# Patient Record
Sex: Female | Born: 1940 | Race: White | Hispanic: No | Smoking: Never smoker
Health system: Southern US, Community
[De-identification: ages and names within clinical notes are randomized; demographics above are authoritative.]

## PROBLEM LIST (undated history)

## (undated) DIAGNOSIS — M1990 Unspecified osteoarthritis, unspecified site: Secondary | ICD-10-CM

## (undated) DIAGNOSIS — E785 Hyperlipidemia, unspecified: Secondary | ICD-10-CM

## (undated) DIAGNOSIS — I480 Paroxysmal atrial fibrillation: Secondary | ICD-10-CM

## (undated) DIAGNOSIS — I2510 Atherosclerotic heart disease of native coronary artery without angina pectoris: Secondary | ICD-10-CM

## (undated) DIAGNOSIS — G4730 Sleep apnea, unspecified: Secondary | ICD-10-CM

## (undated) DIAGNOSIS — I739 Peripheral vascular disease, unspecified: Secondary | ICD-10-CM

## (undated) DIAGNOSIS — I10 Essential (primary) hypertension: Secondary | ICD-10-CM

## (undated) DIAGNOSIS — J189 Pneumonia, unspecified organism: Secondary | ICD-10-CM

## (undated) HISTORY — DX: Paroxysmal atrial fibrillation: I48.0

## (undated) HISTORY — DX: Peripheral vascular disease, unspecified: I73.9

## (undated) HISTORY — PX: CARDIAC CATHETERIZATION: SHX172

## (undated) HISTORY — DX: Atherosclerotic heart disease of native coronary artery without angina pectoris: I25.10

---

## 1996-06-18 HISTORY — PX: ABDOMINAL HYSTERECTOMY: SHX81

## 2000-12-17 ENCOUNTER — Inpatient Hospital Stay (HOSPITAL_COMMUNITY): Admission: AD | Admit: 2000-12-17 | Discharge: 2000-12-28 | Payer: Self-pay | Admitting: Family Medicine

## 2000-12-18 ENCOUNTER — Encounter (INDEPENDENT_AMBULATORY_CARE_PROVIDER_SITE_OTHER): Payer: Self-pay | Admitting: Specialist

## 2000-12-23 ENCOUNTER — Encounter: Payer: Self-pay | Admitting: Thoracic Surgery (Cardiothoracic Vascular Surgery)

## 2000-12-23 HISTORY — PX: CORONARY ARTERY BYPASS GRAFT: SHX141

## 2000-12-24 ENCOUNTER — Encounter: Payer: Self-pay | Admitting: Thoracic Surgery (Cardiothoracic Vascular Surgery)

## 2000-12-25 ENCOUNTER — Encounter: Payer: Self-pay | Admitting: Thoracic Surgery (Cardiothoracic Vascular Surgery)

## 2000-12-26 ENCOUNTER — Encounter: Payer: Self-pay | Admitting: Thoracic Surgery (Cardiothoracic Vascular Surgery)

## 2000-12-27 ENCOUNTER — Encounter: Payer: Self-pay | Admitting: Thoracic Surgery (Cardiothoracic Vascular Surgery)

## 2001-01-08 ENCOUNTER — Encounter: Payer: Self-pay | Admitting: Cardiology

## 2001-01-08 ENCOUNTER — Ambulatory Visit (HOSPITAL_COMMUNITY): Admission: RE | Admit: 2001-01-08 | Discharge: 2001-01-08 | Payer: Self-pay | Admitting: Cardiology

## 2001-01-24 ENCOUNTER — Encounter: Payer: Self-pay | Admitting: Family Medicine

## 2001-01-24 ENCOUNTER — Ambulatory Visit (HOSPITAL_COMMUNITY): Admission: RE | Admit: 2001-01-24 | Discharge: 2001-01-24 | Payer: Self-pay | Admitting: Family Medicine

## 2001-01-29 ENCOUNTER — Encounter
Admission: RE | Admit: 2001-01-29 | Discharge: 2001-01-29 | Payer: Self-pay | Admitting: Thoracic Surgery (Cardiothoracic Vascular Surgery)

## 2001-01-29 ENCOUNTER — Encounter: Payer: Self-pay | Admitting: Thoracic Surgery (Cardiothoracic Vascular Surgery)

## 2001-07-22 ENCOUNTER — Encounter: Payer: Self-pay | Admitting: Family Medicine

## 2001-07-22 ENCOUNTER — Encounter: Admission: RE | Admit: 2001-07-22 | Discharge: 2001-07-22 | Payer: Self-pay | Admitting: Family Medicine

## 2002-02-11 ENCOUNTER — Encounter: Payer: Self-pay | Admitting: Vascular Surgery

## 2002-02-13 ENCOUNTER — Ambulatory Visit (HOSPITAL_COMMUNITY): Admission: RE | Admit: 2002-02-13 | Discharge: 2002-02-14 | Payer: Self-pay | Admitting: Vascular Surgery

## 2002-04-16 ENCOUNTER — Encounter: Payer: Self-pay | Admitting: Family Medicine

## 2002-04-16 ENCOUNTER — Ambulatory Visit (HOSPITAL_COMMUNITY): Admission: RE | Admit: 2002-04-16 | Discharge: 2002-04-16 | Payer: Self-pay | Admitting: Family Medicine

## 2003-05-04 ENCOUNTER — Ambulatory Visit (HOSPITAL_COMMUNITY): Admission: RE | Admit: 2003-05-04 | Discharge: 2003-05-04 | Payer: Self-pay | Admitting: Family Medicine

## 2003-10-29 ENCOUNTER — Emergency Department (HOSPITAL_COMMUNITY): Admission: EM | Admit: 2003-10-29 | Discharge: 2003-10-29 | Payer: Self-pay | Admitting: *Deleted

## 2004-10-26 ENCOUNTER — Ambulatory Visit (HOSPITAL_COMMUNITY): Admission: RE | Admit: 2004-10-26 | Discharge: 2004-10-26 | Payer: Self-pay | Admitting: Family Medicine

## 2004-11-22 ENCOUNTER — Ambulatory Visit: Payer: Self-pay | Admitting: Cardiology

## 2004-12-01 ENCOUNTER — Ambulatory Visit (HOSPITAL_COMMUNITY): Admission: RE | Admit: 2004-12-01 | Discharge: 2004-12-01 | Payer: Self-pay | Admitting: Cardiology

## 2004-12-01 ENCOUNTER — Ambulatory Visit: Payer: Self-pay | Admitting: Cardiology

## 2004-12-22 ENCOUNTER — Ambulatory Visit: Payer: Self-pay | Admitting: Cardiology

## 2005-02-06 ENCOUNTER — Ambulatory Visit: Payer: Self-pay | Admitting: Cardiology

## 2005-07-02 ENCOUNTER — Ambulatory Visit (HOSPITAL_COMMUNITY): Admission: RE | Admit: 2005-07-02 | Discharge: 2005-07-02 | Payer: Self-pay | Admitting: Family Medicine

## 2005-08-29 ENCOUNTER — Emergency Department (HOSPITAL_COMMUNITY): Admission: EM | Admit: 2005-08-29 | Discharge: 2005-08-29 | Payer: Self-pay | Admitting: Emergency Medicine

## 2006-07-29 ENCOUNTER — Ambulatory Visit (HOSPITAL_COMMUNITY): Admission: RE | Admit: 2006-07-29 | Discharge: 2006-07-29 | Payer: Self-pay | Admitting: Family Medicine

## 2007-08-11 ENCOUNTER — Ambulatory Visit (HOSPITAL_COMMUNITY): Admission: RE | Admit: 2007-08-11 | Discharge: 2007-08-11 | Payer: Self-pay | Admitting: Family Medicine

## 2007-09-13 ENCOUNTER — Ambulatory Visit: Payer: Self-pay | Admitting: Cardiology

## 2007-09-13 ENCOUNTER — Encounter: Payer: Self-pay | Admitting: Emergency Medicine

## 2007-09-13 ENCOUNTER — Ambulatory Visit: Payer: Self-pay | Admitting: *Deleted

## 2007-09-14 ENCOUNTER — Inpatient Hospital Stay (HOSPITAL_COMMUNITY): Admission: RE | Admit: 2007-09-14 | Discharge: 2007-09-14 | Payer: Self-pay | Admitting: Emergency Medicine

## 2007-09-30 ENCOUNTER — Ambulatory Visit: Payer: Self-pay | Admitting: Cardiology

## 2007-09-30 ENCOUNTER — Encounter (HOSPITAL_COMMUNITY): Admission: RE | Admit: 2007-09-30 | Discharge: 2007-10-30 | Payer: Self-pay | Admitting: Internal Medicine

## 2007-11-03 ENCOUNTER — Ambulatory Visit: Payer: Self-pay | Admitting: Internal Medicine

## 2007-11-19 ENCOUNTER — Ambulatory Visit (HOSPITAL_COMMUNITY): Admission: RE | Admit: 2007-11-19 | Discharge: 2007-11-19 | Payer: Self-pay | Admitting: Family Medicine

## 2008-07-30 ENCOUNTER — Ambulatory Visit: Payer: Self-pay | Admitting: Internal Medicine

## 2008-07-30 LAB — CONVERTED CEMR LAB
BUN: 18 mg/dL (ref 6–23)
CO2: 36 meq/L — ABNORMAL HIGH (ref 19–32)
Calcium: 8.9 mg/dL (ref 8.4–10.5)
Chloride: 100 meq/L (ref 96–112)
Creatinine, Ser: 0.8 mg/dL (ref 0.4–1.2)
GFR calc Af Amer: 92 mL/min
GFR calc non Af Amer: 76 mL/min
Glucose, Bld: 121 mg/dL — ABNORMAL HIGH (ref 70–99)
Potassium: 3.3 meq/L — ABNORMAL LOW (ref 3.5–5.1)
Sodium: 141 meq/L (ref 135–145)

## 2008-08-03 ENCOUNTER — Ambulatory Visit (HOSPITAL_COMMUNITY): Admission: RE | Admit: 2008-08-03 | Discharge: 2008-08-03 | Payer: Self-pay | Admitting: Internal Medicine

## 2008-08-09 ENCOUNTER — Ambulatory Visit: Admission: EM | Admit: 2008-08-09 | Discharge: 2008-08-09 | Payer: Self-pay | Admitting: Emergency Medicine

## 2008-08-09 ENCOUNTER — Encounter: Payer: Self-pay | Admitting: Pulmonary Disease

## 2008-08-16 ENCOUNTER — Ambulatory Visit (HOSPITAL_COMMUNITY): Admission: RE | Admit: 2008-08-16 | Discharge: 2008-08-16 | Payer: Self-pay | Admitting: Family Medicine

## 2008-09-10 ENCOUNTER — Ambulatory Visit: Payer: Self-pay | Admitting: Pulmonary Disease

## 2008-09-10 DIAGNOSIS — G4733 Obstructive sleep apnea (adult) (pediatric): Secondary | ICD-10-CM | POA: Insufficient documentation

## 2009-03-14 ENCOUNTER — Ambulatory Visit: Payer: Self-pay | Admitting: Pulmonary Disease

## 2009-08-05 ENCOUNTER — Ambulatory Visit (HOSPITAL_COMMUNITY): Admission: RE | Admit: 2009-08-05 | Discharge: 2009-08-05 | Payer: Self-pay | Admitting: Family Medicine

## 2009-09-13 ENCOUNTER — Ambulatory Visit (HOSPITAL_COMMUNITY)
Admission: RE | Admit: 2009-09-13 | Discharge: 2009-09-13 | Payer: Self-pay | Source: Home / Self Care | Admitting: Family Medicine

## 2010-07-04 ENCOUNTER — Ambulatory Visit (HOSPITAL_COMMUNITY)
Admission: RE | Admit: 2010-07-04 | Discharge: 2010-07-04 | Payer: Self-pay | Source: Home / Self Care | Attending: General Surgery | Admitting: General Surgery

## 2010-07-06 NOTE — Op Note (Signed)
  [name], Monica Stafford             ACCOUNT NO.:  [id number]  MEDICAL RECORD NO.:  [id number]          PATIENT TYPE:  AMB  LOCATION:  DAY                           FACILITY:  [facility]  PHYSICIAN:  Dalia Heading, M.D.  DATE OF BIRTH:  November 19, 1940  DATE OF PROCEDURE:  07/04/2010 DATE OF DISCHARGE:                              OPERATIVE REPORT   PREOPERATIVE DIAGNOSIS:  Screening colonoscopy.  POSTOPERATIVE DIAGNOSIS:  Screening colonoscopy.  PROCEDURE:  Colonoscopy.  SURGEON:  Dalia Heading, MD  ANESTHESIA:  Demerol 50 mg IV, Versed 3 mg IV.  INDICATIONS:  The patient is a 70 year old white female who is referred for a screening colonoscopy.  Her last colonoscopy was over 10 years ago.  There is no family history of colon carcinoma.  The risks and benefits of the procedure including bleeding and perforation were fully explained to the patient, gave informed consent.  PROCEDURE NOTE:  The patient was placed in left lateral decubitus position after placement of monitored anesthesia equipment.  Demerol and Versed were used throughout the procedure.  Rectal examination was performed which was negative.  The colonoscope was advanced to the cecum without difficulty. Confirmation of placement to the cecum was done using transabdominal palpation landmarks.  The bowel preparation was adequate.  The cecum, ascending colon,, transverse colon, descending colon, sigmoid colon, and rectum were all within normal limits.  No abnormal lesions were noted. On retroflexion of the colonoscope, the dentate line was inspected and noted to be within normal limits.  The colonoscope was returned to the neutral position and all air was evacuated from the colon and rectum prior to removal of the colonoscope.  The patient tolerated the procedure well, was transferred back to day surgery in stable condition.  COMPLICATIONS:  None.  SPECIMEN:  None.  RECOMMENDATIONS:  A followup colonoscopy is  recommended in 10 years.     Dalia Heading, M.D.     MAJ/MEDQ  D:  07/04/2010  T:  07/04/2010  Job:  [id number]  cc:   Mila Homer. Sudie Bailey, M.D. Fax: [phone]  Electronically Signed by Franky Macho M.D. on 07/06/2010 12:14:16 PM

## 2010-07-18 NOTE — Assessment & Plan Note (Signed)
Summary: rov for osa   Copy to:  Dr. Dietrich Pates Primary Provider/Referring Provider:  Dr. Katharine Look  CC:  Pt is here for a 6 month f/u appt.  Pt has lost 10 lbs since her last visit.  Pt c/o difficulty falling and staying asleep and wakes up feeling 'tired."   .  History of Present Illness: The pt comes in today for f/u of her mild osa.  At the last visit, the decision was made to treat her osa conservatively with a trial of weight loss.  She has lost 10 pounds since her last visit, but doesn't feel that she is sleeping any better.  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  Current Medications (verified): 1)  Evista 60 Mg Tabs (Raloxifene Hcl) .... Once Daily 2)  Lasix 40 Mg Tabs (Furosemide) .... Once Daily 3)  Adult Aspirin Ec Low Strength 81 Mg Tbec (Aspirin) .... Once Daily 4)  Potassium 20 Mg .Marland Kitchen.. 20 Mg in Am Ands 10 Mg At Lunch and 20mg  At Pm 5)  Lisinopril 40 Mg Tabs (Lisinopril) .... Once Daily 6)  Simvastatin 40 Mg Tabs (Simvastatin) .... Qd 7)  Metoprolol Succinate 50 Mg Xr24h-Tab (Metoprolol Succinate) .... Once Daily 8)  Amlodipine Besylate 5 Mg Tabs (Amlodipine Besylate) .... Once Daily 9)  Calcium 600/vitamin D 600-400 Mg-Unit Chew (Calcium Carbonate-Vitamin D) .... Once Daily 10)  Anti-Oxidant  Tabs (Multiple Vitamin) .... Once Daily 11)  Triamterene-Hctz 75-50 Mg Tabs (Triamterene-Hctz) .... 1/2 Tab Every Other Day 12)  Nitrostat 0.4 Mg Subl (Nitroglycerin) .... Take By Mouth As Needed 13)  Fosamax 70 Mg Tabs (Alendronate Sodium) .... Take 1 Tab By Mouth Once A Week 14)  Naproxen 500 Mg Tabs (Naproxen) .... Take 1 Tab By Mouth Once A Week 15)  Lorazepam .... (Unsure of Dosage) Take By Mouth As Needed  Allergies (verified): 1)  ! Sulfa  Review of Systems      See HPI  Vital Signs:  Patient profile:   70 year old female Height:      68.5 inches Weight:       174.13 pounds BMI:     26.19 O2 Sat:      98 % on Room air Temp:     97.7 degrees F oral Pulse rate:   80 / minute BP sitting:   116 / 70  (left arm) Cuff size:   regular  Vitals Entered By: Arman Filter LPN (March 14, 2009 8:51 AM)  O2 Flow:  Room air CC: Pt is here for a 6 month f/u appt.  Pt has lost 10 lbs since her last visit.  Pt c/o difficulty falling and staying asleep and wakes up feeling 'tired."    Comments Medications reviewed with patient. Arman Filter LPN  March 14, 2009 8:51 AM    Physical Exam  General:  ow female in nad Neurologic:  alert, does not appear sleepy moves all 4.   Impression & Recommendations:  Problem # 1:  OBSTRUCTIVE SLEEP APNEA (ICD-327.23)  the pt has been successful with weight loss since the last visit.  I have asked her to continue with this, and let's see how her sleep does.  A lot of her complaints about her sleep currently are related more to insomnia than osa, and seem to be related to "mind racing".  Again, the severity of her sleep apnea is mild,  and contributes little risk to CV health.  I have asked her to call if she feels that she isn't improving with weight loss, or if she reaches a plateau with weight loss.  We can consider more aggressive treatment of her osa at that time if it is adversely affecting her QOL and we feel the problem truly is her osa.  Medications Added to Medication List This Visit: 1)  Nitrostat 0.4 Mg Subl (Nitroglycerin) .... Take by mouth as needed 2)  Fosamax 70 Mg Tabs (Alendronate sodium) .... Take 1 tab by mouth once a week 3)  Naproxen 500 Mg Tabs (Naproxen) .... Take 1 tab by mouth once a week 4)  Lorazepam  .... (unsure of dosage) take by mouth as needed  Other Orders: Est. Patient Level II (36644)  Patient Instructions: 1)  continue to work on weight loss 2)  followup as needed if not improving.

## 2010-10-11 ENCOUNTER — Ambulatory Visit (INDEPENDENT_AMBULATORY_CARE_PROVIDER_SITE_OTHER): Payer: Medicare Other | Admitting: Urology

## 2010-10-11 DIAGNOSIS — N3000 Acute cystitis without hematuria: Secondary | ICD-10-CM

## 2010-10-11 DIAGNOSIS — N952 Postmenopausal atrophic vaginitis: Secondary | ICD-10-CM

## 2010-10-11 DIAGNOSIS — N8111 Cystocele, midline: Secondary | ICD-10-CM

## 2010-10-16 ENCOUNTER — Other Ambulatory Visit (HOSPITAL_COMMUNITY): Payer: Self-pay | Admitting: Family Medicine

## 2010-10-16 DIAGNOSIS — Z139 Encounter for screening, unspecified: Secondary | ICD-10-CM

## 2010-10-20 ENCOUNTER — Ambulatory Visit (HOSPITAL_COMMUNITY): Admission: RE | Admit: 2010-10-20 | Payer: Medicare Other | Source: Ambulatory Visit

## 2010-10-31 NOTE — Procedures (Signed)
[name], Monica Stafford             ACCOUNT NO.:  [id number]   MEDICAL RECORD NO.:  [id number]          PATIENT TYPE:  OUT   LOCATION:  SLEE                          FACILITY:  [facility]   PHYSICIAN:  Kofi A. Gerilyn Pilgrim, M.D. DATE OF BIRTH:  [birth year]   DATE OF PROCEDURE:  DATE OF DISCHARGE:  08/09/2008                             SLEEP DISORDER REPORT   NOCTURNAL POLYSOMNOGRAPHY REPORT   REFERRING PHYSICIAN:  Pricilla Riffle, MD, [facility].   REASON FOR STUDY:  This is a 70 year old lady who presents with snoring  and fatigue.  She has been evaluated for possible obstructive sleep  apnea syndrome.   MEDICATIONS:  Metoprolol, aspirin, potassium, hydrochlorothiazide,  Lasix, Evista, and lisinopril.  Epworth sleepiness scale 3.  BMI 27.   ARCHITECTURAL SUMMARY ARCHITECTURAL:  This is a diagnostic study carried  out for 430 minutes.  The sleep efficiency is reduced at 64.  Sleep  latency of 30 minutes, REM latency 260 minutes.  Stage N1 8%, N2 48%, N3  26%, and REM sleep 18%.   RESPIRATORY SUMMARY:  Baseline oxygen saturation is 96 with the lowest  saturation 79.  The AHI is 15 occurring almost exclusively during REM  sleep with REM AHI being 67.  Most of the patient's events were  obstructive or mixed apneas during REM sleep.   LIMB MOVEMENT SUMMARY:  PLM index 0.   ELECTROCARDIOGRAM SUMMARY:  Average heart rate is 59 with no significant  dysrhythmias observed.   IMPRESSION:  1. Moderate obstructive sleep apnea syndrome.  The the patient did not      meet the criteria for a split-night study and therefore CPAP was      not administered.  2. Architectural abnormality with poor sleep efficiency and fragmented      sleep of unclear etiology.   RECOMMENDATIONS:  Formal CPAP titration study.   Thanks for this referral.      Kofi A. Gerilyn Pilgrim, M.D.  Electronically Signed     KAD/MEDQ  D:  08/11/2008  T:  08/11/2008  Job:  [id number]

## 2010-10-31 NOTE — Assessment & Plan Note (Signed)
[facility]                            CARDIOLOGY OFFICE NOTE   NAME:TOWNSENDAlyviah, Crandle                    MRN:          [id number]  DATE:07/30/2008                            DOB:          June 11, 1941    IDENTIFICATION:  Ms. Fulbright is a 70 year old woman with a history of  CAD (status post CABG in 2002, LIMA to LAD; LIMA to RCA; SVG to  diagonal, SVG to OM1), CV disease (status post left carotid  endarterectomy (2002, 2003), dyslipidemia, mild renal artery stenosis,  and hypertension.  I last saw her in May.   Since seen, her breathing is okay.  She denies chest pain.  She does not  she snores quite a bit.   CURRENT MEDICATIONS:  1. Evista 60.  2. Lasix 40.  3. Aspirin 81.  4. Potassium 20 b.i.d.  5. Lisinopril 40.  6. Simvastatin 40.  7. Calcium 600 b.i.d.  8. Multivitamin.  9. Triamterene hydrochlorothiazide one-half of the 75/25 every other      day.  10.Metoprolol 50 b.i.d.  11.Amlodipine 5.   PHYSICAL EXAMINATION:  GENERAL:  The patient is in no distress at rest.  VITAL SIGNS:  Blood pressure is 153/68 and on my check 144/68, pulse 53  and regular.  NECK:  No JVD.  Left carotid endarterectomy scar.  No bruits.  LUNGS:  Clear without rales or wheezes.  CARDIAC:  Regular rate and rhythm.  S1 and S2.  No S3.  No significant  murmurs.  ABDOMEN:  Benign.  EXTREMITIES:  Good pulses.  No edema.   IMPRESSION:  1. Coronary artery disease.  No signs of active ischemia.  We would      continue.  2. Hypertension.  She is on so many medicines.  I wonder if indeed she      has a cold sleep apnea to explain last evaluation of her renal      arteries around 2006 showed mild stenosis.  I am not convinced that      they changed.  We will check a BMET today.  3. Cardiovascular disease.  We will set up for carotid ultrasound.  4. Dyslipidemia.  We will need to check on fasting lipid panel with      her primary.   I will be in touch with the  patient regarding test results and set to  see otherwise in 6 months' time.     Pricilla Riffle, MD, [facility]  Electronically Signed    PVR/MedQ  DD: 07/30/2008  DT: 07/31/2008  Job #: [id number]

## 2010-10-31 NOTE — Discharge Summary (Signed)
[name], Monica Stafford NO.:  [id number]   MEDICAL RECORD NO.:  [id number]          PATIENT TYPE:  INP   LOCATION:  2006                         FACILITY:  [facility]   PHYSICIAN:  Doylene Canning. Ladona Ridgel, MD    DATE OF BIRTH:  March 26, 1941   DATE OF ADMISSION:  09/14/2007  DATE OF DISCHARGE:  09/14/2007                               DISCHARGE SUMMARY   PRIMARY CARDIOLOGIST:  Dr. Pricilla Riffle, MD, [facility].   DISCHARGE DIAGNOSES:  1. Chest pain with right arm pain negative for myocardial infarction      on this admission.  2. Hypokalemia treated with supplemental p.o. potassium.   PAST MEDICAL HISTORY:  Includes:  1. Coronary artery disease, status post CABG in 2002.  2. Hyperlipidemia.  3. Left carotid enterectomy with a right carotid stenosis of 40%.   HOSPITAL COURSE:  Ms. Credeur is a 70 year old female with past medical  history as stated above.  The patient was admitted for observation with  negative cardiac enzymes and no EKG changes suggestive of ischemic  event.  The patient was seen by Dr. Lewayne Bunting __________  prior to  discharge.  The patient ambulated without complaints of discomfort and  was discharged home with instructions to follow up with Dr. Tenny Craw in  [place] with office call and the patient at home to schedule  appointment.   DISCHARGE MEDICATIONS:  At the time of discharge medications include  nitroglycerin as needed, furosemide 40 mg daily, Evista 60 mg daily,  aspirin 81 daily, __________ , HCTZ triamterene 25/37.5 mg daily,  amlodipine 10 mg daily, metoprolol 50 mg b.i.d., simvastatin 40 mg  daily.   Duration of discharge, less than 30 minutes.      Dorian Pod, ACNP      Doylene Canning. Ladona Ridgel, MD  Electronically Signed    MB/MEDQ  D:  10/07/2007  T:  10/08/2007  Job:  [id number]

## 2010-10-31 NOTE — Assessment & Plan Note (Signed)
[facility]                        CARDIOLOGY OFFICE NOTE   NAME:Monica Stafford, [place]                    MRN:          [id number]  DATE:11/03/2007                            DOB:          [birth year]    IDENTIFICATION:  [name] is a 70 year old woman (today). She has a  history of CAD, mild renal artery stenosis and hypertension.  She was  last seen in cardiology clinic by Dr. Geralynn Rile actually in 2006.   The patient was just discharged in March from the hospital. She was  admitted for arm pain.   The patient was set up for an outpatient Myoview. This was done April 14  and showed normal perfusion, EF of 76%, hypertensive response.   The patient has not had any recurrence of her right arm pain.  She said  before she was admitted, she actually had been doing some painting.   She is active.  No chest pain or shortness of breath.   CURRENT MEDICATIONS:  1. Evista 60.  2. Lasix 40.  3. Aspirin 81.  4. Potassium 20 3 times a day.  5. Lisinopril 40.  6. Simvastatin 40.  7. Metoprolol 50.  8. Amlodipine 10.  9. Calcium b.i.d.  10.Multivitamin.  11.Triamterene/hydrochlorothiazide 75/50.   PAST MEDICAL HISTORY:  1. CAD status post CABG in 2002 (see LIMA to LAD; RIMA to RCA; SVG to      diagonal; SVG to OM-1).  2. Status post left carotid endarterectomy in 2002.  3. Mild right carotid stenosis 40%,  4. Dyslipidemia.  5. Hypertension.   PHYSICAL EXAMINATION:  GENERAL:  The patient is in no acute distress.  VITAL SIGNS:  Blood pressure is 150/82, usually at home 130-138/60-68.  Pulse is 64 and regular.  Weight 176, down from 181 in 2006.  LUNGS:  Clear.  NECK:  No audible bruits.  CARDIAC:  Regular rate and rhythm, S1-S2. No S3, no murmurs.  ABDOMEN:  Benign.  EXTREMITIES:  No edema.   IMPRESSION:  1. Chest pain.  It am not convinced it is cardiac.  Most likely      related to the painting. Again, Myoview scan looked good.  2. Cerebrovascular disease.  I will need to get a followup carotid      scan.  She had been turned loose a couple of years ago by Dr.      Hart Rochester, but she does have residual stenosis.  3. Dyslipidemia.  We will follow up on her lipids.   I will set up to see the patient back in 9 months, sooner if problems  develop. Encouraged her to stay active.     Pricilla Riffle, MD, [facility]  Electronically Signed    PVR/MedQ  DD: 11/03/2007  DT: 11/03/2007  Job #: [id number]

## 2010-10-31 NOTE — H&P (Signed)
[name], Stafford NO.:  [id number]   MEDICAL RECORD NO.:  [id number]          PATIENT TYPE:  INP   LOCATION:  2006                         FACILITY:  [facility]   PHYSICIAN:  Unice Cobble, MD     DATE OF BIRTH:  1941-03-30   DATE OF ADMISSION:  09/14/2007  DATE OF DISCHARGE:                              HISTORY & PHYSICAL   CHIEF COMPLAINT:  Chest pain.   HISTORY OF PRESENT ILLNESS:  This is a 70 year old white female with a  history of hyperlipidemia, hypertension, and CABG done in 2002 who  presents with right arm pain.  The patient was awakened from sleep on  Wednesday with right arm pain.  The pain was throbbing and radiated from  her mid arm down to her fingertips.  It was not associated with other  symptoms.  It lasted approximately 2 hours.  Today she was awakened with  the same complaint at about 4 a.m. and the pain has lasted all day.  It  is a throbbing pain with waxing and waning and has not responded to any  medications that she has been taking.  There is no shortness of breath,  chest pain, palpitations, presyncope or diaphoresis.  She denies edema,  orthopnea or PND.  During her original presentation prior to CABG she  had shortness of breath without chest pain.  She is currently walking  and leads an active lifestyle without this symptom.   PAST MEDICAL HISTORY:  1. Coronary artery bypass grafting July 2002:  Grafts include LIMA to      LAD, RIMA to RCA, saphenous venous graft to diagonal, saphenous      venous graft to first obtuse marginal.  2. Left carotid endarterectomy July 2002.  3. Right carotid stenosis 40%.  4. Hyperlipidemia.  5. Hypertension.   ALLERGIES:  SULFA causes hives.   MEDICATIONS:  1. Lasix 40 mg daily.  2. Evista 60 mg daily.  3. Aspirin 81 mg daily.  4. Potassium chloride 20 mEq t.i.d.  5. Simvastatin 40 mg daily.  6. Metoprolol tartrate 50 mg b.i.d.  7. Amlodipine 10 mg daily.  8. Calcium 600 mg b.i.d.  9.  Triamterene hydrochlorothiazide 25 mg daily.  10.Naproxen 500 mg b.i.d. p.r.n.   SOCIAL HISTORY:  She lives in [place] alone.  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  She does not smoke, drink or use drugs.   FAMILY HISTORY:  Her mother had bypass surgery in her 82s and her father  died of a stroke in his 55s.   REVIEW OF SYSTEMS:  Complete review of systems done, found to be  otherwise negative except as stated in the HPI.   PHYSICAL EXAMINATION:  Temperature is 98.2 with a pulse of 56,  respiratory rate is 18 with a blood pressure of 165/76, O2 saturations  are 98% on 2 liters.  GENERAL:  She is in no acute distress and overweight.  HEENT:  Shows PERRLA, EOMI, MMM.  Dentition is good.  Oropharynx without  erythema or exudates.  NECK:  Supple without lymphadenopathy, thyromegaly, bruits or  jugular  venous distention.  HEART:  Has a regular rate and rhythm with a normal S1 and S2.  No  murmurs, gallops or rubs.  PMI is nondisplaced.  Pulses are 2+ and equal  bilaterally without bruits.  LUNGS:  Clear to auscultation bilaterally.  SKIN:  Without rashes or lesions.  ABDOMEN:  Soft and nontender with normal bowel sounds.  No rebound or  guarding.  Negative hepatosplenomegaly or masses.  EXTREMITIES:  Show no cyanosis, clubbing or edema.  MUSCULOSKELETAL:  Shows no joint deformity or effusions.  No spine or  costovertebral angle tenderness.  NEUROLOGICALLY:  She is alert and oriented x3 with cranial nerves II-XII  grossly intact.  Strength is 5/5 in all extremities and axial groups.  Normal sensation throughout.   RADIOLOGY:  The read of the chest x-ray done at [facility] showed  cardiomegaly with interstitial scarring.  My read of the EKG shows a  rate of 52 in normal sinus rhythm without ST or T-wave changes.  First-  degree AV block is present.   LABORATORY VALUES:  Show a potassium of 2.9 with a creatinine of 1.0.  Her glucose is 152.  CK-MB is 1.1 with a  troponin of less than 0.05.   ASSESSMENT AND PLAN:  This is a 70 year old white female with a history  of hyperlipidemia and hypertension and four-vessel coronary artery  bypass grafting in 2002 who presents with atypical chest pain.  Her  right arm pain is without troponin elevation or ECG changes and I  suspect this is musculoskeletal in origin.  As she is female and has had  bypass surgery I will rule her out for myocardial infarction overnight  with serial enzymes and EKGs.  If they are negative x3 then she may  follow up for a cardiac stress test versus catheterization depending on  attending physician preference.  Otherwise, I will continue her home  medications.  Her potassium has been repleted at [facility], but this  will need to be followed as well.      Unice Cobble, MD  Electronically Signed     ACJ/MEDQ  D:  09/14/2007  T:  09/14/2007  Job:  [id number]

## 2010-10-31 NOTE — Procedures (Signed)
Monica Stafford, Monica Stafford             ACCOUNT NO.:  [id number]   MEDICAL RECORD NO.:  [id number]          PATIENT TYPE:  REC   LOCATION:  RAD                           FACILITY:  [facility]   PHYSICIAN:  Madolyn Frieze. Jens Som, MD, FACCDATE OF BIRTH:  1941/03/11   DATE OF PROCEDURE:  DATE OF DISCHARGE:                                  STRESS TEST   Monica Stafford is a 70 year old female with past medical history of  coronary disease who is recently admitted to [facility] with  atypical chest pain.  She apparently ruled out for myocardial infarction  and was arranged to have an outpatient Myoview for risk stratification.  This will be the exercise portion of that study.   The patient exercised for 8 minutes and 31 seconds on the Bruce  protocol.  Her heart rate increased from resting of 57 to a maximum of  141, which was 92% of the predicted maximum.  Her blood pressure at rest  is 148/78 and increased to 198/88, which was felt to be a normal  response.  There was no chest pain during the study.  Note, there was  significant artifact with the ECG tracings making that portion of the  study difficult to interpret.  However, there did not appear to be  significant ST changes with activity.  Note, there was downsloping ST  depression of less than 1-mm in recovery that was felt to be  nondiagnostic.   Exercise treadmill with no chest pain and no diagnostic  electrocardiographic changes.  The scintigraphic results are pending at  the time of this dictation.      Madolyn Frieze Jens Som, MD, [facility]  Electronically Signed     BSC/MEDQ  D:  09/30/2007  T:  10/01/2007  Job:  [id number]

## 2010-11-03 NOTE — Op Note (Signed)
NAME:  Monica Stafford, Monica Stafford                       ACCOUNT NO.:  [id number]   MEDICAL RECORD NO.:  [id number]                   PATIENT TYPE:  OIB   LOCATION:  3301                                 FACILITY:  [facility]   PHYSICIAN:  Quita Skye. Hart Rochester, M.D.               DATE OF BIRTH:  01/23/1941   DATE OF PROCEDURE:  DATE OF DISCHARGE:  02/14/2002                                 OPERATIVE REPORT   PREOPERATIVE DIAGNOSES:  Draining sinus, left neck incision, one-year status  post left carotid endarterectomy.   POSTOPERATIVE DIAGNOSES:  Draining sinus tract involving Dacron patch of  left carotid endarterectomy site.   PROCEDURE:  1. Exploration of sinus tract, left neck.  2. Removal of Dacron patch of left common and internal carotid artery.  3. Insertion of a greater saphenous vein patch, left common and internal     carotid artery from left leg.   SURGEON:  Dr. Hart Rochester.   FIRST ASSISTANT:  Dr. Cari Caraway.   SECOND ASSISTANT:  Will Marlyne Beards.   ANESTHESIA:  General endotracheal.   BRIEF HISTORY:  This patient underwent a left carotid endarterectomy in July  of 2002.  She has been doing well, but several months ago began having some  drainage from a small sinus tract in the mid-portion of her left neck wound.  There was no evidence of pseudo-aneurysm or flow reduction on new plate  scanning, and this had been treated with antibiotics by her local medical  doctor.  She was admitted today for exploration of this wound to see if it  involved the patch or was an infected suture.   PROCEDURE:  The patient was taken to the operating room and placed in a  supine position.  There was satisfactory prepping and draping.  A left  __________ was performed.  After infiltration of 1% Xylocaine, a short  incision was made both proximal and distal to the sinus tract, through the  tract.  It was in the mid-portion of the left neck wound from a carotid  endarterectomy.  Section carried down along  the anterior border of the  sternocleidomastoid muscle to the depth of the wound, and Dacron patch was  then visible with the tract extending right down to this area.  There was no  purulence on the patch and did not appear infected, but I presumed that this  was infected since it had been draining.  Therefore, satisfactory general  endotracheal anesthesia was administered.  The left leg was prepped with  Betadine solution.  The greater saphenous vein harvested from the left groin  for a distance of about 8 to 10 cm.  This branch was ligated with 4 and 5-0  silk ties and divided.  It was removed gently and dilated with heparin and  saline and marked for orientation purposes.  Left neck incision was then  extended proximally and distally throughout the previous incision and  control  of the common carotid artery proximally was obtained.  Then using a  15 blade, the carotid was dissected out, being careful not to injure the  vagus or hypoglossal nerves, both of which were exposed.  The internal and  external carotid arteries were controlled distally.  The patch had a  contracted, scarred appearance with some wrinkling on the anterior aspect.  After this was all controlled, 6000 units of heparin given intravenously.  Carotid was occluded proximally and distally.  The vascular plants  longitudinal opening made in the patch.  There was evidence of  atherosclerosis or significant narrowing.  The artery was extended totally  through the patch proximally and distally, and a #10 shunt was inserted, re-  establishing flow in about 3 minutes.  The patch was completely excised from  the artery, and there was a relatively smooth surface remaining.  The  greater saphenous vein was inserted on as a patch, and was continued on 6-0  Prolene.  Prior to completion, the shunt was removed in following retrograde  and integrated flushing, the closure was completed through establishment of  flow in the external  and of the internal branch.  There was excellent flow  by Doppler.  Protamine was given to rush the heparin.  Following adequate  hemostasis.  Wound was irrigated with saline.  Closed and ligated with  Vicryl in subcuticular fashion, both the neck and the left leg.  Sterile  dressing applied.  Patient taken to the recovery room in satisfactory  condition.                                               Quita Skye Hart Rochester, M.D.    JDL/MEDQ  D:  02/13/2002  T:  02/16/2002  Job:  [id number]

## 2010-11-03 NOTE — Cardiovascular Report (Signed)
[place]. [facility]  Patient:    Monica Stafford, Monica Stafford                    MRN: [id number] Proc. Date: 12/20/00 Adm. Date:  [id number] Attending:  Pricilla Riffle CC:         John Giovanni, M.D.  Cardiac Catheterization Laboratory   Cardiac Catheterization  INDICATIONS:  Ms. [name] is a delightful 70 year old who has had progressive exertional angina.  The current study was done to assess coronary anatomy.  PROCEDURES PERFORMED: 1. Left heart catheterization. 2. Selective coronary arteriography. 3. Selective left ventriculography. 4. Subclavian angiography.  DESCRIPTION OF PROCEDURE:  The procedure was performed from the right femoral artery using 6-French catheters.  She tolerated the procedure well without complications.  She did have elevated blood pressures.  She was given two doses of labetalol 20 mg IV, plus an additional dose of 5 mg of intravenous metoprolol.  There were no complications from the procedure and she was taken to the holding area in satisfactory clinical condition.  HEMODYNAMIC DATA:  Central aortic pressure was 190/83.  LV pressure was 190/20.  There was no gradient on pullback across the aortic valve.  ANGIOGRAPHIC DATA: 1. Left ventriculography was performed in the RAO projection.  Overall    ventricular systolic function was normal.  No segmental abnormalities    or contraction were identified.  There did not appear to be significant    mitral regurgitation.  Ejection fraction was calculated at 69%. 2. Subclavian angiography revealed a patent subclavian artery, as well as    an internal mammary artery.  The internal mammary appeared to be suitable    for grafting. 3. The left main coronary artery was free of critical disease. 4. The left anterior descending artery coursed to the apex where it wrapped    the apical tip.  The LAD had about a 50% area of narrowing beyond the    origin of the first diagonal.  The first diagonal  itself had about 50%    narrowing.  Distal to this, there was a segmental area of plaquing leading    into an approximate 90% stenosis just after the large anterolateral branch.    This anterolateral branch itself was a large caliber vessel that had about    80% proximal narrowing.  Both vessels appeared to be suitable for grafting. 5. The circumflex artery had mild plaquing of approximately 20% to 30% prior    to the first marginal.  The first marginal itself had about 50% ostial and    then a mid 60% stenosis.  This vessel also appeared to be suitable for    grafting. 6. The right coronary artery was a large dominant vessel providing a moderate    sized posterior descending and a very large posterolateral branch.  The    right coronary artery had a 20% to 30% narrowing in the proximal vessel.    In the mid vessel was a focal 90% stenosis.  CONCLUSIONS: 1. Preserved left ventricular function. 2. Patent internal mammary artery. 3. 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 4. Moderate stenosis of the circumflex artery. 5. 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  DISPOSITION:  The patient has three-vessel disease.  The right is suitable for intervention, but the LAD is a complex lesion with a high risk of target vessel revascularization characterized by a long lesion and small caliber  vessel.  Based on these risk factors and the multivessel disease, we likely will recommend revascularization surgery.  I will notify her primary care physician and we will get a surgical consultation.DD:  12/20/00 TD:  12/20/00 Job: [id number]

## 2010-11-03 NOTE — Op Note (Signed)
[place]. [facility]  Patient:    Monica Stafford, Monica Stafford                      MRN: [id number] Proc. Date: 12/23/00 Attending:  Quita Skye. Hart Rochester, M.D.                           Operative Report  PREOPERATIVE DIAGNOSIS:  Severe left internal carotid artery stenosis - asymptomatic and severe coronary artery disease.  POSTOPERATIVE DIAGNOSIS:  OPERATION:  A left carotid endarterectomy with Dacron patch angioplasty is Dr. Rodell Perna portion.  SURGEON:  Quita Skye. Hart Rochester, M.D.  FIRST ASSISTANT:  Salvatore Decent. Dorris Fetch, M.D.  SECOND ASSISTANT:  Loura Pardon, P.A.  ANESTHESIA:  General endotracheal anesthesia.  DESCRIPTION OF PROCEDURE:  The patient was taken to the operating room, placed in the supine position at which time satisfactory general endotracheal anesthesia was administered.  A Swan-Ganz catheter and radial arterial line was inserted by anesthesia and the left neck, chest, abdomen, and lower extremities prepped with Betadine scrubbing solution and draped in routine sterile manner.  Incision was made on the anterior aspect of the sternocleidomastoid muscle on the left neck, carried down through subcutaneous tissue and platysma using the Bovie. Common facial vein and external jugular vein was ligated with 3-0 silk ties and divided exposing the common internal and external carotid arteries. Care was taken not to enter the vagus or hypoglossal nerves, both of which were exposed. There was calcified atherosclerotic plaque at the carotid bifurcation extending up the internal carotid artery about 3 cm.  Distal vessel appeared normal.  A #10 shunt was prepared and the patient was heparinized.  The carotid vessels were occluded with vascular clamps.  Longitudinal opening made in the common carotid with 15 blade extending up the internal carotid with Potts scissors to a point distal to the disease.  The plaque was severely stenotic being at least 90% and the distal  vessel appeared normal. The #10 shunt was inserted without difficulty, reestablishing flow in about two minutes.  A standard endarterectomy was then performed using elevator and a Potts scissors with an eversion endarterectomy of the external carotid.  The plaque feathered off the distal internal carotid artery nicely not requiring any packing sutures.  The lumen was thoroughly irrigated with heparin and saline and all loose debris carefully removed and arteriotomy closed with patch using continuous 6-0 Prolene.  Prior to completion of the closure, the shunt was removed after about 25 minutes of shunt time following antegrade and retrograde flushing. The closure was completed with reestablishment of flow initially up the external and up the internal branch. Carotid was occluded for less than two minutes for removal of the shunt.  Protamine was not given at this point.  It was given at the conclusion of the coronary artery bypass grafting. The left neck was packed until the coronary artery bypass graft was completed and was closed in the usual fashion and a subcuticular fashion with a Jackson-Pratt drain draining the left neck.  The rest of the procedure will be dictated by Dr. Dorris Fetch. DD:  12/23/00 TD:  12/23/00 Job: [id number]

## 2010-11-03 NOTE — H&P (Signed)
NAME:  Monica Stafford, Monica Stafford                       ACCOUNT NO.:  [id number]   MEDICAL RECORD NO.:  [id number]                   PATIENT TYPE:  OIB   LOCATION:  3301                                 FACILITY:  [facility]   PHYSICIAN:  Quita Skye. Hart Rochester, M.D.               DATE OF BIRTH:  1940-07-27   DATE OF ADMISSION:  DATE OF DISCHARGE:  02/14/2002                                HISTORY & PHYSICAL   HISTORY OF PRESENT ILLNESS:  The patient is a 70 year old white female  medical patient of Dr. Butch Penny in [place], [place], [facility] [place].  The patient is status post  coronary bypass grafting times 4 by Dr. Dorris Fetch and left carotid  endarterectomy by Dr. Hart Rochester 12/28/00.  Postoperatively she has done well.  Her 6 month checkup her neck was healed.  During the interim she has  developed some drainage from the left neck incision site.  This has been  treated locally with antibiotics by Dr. Sudie Bailey.  Dr. Hart Rochester saw her at 12  month followup and at that time he found a lymphocele from the mid-portion  of her carotid endarterectomy site.  The site had not improved with local  antibiotic treatments and it was Dr. Candie Chroman opinion that she should  undergo exploration of the site.  It was his opinion that it was most likely  a suture abscess, but he was concerned that it would also extend down to the  Dacron patch.  The patient was admitted as an outpatient and taken to the  OR.  Under local anesthesia the site was explored.  At surgery it was found  to extend down the Dacron patch with patch at the base of the lymphocyte.  At that point the patient was given general anesthesia and once this was  completed the patient's neck was fully explored and the Dacron patch was  removed and sent for culture.  A vein graft patch was obtained from the left  thigh and the patient underwent vein patch angioplasty of the left carotid.  The patient tolerated the  procedure well and was returned to the recovery  room.  We now admit her for observation, local IV antibiotic therapy.  We  plan to keep the patient hospitalized until cultures are obtained and  ultimately discharge her home on appropriate cultures.   PAST MEDICAL HISTORY:  Hypertension.  She is status post coronary artery  bypass graft times 4 on 07/02 for 3 vessel coronary artery disease.  She has  a history of hyperlipidemia.   PAST SURGICAL HISTORY:  Left carotid endarterectomy with Dacron patch  angioplasty and coronary artery bypass grafting times 4 with left internal  mammary to the LAD, right internal mammary to the right coronary artery  saphenous vein graft to diagonal, and to the first obtuse marginal.   MEDICATIONS:  Lipitor 10 mg q.d., Evista 60 mg q.d., potassium  chloride 20  mEq q.d., Ecotrin 81 mg q.d., and multivitamin with iron one q.d., calcium  supplement one daily, Lopressor 50 mg q.d., hydrochlorothiazide 25 mg q.d.  and ibuprofen p.r.n.   ALLERGIES:  SULFA CAUSES HIVES.   REVIEW OF SYSTEMS, FAMILY HISTORY, AND SOCIAL HISTORY:  Deferred.  The  patient is waking up from anesthesia, currently having nausea and having  difficulty remembering much of anything.   PHYSICAL EXAMINATION:  VITALS:  Blood pressure 176/84, pulse 88 and regular.  She is in a sinus rhythm.  Respirations are unlabored.   HEAD:  Normocephalic.   EYES:  PERRLA.  EOMs intact.  Fundi were not visualized.   EARS, NOSE, THROAT, AND MOUTH:  Within normal limits.   NECK:  The left neck now has a dressing in place with the above noted  findings prior to dressing.   CHEST:  Clear to auscultation and percussion.   CARDIAC:  Regular rate and rhythm.  No murmurs, rubs, or gallops.   ABDOMEN:  Soft, nontender, positive bowel sounds, no palpable organomegaly,  no bruits.   GU/RECTAL:  Not performed or medically indicated.   EXTREMITIES:  No clubbing, cyanosis, or edema.  Pulses are +2 in the   brachial, carotids, femorals, popliteal, DP and PT are +1 and equal  bilaterally.   NEUROLOGIC:  Nonfocal postoperatively.   IMPRESSION:  1. 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   1. Status post coronary artery bypass grafting times 4, 07/02.   1. Hypertension.   1. Hyperlipidemia.   PLAN:  The patient was admitted now after repair of her carotid artery with  vein patch angioplasty and removal of her Dacron patch for antibiotics.  Plan to keep her hospitalized until her cultures are obtained and she is on  appropriate antibiotics.  Dr.  Hart Rochester anticipates discharging her home on  antibiotics also.     Eber Hong, P.A.                 Quita Skye Hart Rochester, M.D.    WDJ/MEDQ  D:  02/13/2002  T:  02/16/2002  Job:  [id number]

## 2010-11-03 NOTE — Discharge Summary (Signed)
   NAME:  Monica Stafford, Monica Stafford                       ACCOUNT NO.:  [id number]   MEDICAL RECORD NO.:  [id number]                   PATIENT TYPE:  OIB   LOCATION:  3301                                 FACILITY:  [facility]   PHYSICIAN:  Levin Erp. Steward, P.A.                DATE OF BIRTH:  1940-08-12   DATE OF ADMISSION:  02/13/2002  DATE OF DISCHARGE:                                 DISCHARGE SUMMARY   DATE OF BIRTH:  Apr 15, 1941   ADMISSION DIAGNOSIS:  Lymphocele left neck with stitch abscess.   DISCHARGE DIAGNOSIS:  Lymphocele left neck with stitch abscess.   HOSPITAL COURSE:  Monica Stafford was admitted to [facility] on February 03, 2002 secondary to lymphocele of Monica Stafford left neck. Because of this, Dr.  Hart Rochester saw and evaluated Monica Stafford as an outpatient. On the day of admission, Monica Stafford  underwent exploration of the left neck with removal of Dacron patch from  previous carotid endarterectomy and drain patch angioplasty of the common  carotid artery. No complications were noted during the procedure.  Postoperative hemoglobin and hematocrit were stable at 12.1 and 35.8. BUN  and creatinine were 14 and 0.9. Monica Stafford had no neurologic deficits. No dysphagia  or difficulty with incision postoperative. Monica Stafford was subsequently deemed  stable for discharge to home on postoperative day one.   ACTIVITY:  The patient was told to do no driving, strenuous activity, or  lifting heavy objects.   DIET:  Regular.   WOUND CARE:  The patient was told that Monica Stafford could shower and clean Monica Stafford  incisions with soap and water.   MEDICATIONS:  The patient was told that Monica Stafford could resume Monica Stafford home  medications which include Lipitor, Evista, potassium chloride, aspirin,  multivitamins, calcium, and Ibuprofen along with Metoprolol and HCTZ. Monica Stafford  was told that Monica Stafford could take Tylenol one to two tablets every four to six  hours as needed for pain.   DISPOSITION:  To home.   FOLLOW UP:  The patient will see Dr. Hart Rochester back at the  [facility] office in  approximately two weeks. The office will call to verify the time and date of  this appointment.                                               Levin Erp. Kirstie Peri.    BGS/MEDQ  D:  02/14/2002  T:  02/16/2002  Job:  [id number]   cc:   [facility] Office

## 2010-11-03 NOTE — Discharge Summary (Signed)
[place]. [facility]  Patient:    Monica Stafford, Monica Stafford                    MRN: [id number] Adm. Date:  [id number] Disc. Date: 12/28/00 Attending:  Charlett Lango Dictator:   Tollie Pizza. Thomasena Edis, P.A.-C. CC:         John Giovanni, M.D.  [facility], [place], [place]   Discharge Summary  PRIMARY CARE PHYSICIAN:  John Giovanni, M.D.  PRIMARY ADMITTING DIAGNOSES: 1. Severe three-vessel coronary artery disease. 2. Class II unstable angina.  SECONDARY DIAGNOSES: 1. Hypertension. 2. Hyperlipidemia. 3. Asymptomatic left internal carotid artery stenosis.  PROCEDURES PERFORMED: 1. Cardiac catheterization. 2. Coronary artery bypass grafting x 4 with the following grafts:  Left    internal mammary artery to the left anterior descending, right internal    mammary artery to right coronary artery, saphenous vein graft to diagonal,    saphenous vein graft to first obtuse marginal. 3. Left carotid endarterectomy with Dacron patch angioplasty.  HISTORY OF PRESENT ILLNESS:  The patient is a 70 year old female who was admitted on December 17, 2000, to [facility], complaining of intermittent chest tightness which had been occurring for approximately one month and was associated with diaphoresis and shortness of breath.  Her EKG and cardiac enzymes were within normal limits.  She was seen by [facility] at [facility] and was felt to warrant transfer to [facility]. [facility] for further evaluation and treatment.  HOSPITAL COURSE:  She was admitted to [facility] on December 18, 2000, and underwent cardiac catheterization which showed severe three-vessel coronary artery disease with good left ventricular function.  It was felt that she would benefit from surgical revascularization at this time. During the course of her preoperative work-up, she was also found to have greater than 80% left internal  carotid artery stenosis by Dopplers.  It was felt that she should proceed with carotid endarterectomy at the same time as her CABG.  She was taken to the operating room on December 23, 2000, and underwent CABG and left carotid endarterectomy.  She tolerated the procedures and was transferred to the SICU in stable condition.  She was extubated shortly after surgery.  On postoperative day #1, she was hemodynamically stable, neurologically intact, and doing well.  At that time, she was able to be transferred to the floor, however, no bed was available.  She was later transferred and has done well postoperatively.  She has remained neurologically intact.  She has been afebrile and all vital signs are have been stable.  All of her incisions are healing well at this time.  She has had a mild anemia with a hemoglobin of 7.8 and a hematocrit of 22.3, which has been entirely asymptomatic.  She was started on iron replacement therapy and has done well.  She has been several pounds over her preoperative weight during her admission and has been diuresed.  It will be determined prior to discharge whether she will need to continue diuretics at home.  It is felt that if she continues stable overnight, she may be able to be discharged home on December 28, 2000.  DISCHARGE MEDICATIONS:  1. Enteric-coated aspirin 325 mg daily.  2. Tylox one to two q.4h. p.r.n. for pain.  3. Lopressor 25 mg q.12h.  4. Niferex 150 mg one p.o. q.d.  5. Protonix 40 mg q.d.  6. Folic acid 1 mg q.d.  7. Colace  100 mg b.i.d.  8. Continue Lipitor at her home dose.  9. ______ at her home dose. 10. Continue Prinivil at her home dose.  ACTIVITY:  She is to continue daily walking and deep breathing, but is to refrain from heavy lifting of anything greater than 10 pounds.  No driving.  WOUND CARE:  She will clean her incisions with soap and water daily in the shower.  Call our office if she experiences any redness, swelling, or  drainage of the incisional areas.  DIET:  She will continue a low-fat, low-sodium diet.  FOLLOW-UP APPOINTMENTS:  She will see the [facility] in [place], [place], in two weeks and have a chest x-ray performed at that time.  She will then follow up with Viviann Spare C. Dorris Fetch, M.D., on January 22, 2001, at 9:45 a.m.  She will call our office if she has any problems in the interim. DD:  12/27/00 TD:  12/27/00 Job: [id number]

## 2010-11-03 NOTE — Op Note (Signed)
[place]  Patient:    CHARIDY, CAPPELLETTI                    MRN: [id number] Proc. Date: 12/23/00 Adm. Date:  [id number] Attending:  Charlett Lango CC:         Dr. Merryl Hacker, M.D. [facility]   Operative Report  PREOPERATIVE DIAGNOSIS:  Severe three-vessel disease with progressive angina, critical left internal carotid artery stenosis.  POSTOPERATIVE DIAGNOSIS:  Severe three-vessel disease with progressive critical left internal carotid artery stenosis.  PROCEDURES:  Median sternotomy, extracorporeal circulation, coronary artery bypass grafting x 4 (left internal mammary artery to the left anterior descending coronary artery, right internal mammary artery to distal right coronary, saphenous vein graft to obtuse marginal 1, saphenous vein graft to second diagonal).  SURGEON:  Salvatore Decent. Dorris Fetch, M.D.  ASSISTANT:  Maple Mirza, P.A.-C.  ANESTHESIA:  General.  FINDINGS:  Good-quality targets, good-quality conduits.  Preserved left ventricular function.  CLINICAL NOTE:  Ms. Tuley is a 70 year old female who presented with classic anginal symptoms.  These have become progressive over the past month. She underwent cardiac catheterization, which revealed severe three-vessel coronary disease with LAD disease which was not amenable to percutaneous intervention.  The patient was referred for coronary artery bypass grafting. The indications, risks, benefits, and alternatives were discussed in detail with the patient.  She understood and accepted the risks and agreed to proceed.  During her preoperative evaluation, she had carotid Dopplers performed, which revealed a tight left internal carotid artery stenosis.  She was seen in consultation by Larina Earthly, M.D., who recommended concomitant left carotid endarterectomy and arranged for Quita Skye. Hart Rochester, M.D., to perform the endarterectomy.  The patient understood the  reasons for combined procedure and accepted the risks.  DESCRIPTION OF PROCEDURE:  Ms. Velarde was brought to the preop holding area on December 23, 2000.  Lines were placed to monitor arterial, central venous, and pulmonary arterial pressure.  EKG leads were placed for continuous telemetry. The patient was taken to the operating room, anesthetized, and intubated.  A Foley catheter was placed.  Intravenous antibiotics were administered.  The neck, chest, abdomen, and legs were prepped and draped in the usual fashion.  Dr. Josephina Gip performed a left carotid endarterectomy.  Please refer to his separate operative note for details.  The wound was left packed and loosely reapproximated.  After completion of the carotid endarterectomy, a median sternotomy was performed.  The left internal mammary artery was harvested in the standard fashion.  The patient was given an additional 5000 units of heparin prior to dividing the distal end of the left mammary artery.  It was a relatively small vessel, 1.5 mm in diameter, but had excellent flow.  The left mammary was placed in a papaverine-soaked sponge and placed into the left pleural space.  The right internal mammary artery then was harvested in the same fashion. Again, it was relatively small but with excellent flow.  The patient was given the remainder of the full heparin dose prior to dividing the distal end of the right mammary artery.  Simultaneously, incisions were made in the medial aspect of the right leg, and the greater saphenous vein was harvested from the ankle to the midcalf.  The vein bifurcated and then became too small to use, and an additional segment of vein was harvested from the lower portion of the left leg.  The pericardium was opened.  The ascending  aorta was inspected and palpated. There was no palpable atherosclerotic disease.  The aorta was cannulated via concentric 2-0 Ethibond pledgeted pursestring sutures.  A  dual-stage venous cannula was placed via pursestring suture in the right atrial appendage. Cardiopulmonary bypass was instituted.  The patient was cooled to 32 degrees Celsius.  The coronary arteries were inspected, and anastomotic sites were chosen.  The conduits were inspected and cut to length.  A foam pad was placed in the pericardium to protect the left phrenic nerve.  A temperature probe was placed in the myocardial septum, and a cardioplegia cannula was placed in the ascending aorta.  The aorta was crossclamped, the left ventricle was emptied via the aortic root vent.  Cardiac arrest then was achieved with a combination of cold antegrade blood cardioplegia and topical iced saline.  After achieving a complete diastolic arrest and adequate septal cooling, the following distal anastomoses were performed:  First, a reversed saphenous vein graft was placed end-to-side to the first obtuse marginal branch of the left circumflex coronary artery.  This was a 1.5 mm good-quality target.  The vein was relatively small but good quality. The anastomosis was performed with a running 7-0 Prolene suture.  There was excellent flow through the graft.  Cardioplegia was administered.  There was a small leak near the toe, which was repaired with a single 7-0 Prolene suture.  Next, a reversed saphenous vein graft was placed end-to-side to the second diagonal branch of the LAD.  This was a 1.5 mm good-quality vessel.  The anastomosis was performed just before bifurcation into two 1 mm branches.  The vein graft again was of good quality.  The anastomosis was performed with a running 7-0 Prolene suture.  Again there was excellent flow through the graft. Cardioplegia was administered, and there was good hemostasis.  After administering additional cardioplegia down the vein grafts and the aortic root, the right internal mammary artery was brought through a window in the right side of the pericardium.  The  distal end was spatulated.  Two  fascial releasing incisions were made in the right mammary pedicle.  The distal right coronary was exposed.  It was a 2 mm good-quality vessel at the site of the anastomosis, which was just over the acute margin of the heart. There was a 99% stenosis proximal to the anastomosis and no significant disease distally.  The anastomosis was performed end-to-side with a running 8-0 Prolene suture.  At completion of the anastomosis, the bulldog clamp was removed from the mammary artery.  There was good hemostasis.  The bulldog clamp was replaced.  The mammary pedicle was tacked to the epicardial surface of the heart with 6-0 Prolene sutures.  Next, the left internal mammary artery was brought through a window in the pericardium anterior to the left phrenic nerve.  The distal end of the left mammary artery was spatulated.  It was anastomosed end-to-side to the distal LAD using running 8-0 Prolene suture.  The distal LAD was a 1.5 mm, good-quality target.  The mammary was a 1.5 mm good-quality conduit.  At the completion of the mammary to LAD anastomosis, the bulldog clamp was removed from the mammary artery.  Immediate and rapid septal rewarming was noted, and lidocaine was administered.  The mammary pedicle was tacked to the epicardial surface of the heart with 6-0 Prolene sutures.  The aortic crossclamp was removed.  The total crossclamp time was 47 minutes.  The bulldog clamp was also removed from the right internal  mammary artery.  A single defibrillation with 20 joules was required.  A partial occlusion clamp was placed on the ascending aorta, and the grafts were cut to length.  The proximal vein graft anastomoses were performed to 4.0 mm punch aortotomies with running 6-0 Prolene sutures.  At the completion of the final proximal anastomoses, the patient was placed in Trendelenburg position.  Air was allowed to vent as the partial clamp was removed  before tying the suture.  Air was then aspirated from the vein grafts, the bulldog clamps were removed, and flow was restored.  All proximal and distal anastomoses were inspected for hemostasis.  Epicardial pacing wires were placed on the right ventricle and the right atrium.  When the core temperature reached 37 degrees Celsius, the patient was weaned from cardiopulmonary bypass without difficulty.  Total bypass time was 101 minutes.  The patient was atrially paced for rate and on no inotropic support at the time of separation from bypass.  Initial cardiac index was 4 L/min. per sq m.  The patient remained hemodynamically stable throughout the postbypass period.  A test dose of protamine was administered and was well-tolerated.  The atrial and aortic cannulae were removed.  The remainder of the protamine was administered without incident.  The chest was irrigated with 1 L of normal saline containing 1 g of vancomycin.  Hemostasis was achieved.  Bilateral pleural tubes and two mediastinal chest tubes were placed through separate subcostal incisions, secured with #1 silk sutures.  The pericardium was reapproximated over the heart and came together easily without tension.  The sternum was closed with heavy-gauge interrupted stainless steel wires.  The pectoralis fascia was closed with a running #1 Vicryl suture.  The subcutaneous tissue was closed with a running 2-0 Vicryl suture, and the skin was closed with 3-0 Vicryl subcuticular suture.  The neck incision then was explored.  The packing was removed.  Hemostasis was achieved.  A #10 Blake drain was placed in the wound and brought out through a separate incision and secured with a 2-0 silk suture.  The wound then was closed in standard fashion in three layers with a 3-0 Vicryl subcuticular suture for skin closure.  All sponge, needle, and instrument counts were correct at the end of the procedure.  There were no  intraoperative complications.  The patient was taken from the operating room to the surgical intensive care unit intubated, in stable condition. DD:  12/23/00 TD:  12/24/00 Job: [id number]

## 2010-11-03 NOTE — Op Note (Signed)
[place]  Patient:    Monica Stafford, Monica Stafford                      MRN: [id number] Proc. Date: 12/23/00 Attending:  Quita Skye. Hart Rochester, M.D.                           Operative Report  PREOPERATIVE DIAGNOSES:  Severe left internal carotid stenosis-asymptomatic and severe coronary artery disease.  POSTOPERATIVE DIAGNOSES:  Severe left internal carotid stenosis-asymptomatic and severe coronary artery disease.  PROCEDURES:  Left carotid endarterectomy with Dacron patch angioplasty. Coronary artery bypass grafting dictated by Dr. Charlett Lango.  SURGEON:  Quita Skye. Hart Rochester, M.D.  FIRST ASSISTANT:  Salvatore Decent. Dorris Fetch, M.D.  SECOND ASSISTANT:  Loura Pardon, P.A.  ANESTHESIA:  General endotracheal. DD:  12/23/00 TD:  12/23/00 Job: [id number]

## 2010-11-03 NOTE — Op Note (Signed)
NAMESHEMIA, BEVEL             ACCOUNT NO.:  [id number]   MEDICAL RECORD NO.:  [id number]          PATIENT TYPE:  OIB   LOCATION:  2899                         FACILITY:  [facility]   PHYSICIAN:  Salvadore Farber, M.D. LHCDATE OF BIRTH:  12-26-1940   DATE OF PROCEDURE:  12/01/2004  DATE OF DISCHARGE:  12/01/2004                                 OPERATIVE REPORT   PROCEDURE:  Abdominal aortography, selective bilateral renal angiography.   INDICATIONS:  Ms. Kallen is a 70 year old woman with known  atherosclerosis, who presents with hypertension not controlled despite four  medications.  MRA suggests right renal artery stenosis.  She was referred  for renal angiography and possible revascularization.   PROCEDURAL TECHNIQUE:  Informed consent was obtained.  Under 1% lidocaine  local anesthesia, a 6 French sheath was placed in the right common femoral  artery using modified Seldinger technique.  A pigtail catheter was advanced  to the suprarenal abdominal aorta.  Abdominal aortography was performed by  power injection.  This demonstrated at least mild stenosis of the right  renal artery at its origin.  To better define the severity of the stenosis,  I proceeded to selective bilateral renal angiography.  Each renal artery was  then selectively engaged using a 5 [place] LIIMA catheter.  Angiography was  performed by hand injection.  This demonstrated only mild right renal artery  stenosis and no significant stenosis on the left.  The catheter was then  removed over a wire.  Finally, the arteriotomy was closed using a Star Close  device.  Complete hemostasis was obtained.  The patient was then transferred  to the holding room in stable condition, having tolerated the procedure  well.   COMPLICATIONS:  None.   FINDINGS:  1.  Abdominal aorta:  Very mild diffuse plaquing without stenosis.  The      proximal portion of the common iliac arteries are normal bilaterally.  2.  Right renal  artery:  Single vessel.  Proximal 30% stenosis.  Normal      renal parenchymal blush with a normal-sized kidney.  3.  Left renal artery:  Single vessel which bifurcates early.  It is      angiographically normal.  The renal parenchyma has a normal blush and is      normal-sized.   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  This is not  severe enough to be a contributor to her hypertension.  It appears that she  has simply difficult-to-control essential hypertension.  Will continue on  her prior medical regimen and add a clonidine patch.       WED/MEDQ  D:  12/01/2004  T:  12/02/2004  Job:  [id number]   cc:   Mila Homer. Sudie Bailey, M.D.  [address] [place], [place] [address]  Fax: [phone]

## 2010-11-07 ENCOUNTER — Ambulatory Visit (HOSPITAL_COMMUNITY)
Admission: RE | Admit: 2010-11-07 | Discharge: 2010-11-07 | Disposition: A | Discharge status: Home / Self Care | Payer: Medicare Other | Source: Ambulatory Visit | Attending: Family Medicine | Admitting: Family Medicine

## 2010-11-07 DIAGNOSIS — Z139 Encounter for screening, unspecified: Secondary | ICD-10-CM

## 2010-11-07 DIAGNOSIS — Z1231 Encounter for screening mammogram for malignant neoplasm of breast: Secondary | ICD-10-CM | POA: Insufficient documentation

## 2010-12-12 ENCOUNTER — Ambulatory Visit (INDEPENDENT_AMBULATORY_CARE_PROVIDER_SITE_OTHER): Payer: Medicare Other | Admitting: Urology

## 2010-12-12 DIAGNOSIS — N8111 Cystocele, midline: Secondary | ICD-10-CM

## 2010-12-12 DIAGNOSIS — N952 Postmenopausal atrophic vaginitis: Secondary | ICD-10-CM

## 2011-03-12 LAB — BASIC METABOLIC PANEL
BUN: 17
BUN: 27 — ABNORMAL HIGH
CO2: 29
CO2: 32
Calcium: 8.5
Calcium: 8.6
Chloride: 100
Chloride: 103
Creatinine, Ser: 0.86
Creatinine, Ser: 1.02
GFR calc Af Amer: >60
GFR calc Af Amer: >60
GFR calc non Af Amer: 54 — ABNORMAL LOW
GFR calc non Af Amer: >60
Glucose, Bld: 152 — ABNORMAL HIGH
Glucose, Bld: 86
Potassium: 2.9 — ABNORMAL LOW
Potassium: 3 — ABNORMAL LOW
Sodium: 135
Sodium: 139

## 2011-03-12 LAB — CBC
HCT: 36.2
Hemoglobin: 12.7
MCHC: 35.1
MCV: 98.3
Platelets: 201
RBC: 3.68 — ABNORMAL LOW
RDW: 13.1
WBC: 6.4

## 2011-03-12 LAB — DIFFERENTIAL
Basophils Absolute: 0
Basophils Relative: 1
Eosinophils Absolute: 0.3
Eosinophils Relative: 5
Lymphocytes Relative: 35
Lymphs Abs: 2.2
Monocytes Absolute: 0.5
Monocytes Relative: 9
Neutro Abs: 3.2
Neutrophils Relative %: 51

## 2011-03-12 LAB — POCT CARDIAC MARKERS
CKMB, poc: 1.1
Myoglobin, poc: 94.4
Operator id: 189501
Troponin i, poc: <0.05

## 2011-03-12 LAB — PROTIME-INR
INR: 1.1
Prothrombin Time: 14.1

## 2011-03-12 LAB — APTT: aPTT: 26

## 2011-03-12 LAB — CK TOTAL AND CKMB (NOT AT ARMC)
CK, MB: 1.8
Relative Index: INVALID
Total CK: 72

## 2011-03-12 LAB — CARDIAC PANEL(CRET KIN+CKTOT+MB+TROPI)
CK, MB: 1.5
Relative Index: INVALID
Total CK: 67
Troponin I: 0.02

## 2011-03-12 LAB — TROPONIN I: Troponin I: 0.02

## 2011-12-03 ENCOUNTER — Encounter (HOSPITAL_COMMUNITY): Payer: Self-pay | Admitting: Pharmacy Technician

## 2011-12-05 ENCOUNTER — Encounter (HOSPITAL_COMMUNITY): Payer: Self-pay

## 2011-12-05 ENCOUNTER — Other Ambulatory Visit: Payer: Self-pay

## 2011-12-05 ENCOUNTER — Encounter (HOSPITAL_COMMUNITY)
Admission: RE | Admit: 2011-12-05 | Discharge: 2011-12-05 | Disposition: A | Discharge status: Home / Self Care | Payer: Medicare Other | Source: Ambulatory Visit | Attending: Ophthalmology | Admitting: Ophthalmology

## 2011-12-05 HISTORY — DX: Sleep apnea, unspecified: G47.30

## 2011-12-05 HISTORY — DX: Essential (primary) hypertension: I10

## 2011-12-05 HISTORY — DX: Hyperlipidemia, unspecified: E78.5

## 2011-12-05 LAB — BASIC METABOLIC PANEL
BUN: 17 mg/dL (ref 6–23)
CO2: 33 mEq/L — ABNORMAL HIGH (ref 19–32)
Calcium: 9.6 mg/dL (ref 8.4–10.5)
Chloride: 100 mEq/L (ref 96–112)
Creatinine, Ser: 0.79 mg/dL (ref 0.50–1.10)
GFR calc Af Amer: >90 mL/min (ref >90)
GFR calc non Af Amer: 81 mL/min — ABNORMAL LOW (ref >90)
Glucose, Bld: 92 mg/dL (ref 70–99)
Potassium: 3.2 mEq/L — ABNORMAL LOW (ref 3.5–5.1)
Sodium: 140 mEq/L (ref 135–145)

## 2011-12-05 LAB — HEMOGLOBIN AND HEMATOCRIT, BLOOD
HCT: 38.2 % (ref 36.0–46.0)
Hemoglobin: 13.2 g/dL (ref 12.0–15.0)

## 2011-12-05 NOTE — Patient Instructions (Signed)
[address] Anys[address] MARCE SCHARTZ  12/05/2011   Your procedure is scheduled on:  12/10/11  Report to [facility] at 12:30 AM.  Call this number if you have problems the morning of surgery: [phone]   Remember:   Do not eat or drink:After Midnight.   Take these medicines the morning of surgery with A SIP OF WATER: Amlodipine, Lisinopril, Metoprolol, and Triamterene-HCTZ.   Do not wear jewelry, make-up or nail polish.  Do not wear lotions, powders, or perfumes. You may wear deodorant.  Do not bring valuables to the hospital.  Contacts, dentures or bridgework may not be worn into surgery.  Leave suitcase in the car. After surgery it may be brought to your room.  For patients admitted to the hospital, checkout time is 11:00 AM the day of discharge.   Patients discharged the day of surgery will not be allowed to drive home.  Name and phone number of your driver:   Special Instructions: Take your eye drops as directed by your eye doctor.   Please read over the following fact sheets that you were given: Pain Booklet, Anesthesia Post-op Instructions and Care and Recovery After Surgery    Cataract Surgery  A cataract is a clouding of the lens of the eye. When a lens becomes cloudy, vision is reduced based on the degree and nature of the clouding. Surgery may be needed to improve vision. Surgery removes the cloudy lens and usually replaces it with a substitute lens (intraocular lens, IOL). LET YOUR EYE DOCTOR KNOW ABOUT:  Allergies to food or medicine.   Medicines taken including herbs, eyedrops, over-the-counter medicines, and creams.   Use of steroids (by mouth or creams).   Previous problems with anesthetics or numbing medicine.   History of bleeding problems or blood clots.   Previous surgery.   Other health problems, including diabetes and kidney problems.   Possibility of pregnancy, if this applies.  RISKS AND COMPLICATIONS  Infection.   Inflammation of the eyeball (endophthalmitis)  that can spread to both eyes (sympathetic ophthalmia).   Poor wound healing.   If an IOL is inserted, it can later fall out of proper position. This is very uncommon.   Clouding of the part of your eye that holds an IOL in place. This is called an "after-cataract." These are uncommon, but easily treated.  BEFORE THE PROCEDURE  Do not eat or drink anything except small amounts of water for 8 to 12 before your surgery, or as directed by your caregiver.   Unless you are told otherwise, continue any eyedrops you have been prescribed.   Talk to your primary caregiver about all other medicines that you take (both prescription and non-prescription). In some cases, you may need to stop or change medicines near the time of your surgery. This is most important if you are taking blood-thinning medicine.Do not stop medicines unless you are told to do so.   Arrange for someone to drive you to and from the procedure.   Do not put contact lenses in either eye on the day of your surgery.  PROCEDURE There is more than one method for safely removing a cataract. Your doctor can explain the differences and help determine which is best for you. Phacoemulsification surgery is the most common form of cataract surgery.  An injection is given behind the eye or eyedrops are given to make this a painless procedure.   A small cut (incision) is made on the edge of the clear, dome-shaped surface that covers  the front of the eye (cornea).   A tiny probe is painlessly inserted into the eye. This device gives off ultrasound waves that soften and break up the cloudy center of the lens. This makes it easier for the cloudy lens to be removed by suction.   An IOL may be implanted.   The normal lens of the eye is covered by a clear capsule. Part of that capsule is intentionally left in the eye to support the IOL.   Your surgeon may or may not use stitches to close the incision.  There are other forms of cataract  surgery that require a larger incision and stiches to close the eye. This approach is taken in cases where the doctor feels that the cataract cannot be easily removed using phacoemulsification. AFTER THE PROCEDURE  When an IOL is implanted, it does not need care. It becomes a permanent part of your eye and cannot be seen or felt.   Your doctor will schedule follow-up exams to check on your progress.   Review your other medicines with your doctor to see which can be resumed after surgery.   Use eyedrops or take medicine as prescribed by your doctor.  Document Released: 05/24/2011 Document Reviewed: 05/21/2011 [facility] Patient Information 2012 [place], [place].   PATIENT INSTRUCTIONS POST-ANESTHESIA  IMMEDIATELY FOLLOWING SURGERY:  Do not drive or operate machinery for the first twenty four hours after surgery.  Do not make any important decisions for twenty four hours after surgery or while taking narcotic pain medications or sedatives.  If you develop intractable nausea and vomiting or a severe headache please notify your doctor immediately.  FOLLOW-UP:  Please make an appointment with your surgeon as instructed. You do not need to follow up with anesthesia unless specifically instructed to do so.  WOUND CARE INSTRUCTIONS (if applicable):  Keep a dry clean dressing on the anesthesia/puncture wound site if there is drainage.  Once the wound has quit draining you may leave it open to air.  Generally you should leave the bandage intact for twenty four hours unless there is drainage.  If the epidural site drains for more than 36-48 hours please call the anesthesia department.  QUESTIONS?:  Please feel free to call your physician or the hospital operator if you have any questions, and they will be happy to assist you.

## 2011-12-07 MED ORDER — LIDOCAINE HCL 3.5 % OP GEL
OPHTHALMIC | Status: AC
  Filled 2011-12-07: qty 5

## 2011-12-07 MED ORDER — CYCLOPENTOLATE-PHENYLEPHRINE 0.2-1 % OP SOLN
OPHTHALMIC | Status: AC
  Filled 2011-12-07: qty 2

## 2011-12-07 MED ORDER — LIDOCAINE HCL (PF) 1 % IJ SOLN
INTRAMUSCULAR | Status: AC
  Filled 2011-12-07: qty 2

## 2011-12-07 MED ORDER — NEOMYCIN-POLYMYXIN-DEXAMETH 3.5-10000-0.1 OP OINT
TOPICAL_OINTMENT | OPHTHALMIC | Status: AC
  Filled 2011-12-07: qty 3.5

## 2011-12-07 MED ORDER — TETRACAINE HCL 0.5 % OP SOLN
OPHTHALMIC | Status: AC
  Filled 2011-12-07: qty 2

## 2011-12-10 ENCOUNTER — Ambulatory Visit (HOSPITAL_COMMUNITY): Payer: Medicare Other | Admitting: Anesthesiology

## 2011-12-10 ENCOUNTER — Encounter (HOSPITAL_COMMUNITY): Payer: Self-pay | Admitting: Anesthesiology

## 2011-12-10 ENCOUNTER — Encounter (HOSPITAL_COMMUNITY): Payer: Self-pay | Admitting: *Deleted

## 2011-12-10 ENCOUNTER — Encounter (HOSPITAL_COMMUNITY)
Admission: RE | Disposition: A | Discharge status: Home / Self Care | Payer: Self-pay | Source: Ambulatory Visit | Attending: Ophthalmology

## 2011-12-10 ENCOUNTER — Ambulatory Visit (HOSPITAL_COMMUNITY)
Admission: RE | Admit: 2011-12-10 | Discharge: 2011-12-10 | Disposition: A | Discharge status: Home / Self Care | Payer: Medicare Other | Source: Ambulatory Visit | Attending: Ophthalmology | Admitting: Ophthalmology

## 2011-12-10 DIAGNOSIS — G4733 Obstructive sleep apnea (adult) (pediatric): Secondary | ICD-10-CM | POA: Insufficient documentation

## 2011-12-10 DIAGNOSIS — Z01812 Encounter for preprocedural laboratory examination: Secondary | ICD-10-CM | POA: Insufficient documentation

## 2011-12-10 DIAGNOSIS — Z951 Presence of aortocoronary bypass graft: Secondary | ICD-10-CM | POA: Insufficient documentation

## 2011-12-10 DIAGNOSIS — H2589 Other age-related cataract: Secondary | ICD-10-CM | POA: Insufficient documentation

## 2011-12-10 DIAGNOSIS — Z79899 Other long term (current) drug therapy: Secondary | ICD-10-CM | POA: Insufficient documentation

## 2011-12-10 DIAGNOSIS — I10 Essential (primary) hypertension: Secondary | ICD-10-CM | POA: Insufficient documentation

## 2011-12-10 HISTORY — PX: CATARACT EXTRACTION W/PHACO: SHX586

## 2011-12-10 SURGERY — PHACOEMULSIFICATION, CATARACT, WITH IOL INSERTION
Anesthesia: Monitor Anesthesia Care | Site: Eye | Laterality: Right | Wound class: Clean

## 2011-12-10 MED ORDER — LIDOCAINE 3.5 % OP GEL OPTIME - NO CHARGE
OPHTHALMIC | Status: DC | PRN
  Administered 2011-12-10: 1 [drp] via OPHTHALMIC

## 2011-12-10 MED ORDER — BSS IO SOLN
INTRAOCULAR | Status: DC | PRN
  Administered 2011-12-10: 15 mL via INTRAOCULAR

## 2011-12-10 MED ORDER — TETRACAINE HCL 0.5 % OP SOLN
1.0000 [drp] | OPHTHALMIC | Status: AC
  Administered 2011-12-10 (×3): 1 [drp] via OPHTHALMIC

## 2011-12-10 MED ORDER — LACTATED RINGERS IV SOLN
INTRAVENOUS | Status: DC
  Administered 2011-12-10: 1000 mL via INTRAVENOUS

## 2011-12-10 MED ORDER — PROVISC 10 MG/ML IO SOLN
INTRAOCULAR | Status: DC | PRN
  Administered 2011-12-10: 8.5 mg via INTRAOCULAR

## 2011-12-10 MED ORDER — LIDOCAINE HCL 3.5 % OP GEL: 1.0000 "application " | Freq: Once | OPHTHALMIC | Status: DC

## 2011-12-10 MED ORDER — MIDAZOLAM HCL 2 MG/2ML IJ SOLN
1.0000 mg | INTRAMUSCULAR | Status: DC | PRN
  Administered 2011-12-10: 2 mg via INTRAVENOUS

## 2011-12-10 MED ORDER — EPINEPHRINE HCL 1 MG/ML IJ SOLN
INTRAMUSCULAR | Status: AC
  Filled 2011-12-10: qty 1

## 2011-12-10 MED ORDER — CYCLOPENTOLATE-PHENYLEPHRINE 0.2-1 % OP SOLN
1.0000 [drp] | OPHTHALMIC | Status: AC
  Administered 2011-12-10 (×3): 1 [drp] via OPHTHALMIC

## 2011-12-10 MED ORDER — EPINEPHRINE HCL 1 MG/ML IJ SOLN
INTRAOCULAR | Status: DC | PRN
  Administered 2011-12-10: 14:00:00

## 2011-12-10 MED ORDER — LIDOCAINE HCL (PF) 1 % IJ SOLN
INTRAMUSCULAR | Status: DC | PRN
  Administered 2011-12-10: .3 mL

## 2011-12-10 MED ORDER — POVIDONE-IODINE 5 % OP SOLN
OPHTHALMIC | Status: DC | PRN
  Administered 2011-12-10: 1 via OPHTHALMIC

## 2011-12-10 MED ORDER — PHENYLEPHRINE HCL 2.5 % OP SOLN
1.0000 [drp] | OPHTHALMIC | Status: AC
  Administered 2011-12-10 (×3): 1 [drp] via OPHTHALMIC

## 2011-12-10 MED ORDER — MIDAZOLAM HCL 2 MG/2ML IJ SOLN
INTRAMUSCULAR | Status: AC
  Filled 2011-12-10: qty 2

## 2011-12-10 MED ORDER — NEOMYCIN-POLYMYXIN-DEXAMETH 0.1 % OP OINT
TOPICAL_OINTMENT | OPHTHALMIC | Status: DC | PRN
  Administered 2011-12-10: 1 via OPHTHALMIC

## 2011-12-10 MED ORDER — TETRACAINE HCL 0.5 % OP SOLN
OPHTHALMIC | Status: AC
  Administered 2011-12-10: 1 [drp] via OPHTHALMIC
  Filled 2011-12-10: qty 2

## 2011-12-10 SURGICAL SUPPLY — 32 items

## 2011-12-10 NOTE — Brief Op Note (Signed)
Pre-Op Dx: Cataract OD Post-Op Dx: Cataract OD Surgeon: Modene Andy Anesthesia: Topical with MAC Surgery: Cataract Extraction with Intraocular lens Implant OD Implant: Lenstec, Model Softec HD Blood Loss: None Specimen: None Complications: None 

## 2011-12-10 NOTE — Anesthesia Postprocedure Evaluation (Signed)
  Anesthesia Post-op Note  Patient: Monica Stafford  Procedure(s) Performed: Procedure(s) (LRB): CATARACT EXTRACTION PHACO AND INTRAOCULAR LENS PLACEMENT (IOC) (Right)  Patient Location:  Short Stay  Anesthesia Type: MAC  Level of Consciousness: awake  Airway and Oxygen Therapy: Patient Spontanous Breathing  Post-op Pain: none  Post-op Assessment: Post-op Vital signs reviewed, Patient's Cardiovascular Status Stable, Respiratory Function Stable, Patent Airway, No signs of Nausea or vomiting and Pain level controlled  Post-op Vital Signs: Reviewed and stable  Complications: No apparent anesthesia complications

## 2011-12-10 NOTE — Op Note (Signed)
Monica Stafford, Monica Stafford NO.:  [id number]  MEDICAL RECORD NO.:  [id number]  LOCATION:  [facility]                          FACILITY:  [facility]  PHYSICIAN:  Susanne Greenhouse, MD       DATE OF BIRTH:  07/01/1940  DATE OF PROCEDURE:  12/10/2011 DATE OF DISCHARGE:  12/10/2011                              OPERATIVE REPORT   PREOPERATIVE DIAGNOSIS:  Combined cataract, right eye, diagnosis code 366.19.  POSTOPERATIVE DIAGNOSIS:  Combined cataract, right eye, diagnosis code 366.19.  OPERATION PERFORMED:  Phacoemulsification with posterior chamber intraocular lens implantation, right eye.  SURGEON:  Bonne Dolores. Eh Sesay, MD  ANESTHESIA:  Topical with IV sedation.  OPERATIVE SUMMARY:  In the preoperative area, dilating drops were placed into the right eye.  The patient was then brought into the operating room where she was placed under general anesthesia.  The eye was then prepped and draped.  Beginning with a 75 blade, a paracentesis port was made at the surgeon's 2 o'clock position.  The anterior chamber was then filled with a 1% nonpreserved lidocaine solution with epinephrine.  This was followed by Viscoat to deepen the chamber.  A small fornix-based peritomy was performed superiorly.  Next, a single iris hook was placed through the limbus superiorly.  A 2.4-mm keratome blade was then used to make a clear corneal incision over the iris hook.  A bent cystotome needle and Utrata forceps were used to create a continuous tear capsulotomy.  Hydrodissection was performed using balanced salt solution on a fine cannula.  The lens nucleus was then removed using phacoemulsification in a quadrant cracking technique.  The cortical material was then removed with irrigation and aspiration.  The capsular bag and anterior chamber were refilled with Provisc.  The wound was widened to approximately 3 mm and a posterior chamber intraocular lens was placed into the capsular bag without difficulty using  an [employer] lens injecting system.  A single 10-0 nylon suture was then used to close the incision as well as stromal hydration.  The Provisc was removed from the anterior chamber and capsular bag with irrigation and aspiration.  At this point, the wounds were tested for leak, which were negative.  The anterior chamber remained deep and stable.  The patient tolerated the procedure well.  There were no operative complications, and she awoke from general anesthesia without problem.  Torque intraocular lens was oriented at the 179 and 359 degrees meridians.  No surgical specimens. Prosthetic device used is a Lenstec posterior chamber lens, model Softec HD, power of 19.75, serial number is [id number].          ______________________________ Susanne Greenhouse, MD     KEH/MEDQ  D:  12/10/2011  T:  12/10/2011  Job:  [id number]

## 2011-12-10 NOTE — Anesthesia Preprocedure Evaluation (Signed)
Anesthesia Evaluation  Patient identified by MRN, date of birth, ID band Patient awake    Reviewed: Allergy & Precautions, H&P , NPO status , Patient's Chart, lab work & pertinent test results  Airway Mallampati: I      Dental  (+) Teeth Intact and Partial Lower   Pulmonary sleep apnea ,  breath sounds clear to auscultation        Cardiovascular hypertension, Pt. on medications + CAD and + CABG Rhythm:Regular     Neuro/Psych    GI/Hepatic negative GI ROS,   Endo/Other    Renal/GU      Musculoskeletal   Abdominal   Peds  Hematology   Anesthesia Other Findings   Reproductive/Obstetrics                           Anesthesia Physical Anesthesia Plan  ASA: III  Anesthesia Plan: MAC   Post-op Pain Management:    Induction: Intravenous  Airway Management Planned: Nasal Cannula  Additional Equipment:   Intra-op Plan:   Post-operative Plan:   Informed Consent: I have reviewed the patients History and Physical, chart, labs and discussed the procedure including the risks, benefits and alternatives for the proposed anesthesia with the patient or authorized representative who has indicated his/her understanding and acceptance.     Plan Discussed with:   Anesthesia Plan Comments:         Anesthesia Quick Evaluation

## 2011-12-10 NOTE — Anesthesia Procedure Notes (Signed)
Procedure Name: MAC Date/Time: 12/10/2011 1:30 PM Performed by: Franco Nones Pre-anesthesia Checklist: Patient identified, Emergency Drugs available, Suction available, Timeout performed and Patient being monitored Patient Re-evaluated:Patient Re-evaluated prior to inductionOxygen Delivery Method: Nasal Cannula

## 2011-12-10 NOTE — Discharge Instructions (Signed)
Monica Stafford  12/10/2011     Instructions  1. Use medications as Instructed.  Shake well before use. Wait 5 minutes between drops.         2. Do not rub the operative eye. Do not swim underwater for 2 weeks.  3. You may remove the clear shield and resume your normal activities the day after  Surgery. Your eyes may feel more comfortable if you wear dark glasses outside.  4. Call our office at [phone] if you have sudden change in vision, extreme redness or pain. Some fluctuation in vision is normal after surgery. If you have an emergency after hours, call Dr. Alto Denver at [phone].  5. It is important that you attend all of your follow-up appointments.        Follow-up:{follow up:32580} with Gemma Payor, MD.   Dr. Lahoma Crocker: [phone]  Dr. Lita Mains: [phone]  Dr. Alto Denver: [phone]   If you find that you cannot contact your physician, but feel that your signs and   Symptoms warrant a physician's attention, call the Emergency Room at   [phone] [phone].   Other{NA AND [id number]:44034}.

## 2011-12-10 NOTE — H&P (Signed)
I have reviewed the H&P, the patient was re-examined, and I have identified no interval changes in medical condition and plan of care since the history and physical of record  

## 2011-12-10 NOTE — Transfer of Care (Signed)
Immediate Anesthesia Transfer of Care Note  Patient: Monica Stafford  Procedure(s) Performed: Procedure(s) (LRB): CATARACT EXTRACTION PHACO AND INTRAOCULAR LENS PLACEMENT (IOC) (Right)  Patient Location: Shortstay  Anesthesia Type: MAC  Level of Consciousness: awake  Airway & Oxygen Therapy: Patient Spontanous Breathing   Post-op Assessment: Report given to PACU RN, Post -op Vital signs reviewed and stable and Patient moving all extremities  Post vital signs: Reviewed and stable  Complications: No apparent anesthesia complications

## 2011-12-11 ENCOUNTER — Encounter (HOSPITAL_COMMUNITY): Payer: Self-pay | Admitting: Ophthalmology

## 2011-12-11 ENCOUNTER — Other Ambulatory Visit (HOSPITAL_COMMUNITY): Payer: Self-pay | Admitting: Family Medicine

## 2011-12-11 DIAGNOSIS — Z139 Encounter for screening, unspecified: Secondary | ICD-10-CM

## 2011-12-17 ENCOUNTER — Ambulatory Visit (HOSPITAL_COMMUNITY)
Admission: RE | Admit: 2011-12-17 | Discharge: 2011-12-17 | Disposition: A | Discharge status: Home / Self Care | Payer: Medicare Other | Source: Ambulatory Visit | Attending: Family Medicine | Admitting: Family Medicine

## 2011-12-17 DIAGNOSIS — Z1231 Encounter for screening mammogram for malignant neoplasm of breast: Secondary | ICD-10-CM | POA: Insufficient documentation

## 2011-12-17 DIAGNOSIS — Z139 Encounter for screening, unspecified: Secondary | ICD-10-CM

## 2011-12-31 ENCOUNTER — Encounter (HOSPITAL_COMMUNITY): Payer: Self-pay | Admitting: Pharmacy Technician

## 2012-01-02 ENCOUNTER — Encounter (HOSPITAL_COMMUNITY)
Admission: RE | Admit: 2012-01-02 | Discharge: 2012-01-02 | Payer: Medicare Other | Source: Ambulatory Visit | Admitting: Ophthalmology

## 2012-01-02 MED ORDER — FENTANYL CITRATE 0.05 MG/ML IJ SOLN: 25.0000 ug | INTRAMUSCULAR | Status: DC | PRN

## 2012-01-02 MED ORDER — ONDANSETRON HCL 4 MG/2ML IJ SOLN: 4.0000 mg | Freq: Once | INTRAMUSCULAR | Status: DC | PRN

## 2012-01-04 MED ORDER — LIDOCAINE HCL 3.5 % OP GEL
OPHTHALMIC | Status: AC
  Filled 2012-01-04: qty 5

## 2012-01-04 MED ORDER — PHENYLEPHRINE HCL 2.5 % OP SOLN
OPHTHALMIC | Status: AC
  Filled 2012-01-04: qty 2

## 2012-01-04 MED ORDER — LIDOCAINE HCL (PF) 1 % IJ SOLN
INTRAMUSCULAR | Status: AC
  Filled 2012-01-04: qty 2

## 2012-01-04 MED ORDER — CYCLOPENTOLATE-PHENYLEPHRINE 0.2-1 % OP SOLN
OPHTHALMIC | Status: AC
  Filled 2012-01-04: qty 2

## 2012-01-04 MED ORDER — NEOMYCIN-POLYMYXIN-DEXAMETH 3.5-10000-0.1 OP OINT
TOPICAL_OINTMENT | OPHTHALMIC | Status: AC
  Filled 2012-01-04: qty 3.5

## 2012-01-04 MED ORDER — TETRACAINE HCL 0.5 % OP SOLN
OPHTHALMIC | Status: AC
  Filled 2012-01-04: qty 2

## 2012-01-07 ENCOUNTER — Encounter (HOSPITAL_COMMUNITY): Payer: Self-pay | Admitting: *Deleted

## 2012-01-07 ENCOUNTER — Ambulatory Visit (HOSPITAL_COMMUNITY): Payer: Medicare Other | Admitting: Anesthesiology

## 2012-01-07 ENCOUNTER — Encounter (HOSPITAL_COMMUNITY)
Admission: RE | Disposition: A | Discharge status: Home / Self Care | Payer: Self-pay | Source: Ambulatory Visit | Attending: Ophthalmology

## 2012-01-07 ENCOUNTER — Encounter (HOSPITAL_COMMUNITY): Payer: Self-pay | Admitting: Anesthesiology

## 2012-01-07 ENCOUNTER — Ambulatory Visit (HOSPITAL_COMMUNITY)
Admission: RE | Admit: 2012-01-07 | Discharge: 2012-01-07 | Disposition: A | Discharge status: Home / Self Care | Payer: Medicare Other | Source: Ambulatory Visit | Attending: Ophthalmology | Admitting: Ophthalmology

## 2012-01-07 DIAGNOSIS — G4733 Obstructive sleep apnea (adult) (pediatric): Secondary | ICD-10-CM | POA: Insufficient documentation

## 2012-01-07 DIAGNOSIS — Z951 Presence of aortocoronary bypass graft: Secondary | ICD-10-CM | POA: Insufficient documentation

## 2012-01-07 DIAGNOSIS — H2510 Age-related nuclear cataract, unspecified eye: Secondary | ICD-10-CM | POA: Insufficient documentation

## 2012-01-07 DIAGNOSIS — I10 Essential (primary) hypertension: Secondary | ICD-10-CM | POA: Insufficient documentation

## 2012-01-07 DIAGNOSIS — Z79899 Other long term (current) drug therapy: Secondary | ICD-10-CM | POA: Insufficient documentation

## 2012-01-07 HISTORY — PX: CATARACT EXTRACTION W/PHACO: SHX586

## 2012-01-07 SURGERY — PHACOEMULSIFICATION, CATARACT, WITH IOL INSERTION
Anesthesia: Monitor Anesthesia Care | Site: Eye | Laterality: Left | Wound class: Clean

## 2012-01-07 MED ORDER — CYCLOPENTOLATE-PHENYLEPHRINE 0.2-1 % OP SOLN
1.0000 [drp] | OPHTHALMIC | Status: AC
  Administered 2012-01-07 (×3): 1 [drp] via OPHTHALMIC

## 2012-01-07 MED ORDER — PROVISC 10 MG/ML IO SOLN
INTRAOCULAR | Status: DC | PRN
  Administered 2012-01-07: 8.5 mg via INTRAOCULAR

## 2012-01-07 MED ORDER — LIDOCAINE HCL 3.5 % OP GEL
1.0000 "application " | Freq: Once | OPHTHALMIC | Status: AC
  Administered 2012-01-07: 1 via OPHTHALMIC

## 2012-01-07 MED ORDER — BSS IO SOLN
INTRAOCULAR | Status: DC | PRN
  Administered 2012-01-07: 15 mL via INTRAOCULAR

## 2012-01-07 MED ORDER — MIDAZOLAM HCL 2 MG/2ML IJ SOLN
INTRAMUSCULAR | Status: AC
  Filled 2012-01-07: qty 2

## 2012-01-07 MED ORDER — LIDOCAINE 3.5 % OP GEL OPTIME - NO CHARGE
OPHTHALMIC | Status: DC | PRN
  Administered 2012-01-07: 2 [drp] via OPHTHALMIC

## 2012-01-07 MED ORDER — LACTATED RINGERS IV SOLN
INTRAVENOUS | Status: DC
  Administered 2012-01-07: 1000 mL via INTRAVENOUS

## 2012-01-07 MED ORDER — LIDOCAINE HCL (PF) 1 % IJ SOLN
INTRAMUSCULAR | Status: DC | PRN
  Administered 2012-01-07: .4 mL

## 2012-01-07 MED ORDER — PHENYLEPHRINE HCL 2.5 % OP SOLN
1.0000 [drp] | OPHTHALMIC | Status: AC
  Administered 2012-01-07 (×3): 1 [drp] via OPHTHALMIC

## 2012-01-07 MED ORDER — TETRACAINE HCL 0.5 % OP SOLN
1.0000 [drp] | OPHTHALMIC | Status: AC
  Administered 2012-01-07 (×3): 1 [drp] via OPHTHALMIC

## 2012-01-07 MED ORDER — ONDANSETRON HCL 4 MG/2ML IJ SOLN: 4.0000 mg | Freq: Once | INTRAMUSCULAR | Status: DC | PRN

## 2012-01-07 MED ORDER — MIDAZOLAM HCL 2 MG/2ML IJ SOLN
1.0000 mg | INTRAMUSCULAR | Status: DC | PRN
  Administered 2012-01-07: 2 mg via INTRAVENOUS

## 2012-01-07 MED ORDER — NEOMYCIN-POLYMYXIN-DEXAMETH 0.1 % OP OINT
TOPICAL_OINTMENT | OPHTHALMIC | Status: DC | PRN
  Administered 2012-01-07: 1 via OPHTHALMIC

## 2012-01-07 MED ORDER — EPINEPHRINE HCL 1 MG/ML IJ SOLN
INTRAOCULAR | Status: DC | PRN
  Administered 2012-01-07: 08:00:00

## 2012-01-07 MED ORDER — FENTANYL CITRATE 0.05 MG/ML IJ SOLN: 25.0000 ug | INTRAMUSCULAR | Status: DC | PRN

## 2012-01-07 MED ORDER — POVIDONE-IODINE 5 % OP SOLN
OPHTHALMIC | Status: DC | PRN
  Administered 2012-01-07: 1 via OPHTHALMIC

## 2012-01-07 SURGICAL SUPPLY — 32 items

## 2012-01-07 NOTE — Transfer of Care (Signed)
Immediate Anesthesia Transfer of Care Note  Patient: Monica Stafford  Procedure(s) Performed: Procedure(s) (LRB): CATARACT EXTRACTION PHACO AND INTRAOCULAR LENS PLACEMENT (IOC) (Left)  Patient Location: Shortstay  Anesthesia Type: MAC  Level of Consciousness: awake  Airway & Oxygen Therapy: Patient Spontanous Breathing   Post-op Assessment: Report given to PACU RN, Post -op Vital signs reviewed and stable and Patient moving all extremities  Post vital signs: Reviewed and stable  Complications: No apparent anesthesia complications

## 2012-01-07 NOTE — Op Note (Signed)
NAMEKELIE, GAINEY             ACCOUNT NO.:  [id number]  MEDICAL RECORD NO.:  [id number]  LOCATION:  [facility]                          FACILITY:  [facility]  PHYSICIAN:  Susanne Greenhouse, MD       DATE OF BIRTH:  10-04-1940  DATE OF PROCEDURE:  01/07/2012 DATE OF DISCHARGE:  01/07/2012                              OPERATIVE REPORT   TEMPLATE NOT AVAILABLE  PREOPERATIVE DIAGNOSIS:  Combined cataract, left eye, diagnosis code 366.19.  POSTOPERATIVE DIAGNOSIS:  Combined cataract, left eye, diagnosis code 366.19.  OPERATION PERFORMED:  SURGEON:  Nash Dimmer E. Musa Rewerts, MD.  OPERATIVE SUMMARY: In the preoperative area, dilating drops were placed  into the left eye. The patient was then brought into the operating room  where she was placed under general anesthesia. The eye was then prepped  and draped. Beginning with a 75 blade, a paracentesis port was made at  the surgeon's 2 o'clock position. The anterior chamber was then filled  with a 1% nonpreserved lidocaine solution with epinephrine. This was  followed by Viscoat to deepen the chamber. A small fornix-based  peritomy was performed superiorly. Next, a single iris hook was placed  through the limbus superiorly. A 2.4-mm keratome blade was then used to  make a clear corneal incision over the iris hook. A bent cystotome  needle and Utrata forceps were used to create a continuous tear  capsulotomy. Hydrodissection was performed using balanced salt solution  on a fine cannula. The lens nucleus was then removed using  phacoemulsification in a quadrant cracking technique. The cortical  material was then removed with irrigation and aspiration. The capsular  bag and anterior chamber were refilled with Provisc. The wound was  widened to approximately 3 mm and a posterior chamber intraocular lens  was placed into the capsular bag without difficulty using an [employer] lens injecting system. A single 10-0 nylon suture was then used  to close the  incision as well as stromal hydration. The Provisc was  removed from the anterior chamber and capsular bag with irrigation and  aspiration. At this point, the wounds were tested for leak, which were  negative. The anterior chamber remained deep and stable. The patient  tolerated the procedure well. There were no operative complications,  and she awoke from general anesthesia without problem.   SURGICAL SPECIMENS:  None.  PROSTHETIC DEVICE USED:  A Lenstec posterior chamber lens, model Softec HD, power of 20.25, serial number [id number].          ______________________________ Susanne Greenhouse, MD     KEH/MEDQ  D:  01/07/2012  T:  01/07/2012  Job:  [id number]

## 2012-01-07 NOTE — Anesthesia Postprocedure Evaluation (Signed)
  Anesthesia Post-op Note  Patient: Monica Stafford  Procedure(s) Performed: Procedure(s) (LRB): CATARACT EXTRACTION PHACO AND INTRAOCULAR LENS PLACEMENT (IOC) (Left)  Patient Location:  Short Stay  Anesthesia Type: MAC  Level of Consciousness: awake  Airway and Oxygen Therapy: Patient Spontanous Breathing  Post-op Pain: none  Post-op Assessment: Post-op Vital signs reviewed, Patient's Cardiovascular Status Stable, Respiratory Function Stable, Patent Airway, No signs of Nausea or vomiting and Pain level controlled  Post-op Vital Signs: Reviewed and stable  Complications: No apparent anesthesia complications

## 2012-01-07 NOTE — Anesthesia Preprocedure Evaluation (Signed)
Anesthesia Evaluation  Patient identified by MRN, date of birth, ID band Patient awake    Reviewed: Allergy & Precautions, H&P , NPO status , Patient's Chart, lab work & pertinent test results  Airway Mallampati: I      Dental  (+) Teeth Intact and Partial Lower   Pulmonary sleep apnea ,  breath sounds clear to auscultation        Cardiovascular hypertension, Pt. on medications + CAD and + CABG Rhythm:Regular     Neuro/Psych    GI/Hepatic negative GI ROS,   Endo/Other    Renal/GU      Musculoskeletal   Abdominal   Peds  Hematology   Anesthesia Other Findings   Reproductive/Obstetrics                           Anesthesia Physical Anesthesia Plan  ASA: III  Anesthesia Plan: MAC   Post-op Pain Management:    Induction: Intravenous  Airway Management Planned: Nasal Cannula  Additional Equipment:   Intra-op Plan:   Post-operative Plan:   Informed Consent: I have reviewed the patients History and Physical, chart, labs and discussed the procedure including the risks, benefits and alternatives for the proposed anesthesia with the patient or authorized representative who has indicated his/her understanding and acceptance.     Plan Discussed with:   Anesthesia Plan Comments:         Anesthesia Quick Evaluation  

## 2012-01-07 NOTE — Brief Op Note (Signed)
Pre-Op Dx: Cataract OS Post-Op Dx: Cataract OS Surgeon: Marilyne Haseley Anesthesia: Topical with MAC Surgery: Cataract Extraction with Intraocular lens Implant OS Implant: Lenstec, Model Softec HD Specimen: None Complications: None 

## 2012-01-07 NOTE — H&P (Signed)
I have reviewed the H&P, the patient was re-examined, and I have identified no interval changes in medical condition and plan of care since the history and physical of record  

## 2012-01-07 NOTE — Anesthesia Procedure Notes (Signed)
Procedure Name: MAC Date/Time: 01/07/2012 8:19 AM Performed by: Franco Nones Pre-anesthesia Checklist: Patient identified, Emergency Drugs available, Suction available, Timeout performed and Patient being monitored Patient Re-evaluated:Patient Re-evaluated prior to inductionOxygen Delivery Method: Nasal Cannula

## 2012-01-09 ENCOUNTER — Encounter (HOSPITAL_COMMUNITY): Payer: Self-pay | Admitting: Ophthalmology

## 2012-06-24 ENCOUNTER — Ambulatory Visit (INDEPENDENT_AMBULATORY_CARE_PROVIDER_SITE_OTHER): Payer: Medicare Other | Admitting: Urology

## 2012-06-24 DIAGNOSIS — R82998 Other abnormal findings in urine: Secondary | ICD-10-CM

## 2012-06-24 DIAGNOSIS — N952 Postmenopausal atrophic vaginitis: Secondary | ICD-10-CM

## 2013-03-13 ENCOUNTER — Other Ambulatory Visit (HOSPITAL_COMMUNITY): Payer: Self-pay | Admitting: Family Medicine

## 2013-03-13 DIAGNOSIS — Z139 Encounter for screening, unspecified: Secondary | ICD-10-CM

## 2013-03-19 ENCOUNTER — Ambulatory Visit (HOSPITAL_COMMUNITY)
Admission: RE | Admit: 2013-03-19 | Discharge: 2013-03-19 | Disposition: A | Discharge status: Home / Self Care | Payer: Medicare Other | Source: Ambulatory Visit | Attending: Family Medicine | Admitting: Family Medicine

## 2013-03-19 DIAGNOSIS — Z1231 Encounter for screening mammogram for malignant neoplasm of breast: Secondary | ICD-10-CM | POA: Insufficient documentation

## 2013-03-19 DIAGNOSIS — Z139 Encounter for screening, unspecified: Secondary | ICD-10-CM

## 2013-07-14 ENCOUNTER — Ambulatory Visit (INDEPENDENT_AMBULATORY_CARE_PROVIDER_SITE_OTHER): Payer: Medicare Other | Admitting: Urology

## 2013-07-14 DIAGNOSIS — N952 Postmenopausal atrophic vaginitis: Secondary | ICD-10-CM

## 2014-04-02 ENCOUNTER — Other Ambulatory Visit (HOSPITAL_COMMUNITY): Payer: Self-pay | Admitting: Family Medicine

## 2014-04-02 DIAGNOSIS — Z1231 Encounter for screening mammogram for malignant neoplasm of breast: Secondary | ICD-10-CM

## 2014-04-08 ENCOUNTER — Ambulatory Visit (HOSPITAL_COMMUNITY)
Admission: RE | Admit: 2014-04-08 | Discharge: 2014-04-08 | Disposition: A | Discharge status: Home / Self Care | Payer: Medicare Other | Source: Ambulatory Visit | Attending: Family Medicine | Admitting: Family Medicine

## 2014-04-08 DIAGNOSIS — Z1231 Encounter for screening mammogram for malignant neoplasm of breast: Secondary | ICD-10-CM | POA: Diagnosis present

## 2014-04-15 ENCOUNTER — Other Ambulatory Visit: Payer: Self-pay | Admitting: Family Medicine

## 2014-04-15 DIAGNOSIS — R928 Other abnormal and inconclusive findings on diagnostic imaging of breast: Secondary | ICD-10-CM

## 2014-04-20 ENCOUNTER — Ambulatory Visit (HOSPITAL_COMMUNITY)
Admission: RE | Admit: 2014-04-20 | Discharge: 2014-04-20 | Disposition: A | Discharge status: Home / Self Care | Payer: Medicare Other | Source: Ambulatory Visit | Attending: Family Medicine | Admitting: Family Medicine

## 2014-04-20 ENCOUNTER — Other Ambulatory Visit: Payer: Self-pay | Admitting: Family Medicine

## 2014-04-20 DIAGNOSIS — R928 Other abnormal and inconclusive findings on diagnostic imaging of breast: Secondary | ICD-10-CM | POA: Insufficient documentation

## 2014-08-16 ENCOUNTER — Other Ambulatory Visit (HOSPITAL_COMMUNITY): Payer: Self-pay | Admitting: Family Medicine

## 2014-08-16 DIAGNOSIS — Z780 Asymptomatic menopausal state: Secondary | ICD-10-CM

## 2014-08-23 ENCOUNTER — Ambulatory Visit (HOSPITAL_COMMUNITY)
Admission: RE | Admit: 2014-08-23 | Discharge: 2014-08-23 | Disposition: A | Discharge status: Home / Self Care | Payer: Medicare Other | Source: Ambulatory Visit | Attending: Family Medicine | Admitting: Family Medicine

## 2014-08-23 DIAGNOSIS — Z780 Asymptomatic menopausal state: Secondary | ICD-10-CM | POA: Diagnosis not present

## 2014-09-27 ENCOUNTER — Other Ambulatory Visit (HOSPITAL_COMMUNITY): Payer: Self-pay | Admitting: Family Medicine

## 2014-09-27 DIAGNOSIS — Z09 Encounter for follow-up examination after completed treatment for conditions other than malignant neoplasm: Secondary | ICD-10-CM

## 2014-11-02 ENCOUNTER — Ambulatory Visit (HOSPITAL_COMMUNITY)
Admission: RE | Admit: 2014-11-02 | Discharge: 2014-11-02 | Disposition: A | Discharge status: Home / Self Care | Payer: Medicare Other | Source: Ambulatory Visit | Attending: Family Medicine | Admitting: Family Medicine

## 2014-11-02 DIAGNOSIS — Z09 Encounter for follow-up examination after completed treatment for conditions other than malignant neoplasm: Secondary | ICD-10-CM

## 2014-11-02 DIAGNOSIS — N63 Unspecified lump in breast: Secondary | ICD-10-CM | POA: Diagnosis present

## 2015-02-15 ENCOUNTER — Other Ambulatory Visit (HOSPITAL_COMMUNITY): Payer: Self-pay | Admitting: Family Medicine

## 2015-02-15 DIAGNOSIS — I6522 Occlusion and stenosis of left carotid artery: Secondary | ICD-10-CM

## 2015-02-22 ENCOUNTER — Ambulatory Visit (HOSPITAL_COMMUNITY)
Admission: RE | Admit: 2015-02-22 | Discharge: 2015-02-22 | Disposition: A | Discharge status: Home / Self Care | Payer: Medicare Other | Source: Ambulatory Visit | Attending: Family Medicine | Admitting: Family Medicine

## 2015-02-22 DIAGNOSIS — I6521 Occlusion and stenosis of right carotid artery: Secondary | ICD-10-CM | POA: Insufficient documentation

## 2015-02-22 DIAGNOSIS — I6522 Occlusion and stenosis of left carotid artery: Secondary | ICD-10-CM | POA: Insufficient documentation

## 2015-03-22 ENCOUNTER — Ambulatory Visit (INDEPENDENT_AMBULATORY_CARE_PROVIDER_SITE_OTHER): Payer: Medicare Other | Admitting: Cardiology

## 2015-03-22 ENCOUNTER — Encounter: Payer: Self-pay | Admitting: Cardiology

## 2015-03-22 VITALS — BP 124/68 | HR 54 | Ht 68.5 in | Wt 170.2 lb

## 2015-03-22 DIAGNOSIS — Z23 Encounter for immunization: Secondary | ICD-10-CM | POA: Diagnosis not present

## 2015-03-22 DIAGNOSIS — I6529 Occlusion and stenosis of unspecified carotid artery: Secondary | ICD-10-CM | POA: Diagnosis not present

## 2015-03-22 DIAGNOSIS — I10 Essential (primary) hypertension: Secondary | ICD-10-CM

## 2015-03-22 DIAGNOSIS — I2510 Atherosclerotic heart disease of native coronary artery without angina pectoris: Secondary | ICD-10-CM | POA: Diagnosis not present

## 2015-03-22 DIAGNOSIS — Z136 Encounter for screening for cardiovascular disorders: Secondary | ICD-10-CM | POA: Diagnosis not present

## 2015-03-22 DIAGNOSIS — E785 Hyperlipidemia, unspecified: Secondary | ICD-10-CM

## 2015-03-22 MED ORDER — METOPROLOL TARTRATE 25 MG PO TABS: 25.0000 mg | ORAL_TABLET | Freq: Two times a day (BID) | ORAL | Status: DC

## 2015-03-22 MED ORDER — ATORVASTATIN CALCIUM 80 MG PO TABS: 80.0000 mg | ORAL_TABLET | Freq: Every day | ORAL | Status: DC

## 2015-03-22 NOTE — Patient Instructions (Addendum)
Your physician wants you to follow-up in: 6 MONTHS WITH DR. BRANCH. You will receive a reminder letter in the mail two months in advance. If you don't receive a letter, please call our office to schedule the follow-up appointment.   Your physician has recommended you make the following change in your medication:  STOP TAKING SIMVASTATIN  START TAKING ATORVASTATIN 80 MG DAILY   Your physician has requested that you regularly monitor and record your blood pressure readings at home. Please use the same machine at the same time of day to check your readings and record them to bring to your follow-up visit.    Addendum:Lopressor decreased to 25 mg twice a day,pt aware

## 2015-03-22 NOTE — Progress Notes (Signed)
Patient ID: Monica Stafford, female   DOB: 05/13/1941, 74 y.o.   MRN: [id number]     Clinical Summary Monica Stafford is a 74 y.o.female seen today as a new patient for the following medical problems.   1. CAD - history of CABG in 2002 (LIMA-LAD,RIMA-RCA,SVG-OM1,SVG-D2). LVEF 69% by LVgram in 2002 - denies any chest pain, no SOB or DOE. Stays active, mainly doing moderate yardwork - no LE edema, no orthopnea, no PND.Marland Kitchen - compliant with meds   2. Carotid stenosis - history of left CEA 12/2000 - 02/2015 right 50-70% at right carotid bulb bifurcation, no significant disease on left.  - has f/u with vascular later this month  3. Hyperlipidemia - 07/2014 TC 129 HDL 49 TGs 108 LDL 58 - compliant with statin  4.HTN - does not check regularly at home - compliant with meds  5. Sinus bradycardia - denies any lightheadness or dizziness.  Past Medical History  Diagnosis Date  . Hypertension   . Hyperlipidemia   . Sleep apnea     mild     Allergies  Allergen Reactions  . Sulfonamide Derivatives Itching     Current Outpatient Prescriptions  Medication Sig Dispense Refill  . alendronate (FOSAMAX) 70 MG tablet Take 70 mg by mouth every 7 (seven) days. Take with a full glass of water on an empty stomach. Usually on Monday    . amLODipine (NORVASC) 5 MG tablet Take 5 mg by mouth daily.    Marland Kitchen aspirin EC 81 MG tablet Take 81 mg by mouth daily.    . Calcium Carbonate-Vitamin D (CALTRATE 600+D) 600-400 MG-UNIT per tablet Take 1 tablet by mouth 3 (three) times daily.     . cholecalciferol (VITAMIN D) 1000 UNITS tablet Take 1,000 Units by mouth daily.    Marland Kitchen estradiol (ESTRACE) 0.1 MG/GM vaginal cream Place 1 g vaginally every other day.     . fish oil-omega-3 fatty acids 1000 MG capsule Take 1 g by mouth daily.     . furosemide (LASIX) 40 MG tablet Take 20 mg by mouth daily.    Marland Kitchen ibuprofen (ADVIL,MOTRIN) 200 MG tablet Take 400 mg by mouth every 6 (six) hours as needed. For pain    . lisinopril  (PRINIVIL,ZESTRIL) 40 MG tablet Take 40 mg by mouth daily.    . metoprolol (LOPRESSOR) 50 MG tablet Take 50 mg by mouth 2 (two) times daily.    . Misc Natural Products (OSTEO BI-FLEX ADV DOUBLE ST PO) Take 2 tablets by mouth daily.     . Multiple Vitamin (MULTIVITAMIN WITH MINERALS) TABS Take 1 tablet by mouth daily.    . potassium chloride (MICRO-K) 10 MEQ CR capsule Take 10 mEq by mouth 2 (two) times daily.    . raloxifene (EVISTA) 60 MG tablet Take 60 mg by mouth daily.    . simvastatin (ZOCOR) 40 MG tablet Take 40 mg by mouth every evening.    . triamterene-hydrochlorothiazide (MAXZIDE) 75-50 MG per tablet Take 0.5 tablets by mouth every other day.     No current facility-administered medications for this visit.     Past Surgical History  Procedure Laterality Date  . Cardiac catheterization    . Abdominal hysterectomy  1998    [facility]  . Coronary artery bypass graft  12/23/2000    [facility]  . Cataract extraction w/phaco  12/10/2011    Procedure: CATARACT EXTRACTION PHACO AND INTRAOCULAR LENS PLACEMENT (IOC);  Surgeon: Gemma Payor, MD;  Location: [facility];  Service: Ophthalmology;  Laterality: Right;  CDE:10.40  . Cataract extraction w/phaco  01/07/2012    Procedure: CATARACT EXTRACTION PHACO AND INTRAOCULAR LENS PLACEMENT (IOC);  Surgeon: Gemma Payor, MD;  Location: [facility];  Service: Ophthalmology;  Laterality: Left;  CDE 8.38     Allergies  Allergen Reactions  . Sulfonamide Derivatives Itching      No family history on file.   Social History Monica Stafford reports that she has never smoked. She does not have any smokeless tobacco history on file. Monica Stafford reports that she does not drink alcohol.   Review of Systems CONSTITUTIONAL: No weight loss, fever, chills, weakness or fatigue.  HEENT: Eyes: No visual loss, blurred vision, double vision or yellow sclerae.No hearing loss, sneezing, congestion, runny nose or sore throat.  SKIN: No rash or itching.   CARDIOVASCULAR: per HPI RESPIRATORY: No shortness of breath, cough or sputum.  GASTROINTESTINAL: No anorexia, nausea, vomiting or diarrhea. No abdominal pain or blood.  GENITOURINARY: No burning on urination, no polyuria NEUROLOGICAL: No headache, dizziness, syncope, paralysis, ataxia, numbness or tingling in the extremities. No change in bowel or bladder control.  MUSCULOSKELETAL: No muscle, back pain, joint pain or stiffness.  LYMPHATICS: No enlarged nodes. No history of splenectomy.  PSYCHIATRIC: No history of depression or anxiety.  ENDOCRINOLOGIC: No reports of sweating, cold or heat intolerance. No polyuria or polydipsia.  Marland Kitchen   Physical Examination Filed Vitals:   03/22/15 0816  BP: 124/68  Pulse: 54   Filed Weights   03/22/15 0816  Weight: 170 lb 3.2 oz (77.202 kg)    Gen: resting comfortably, no acute distress HEENT: no scleral icterus, pupils equal round and reactive, no palptable cervical adenopathy,  CV: regular rhythm, rate 55, no m/r/g, noJVD Resp: Clear to auscultation bilaterally GI: abdomen is soft, non-tender, non-distended, normal bowel sounds, no hepatosplenomegaly MSK: extremities are warm, no edema.  Skin: warm, no rash Neuro:  no focal deficits Psych: appropriate affect   Diagnostic Studies 12/2000 Cath PROCEDURES PERFORMED: 1. Left heart catheterization. 2. Selective coronary arteriography. 3. Selective left ventriculography. 4. Subclavian angiography.  DESCRIPTION OF PROCEDURE: The procedure was performed from the right femoral artery using 6-French catheters. She tolerated the procedure well without complications. She did have elevated blood pressures. She was given two doses of labetalol 20 mg IV, plus an additional dose of 5 mg of intravenous metoprolol. There were no complications from the procedure and she was taken to the holding area in satisfactory clinical condition.  HEMODYNAMIC DATA: Central aortic pressure was 190/83. LV  pressure was 190/20. There was no gradient on pullback across the aortic valve.  ANGIOGRAPHIC DATA: 1. Left ventriculography was performed in the RAO projection. Overall  ventricular systolic function was normal. No segmental abnormalities  or contraction were identified. There did not appear to be significant  mitral regurgitation. Ejection fraction was calculated at 69%. 2. Subclavian angiography revealed a patent subclavian artery, as well as  an internal mammary artery. The internal mammary appeared to be suitable  for grafting. 3. The left main coronary artery was free of critical disease. 4. The left anterior descending artery coursed to the apex where it wrapped  the apical tip. The LAD had about a 50% area of narrowing beyond the  origin of the first diagonal. The first diagonal itself had about 50%  narrowing. Distal to this, there was a segmental area of plaquing leading  into an approximate 90% stenosis just after the large anterolateral Harlynn Kimbell.  This anterolateral Takeem Krotzer itself  was a large caliber vessel that had about  80% proximal narrowing. Both vessels appeared to be suitable for grafting. 5. The circumflex artery had mild plaquing of approximately 20% to 30% prior  to the first marginal. The first marginal itself had about 50% ostial and  then a mid 60% stenosis. This vessel also appeared to be suitable for  grafting. 6. The right coronary artery was a large dominant vessel providing a moderate  sized posterior descending and a very large posterolateral Marshal Schrecengost. The  right coronary artery had a 20% to 30% narrowing in the proximal vessel.  In the mid vessel was a focal 90% stenosis.  CONCLUSIONS: 1. Preserved left ventricular function. 2. Patent internal mammary artery. 3. 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 4. Moderate stenosis of the circumflex artery. 5. High-grade stenosis of the mid  right coronary artery that is suitable  for intervention.  Cath report  2002 recs: The patient has three-vessel disease. The right is suitable for intervention, but the LAD is a complex lesion with a high risk of target vessel revascularization characterized by a long lesion and small caliber vessel. Based on these risk factors and the multivessel disease, we likely will recommend revascularization surgery. I will notify her primary care physician and we will get a surgical consultation.DD: 12/20/00 TD: 12/20/00 Job: [id number]              Assessment and Plan  1. CAD - no current symptoms - continue current meds  2. Carotid stenosis - continue to follow with vacular  3. Hyperlipidemia - in setting of known CAD change to high dose statin, will start atorvastatin 80mg  daily  4. HTN - will decrease lopressor in setting of bradycardia to 25mg  bid. He will keep bp log x 2 weeks and submit  5. Sinus bradycardia - no significant symptoms. Rate 48 by EKG. Will decrease lopressor to 25mg  bid.    F/u 6 months   Antoine Poche, M.D.

## 2015-04-04 ENCOUNTER — Other Ambulatory Visit (HOSPITAL_COMMUNITY): Payer: Self-pay | Admitting: Family Medicine

## 2015-04-04 DIAGNOSIS — Z09 Encounter for follow-up examination after completed treatment for conditions other than malignant neoplasm: Secondary | ICD-10-CM

## 2015-04-08 ENCOUNTER — Telehealth: Payer: Self-pay | Admitting: *Deleted

## 2015-04-08 ENCOUNTER — Encounter: Payer: Self-pay | Admitting: Vascular Surgery

## 2015-04-08 NOTE — Telephone Encounter (Signed)
BP Log placed on Dr. Verna CzechBranch's Desk

## 2015-04-12 ENCOUNTER — Telehealth: Payer: Self-pay

## 2015-04-12 ENCOUNTER — Encounter: Payer: Medicare Other | Admitting: Vascular Surgery

## 2015-04-12 ENCOUNTER — Ambulatory Visit (INDEPENDENT_AMBULATORY_CARE_PROVIDER_SITE_OTHER): Payer: Medicare Other | Admitting: Vascular Surgery

## 2015-04-12 ENCOUNTER — Encounter: Payer: Self-pay | Admitting: Vascular Surgery

## 2015-04-12 VITALS — BP 159/74 | HR 49 | Ht 68.5 in | Wt 173.0 lb

## 2015-04-12 DIAGNOSIS — I6521 Occlusion and stenosis of right carotid artery: Secondary | ICD-10-CM

## 2015-04-12 DIAGNOSIS — I6529 Occlusion and stenosis of unspecified carotid artery: Secondary | ICD-10-CM | POA: Insufficient documentation

## 2015-04-12 NOTE — Telephone Encounter (Signed)
LM on pt's private vm Dr Verna CzechBranch's note

## 2015-04-12 NOTE — Progress Notes (Signed)
Subjective:     Patient ID: Monica Stafford, female   DOB: 1941/04/23, 74 y.o.   MRN: [id number]  HPI  This 74 year old female is evaluated for carotid occlusive disease referred by Dr. Sudie BaileyKnowlton. Patient is known to me having undergone left carotid endarterectomy in 2002. She has done well since that point with no neurologic symptoms including lateralizing weakness, aphasia, amaurosis fugax, diplopia, blurred vision, or syncope. She recently had a carotid ultrasound ordered which revealed a 50-70% right ICA stenosis and she was referred for further evaluation. She has not had regular follow-up since her original surgery.  Past Medical History  Diagnosis Date  . Hypertension   . Hyperlipidemia   . Sleep apnea     mild  . Peripheral vascular disease [facility](HCC)     Social History  Substance Use Topics  . Smoking status: Never Smoker   . Smokeless tobacco: Not on file  . Alcohol Use: No    Family History  Problem Relation Age of Onset  . Heart disease Father     before age 75  . Heart attack Father     Allergies  Allergen Reactions  . Sulfonamide Derivatives Itching     Current outpatient prescriptions:  .  alendronate (FOSAMAX) 70 MG tablet, Take 70 mg by mouth every 7 (seven) days. Take with a full glass of water on an empty stomach. Usually on Monday, Disp: , Rfl:  .  amLODipine (NORVASC) 5 MG tablet, Take 5 mg by mouth daily., Disp: , Rfl:  .  aspirin EC 81 MG tablet, Take 81 mg by mouth daily., Disp: , Rfl:  .  atorvastatin (LIPITOR) 80 MG tablet, Take 1 tablet (80 mg total) by mouth daily., Disp: 90 tablet, Rfl: 3 .  Calcium Carbonate-Vitamin D (CALTRATE 600+D) 600-400 MG-UNIT per tablet, Take 1 tablet by mouth 3 (three) times daily. , Disp: , Rfl:  .  cholecalciferol (VITAMIN D) 1000 UNITS tablet, Take 1,000 Units by mouth daily., Disp: , Rfl:  .  estradiol (ESTRACE) 0.1 MG/GM vaginal cream, Place 1 g vaginally every other day. , Disp: , Rfl:  .  fish oil-omega-3 fatty acids  1000 MG capsule, Take 1 g by mouth daily. , Disp: , Rfl:  .  furosemide (LASIX) 40 MG tablet, Take 20 mg by mouth daily., Disp: , Rfl:  .  ibuprofen (ADVIL,MOTRIN) 200 MG tablet, Take 400 mg by mouth every 6 (six) hours as needed. For pain, Disp: , Rfl:  .  lisinopril (PRINIVIL,ZESTRIL) 40 MG tablet, Take 40 mg by mouth daily., Disp: , Rfl:  .  metoprolol tartrate (LOPRESSOR) 25 MG tablet, Take 1 tablet (25 mg total) by mouth 2 (two) times daily., Disp: 180 tablet, Rfl: 3 .  Misc Natural Products (OSTEO BI-FLEX ADV DOUBLE ST PO), Take 2 tablets by mouth daily. , Disp: , Rfl:  .  Multiple Vitamin (MULTIVITAMIN WITH MINERALS) TABS, Take 1 tablet by mouth daily., Disp: , Rfl:  .  potassium chloride (MICRO-K) 10 MEQ CR capsule, Take 10 mEq by mouth 2 (two) times daily., Disp: , Rfl:  .  raloxifene (EVISTA) 60 MG tablet, Take 60 mg by mouth daily., Disp: , Rfl:  .  triamterene-hydrochlorothiazide (MAXZIDE) 75-50 MG per tablet, Take 0.5 tablets by mouth every other day., Disp: , Rfl:   Filed Vitals:   04/12/15 1130 04/12/15 1134  BP: 166/78 159/74  Pulse: 49   Height: 5' 8.5" (1.74 m)   Weight: 173 lb (78.472 kg)   SpO2: 99%  Body mass index is 25.92 kg/(m^2).          Review of Systems Denies chest pain, dyspnea on exertion, PND, orthopnea. Does have occasional back discomfort due to arthritis. No history of tobacco abuse. Other systems negative and a complete review of systems     Objective:   Physical Exam BP 159/74 mmHg  Pulse 49  Ht 5' 8.5" (1.74 m)  Wt 173 lb (78.472 kg)  BMI 25.92 kg/m2  SpO2 99%  Gen.-alert and oriented x3 in no apparent distress HEENT normal for age Lungs no rhonchi or wheezing Cardiovascular regular rhythm no murmurs carotid pulses 3+ palpable no bruits audible Abdomen soft nontender no palpable masses Musculoskeletal free of  major deformities Skin clear -no rashes Neurologic normal Lower extremities 3+ femoral and dorsalis pedis pulses  palpable bilaterally with no edema   I reviewed the carotid duplex exam report which was performed 02/22/2015 by [facility] radiology. The left carotid endarterectomy site is widely patent. The right internal carotid artery has mild stenosis in the 40-50% range with a peak systolic velocity of only 156 cm/s with a diastolic velocity of 29 cm/s.       Assessment:      status post left carotid endarterectomy in 2002 with widely patent carotid endarterectomy site-asymptomatic Mild to moderate right ICA stenosis -50% by recent duplex scan  upper tension well controlled  chronic arthritis with back discomfort     Plan:      no indication for any intervention regarding carotid occlusive disease which is asymptomatic Patient will return in 1 year to see nurse practitioner with carotid duplex examination office unless she develops any specific neurologic symptoms in the interim Continue daily aspirin   continue lipid lowering drugs

## 2015-04-12 NOTE — Addendum Note (Signed)
Addended by: Sharee PimpleMCCHESNEY, MARILYN K on: 04/12/2015 05:36 PM   Modules accepted: Orders

## 2015-04-12 NOTE — Telephone Encounter (Signed)
-----   Message from Antoine PocheJonathan F Branch, MD sent at 04/12/2015  8:30 AM EDT ----- BP log reviewed. Some occasional high bp's and low heart rates but overall looks ok. No med changes  Monica FerryJ Branch MD

## 2015-05-10 ENCOUNTER — Ambulatory Visit (HOSPITAL_COMMUNITY)
Admission: RE | Admit: 2015-05-10 | Discharge: 2015-05-10 | Disposition: A | Discharge status: Home / Self Care | Payer: Medicare Other | Source: Ambulatory Visit | Attending: Family Medicine | Admitting: Family Medicine

## 2015-05-10 DIAGNOSIS — Z09 Encounter for follow-up examination after completed treatment for conditions other than malignant neoplasm: Secondary | ICD-10-CM

## 2015-05-10 DIAGNOSIS — N63 Unspecified lump in breast: Secondary | ICD-10-CM | POA: Insufficient documentation

## 2015-10-18 ENCOUNTER — Encounter: Payer: Self-pay | Admitting: Cardiology

## 2015-10-18 ENCOUNTER — Ambulatory Visit (INDEPENDENT_AMBULATORY_CARE_PROVIDER_SITE_OTHER): Payer: Medicare HMO | Admitting: Cardiology

## 2015-10-18 VITALS — BP 150/62 | HR 55 | Ht 68.0 in | Wt 175.0 lb

## 2015-10-18 DIAGNOSIS — E785 Hyperlipidemia, unspecified: Secondary | ICD-10-CM | POA: Diagnosis not present

## 2015-10-18 DIAGNOSIS — I10 Essential (primary) hypertension: Secondary | ICD-10-CM

## 2015-10-18 DIAGNOSIS — I2510 Atherosclerotic heart disease of native coronary artery without angina pectoris: Secondary | ICD-10-CM

## 2015-10-18 DIAGNOSIS — I6529 Occlusion and stenosis of unspecified carotid artery: Secondary | ICD-10-CM | POA: Diagnosis not present

## 2015-10-18 MED ORDER — AMLODIPINE BESYLATE 10 MG PO TABS: 10.0000 mg | ORAL_TABLET | Freq: Every day | ORAL | Status: DC

## 2015-10-18 NOTE — Patient Instructions (Signed)
Medication Instructions:  INCREASE NORVASC to 10 MG DAILY  Labwork: WILL REQUEST LAB WORK FROM PCP.   Testing/Procedures: NONE  Follow-Up: Your physician wants you to follow-up in: 6 MONTHS WITH DR. BRANCH.  You will receive a reminder letter in the mail two months in advance. If you don't receive a letter, please call our office to schedule the follow-up appointment.   Any Other Special Instructions Will Be Listed Below (If Applicable).     If you need a refill on your cardiac medications before your next appointment, please call your pharmacy.

## 2015-10-18 NOTE — Progress Notes (Signed)
Patient ID: Monica Stafford, female   DOB: August 22, 1940, 75 y.o.   MRN: [id number]     Clinical Summary [name] is a 75 y.o.female seen today as a new patient for the following medical problems  1. CAD - history of CABG in 2002 (LIMA-LAD,RIMA-RCA,SVG-OM1,SVG-D2). LVEF 69% by LVgram in 2002 - denies any chest pain, No SOB or DOE - compliant with meds   2. Carotid stenosis - history of left CEA 12/2000 - 02/2015 right 50-70% at right carotid bulb bifurcation, no significant disease on left.  - followed by vascular  3. Hyperlipidemia - 07/2014 TC 129 HDL 49 TGs 108 LDL 58 - compliant with statin  4.HTN - home numbers 140s-150s/60-70s - compliant with meds  5. Sinus bradycardia - denies any lightheadness or dizziness.  - last visit we decreased her lopressor to 25mg  bid, still with mild bradycardia at times.   Past Medical History  Diagnosis Date  . Hypertension   . Hyperlipidemia   . Sleep apnea     mild  . Peripheral vascular disease (HCC)      Allergies  Allergen Reactions  . Sulfonamide Derivatives Itching     Current Outpatient Prescriptions  Medication Sig Dispense Refill  . alendronate (FOSAMAX) 70 MG tablet Take 70 mg by mouth every 7 (seven) days. Take with a full glass of water on an empty stomach. Usually on Monday    . amLODipine (NORVASC) 5 MG tablet Take 5 mg by mouth daily.    Marland Kitchen. aspirin EC 81 MG tablet Take 81 mg by mouth daily.    Marland Kitchen. atorvastatin (LIPITOR) 80 MG tablet Take 1 tablet (80 mg total) by mouth daily. 90 tablet 3  . Calcium Carbonate-Vitamin D (CALTRATE 600+D) 600-400 MG-UNIT per tablet Take 1 tablet by mouth 3 (three) times daily.     . cholecalciferol (VITAMIN D) 1000 UNITS tablet Take 1,000 Units by mouth daily.    Marland Kitchen. estradiol (ESTRACE) 0.1 MG/GM vaginal cream Place 1 g vaginally every other day.     . fish oil-omega-3 fatty acids 1000 MG capsule Take 1 g by mouth daily.     . furosemide (LASIX) 40 MG tablet Take 20 mg by mouth  daily.    Marland Kitchen. ibuprofen (ADVIL,MOTRIN) 200 MG tablet Take 400 mg by mouth every 6 (six) hours as needed. For pain    . lisinopril (PRINIVIL,ZESTRIL) 40 MG tablet Take 40 mg by mouth daily.    . metoprolol tartrate (LOPRESSOR) 25 MG tablet Take 1 tablet (25 mg total) by mouth 2 (two) times daily. 180 tablet 3  . Misc Natural Products (OSTEO BI-FLEX ADV DOUBLE ST PO) Take 2 tablets by mouth daily.     . Multiple Vitamin (MULTIVITAMIN WITH MINERALS) TABS Take 1 tablet by mouth daily.    . potassium chloride (MICRO-K) 10 MEQ CR capsule Take 10 mEq by mouth 2 (two) times daily.    . raloxifene (EVISTA) 60 MG tablet Take 60 mg by mouth daily.    Marland Kitchen. triamterene-hydrochlorothiazide (MAXZIDE) 75-50 MG per tablet Take 0.5 tablets by mouth every other day.     No current facility-administered medications for this visit.     Past Surgical History  Procedure Laterality Date  . Cardiac catheterization    . Abdominal hysterectomy  1998    [facility]  . Coronary artery [facility]  [facility]  . Cataract extraction w/phaco  12/10/2011    Procedure: CATARACT EXTRACTION PHACO AND INTRAOCULAR LENS PLACEMENT (  IOC);  Surgeon: Gemma Payor, MD;  Location: [facility];  Service: Ophthalmology;  Laterality: Right;  CDE:10.40  . Cataract extraction w/phaco  01/07/2012    Procedure: CATARACT EXTRACTION PHACO AND INTRAOCULAR LENS PLACEMENT (IOC);  Surgeon: Gemma Payor, MD;  Location: [facility];  Service: Ophthalmology;  Laterality: Left;  CDE 8.38     Allergies  Allergen Reactions  . Sulfonamide Derivatives Itching      Family History  Problem Relation Age of Onset  . Heart disease Father     before age 53  . Heart attack Father      Social History [name] reports that she has never smoked. She does not have any smokeless tobacco history on file. [name] reports that she does not drink alcohol.   Review of Systems CONSTITUTIONAL: No weight loss, fever, chills, weakness  or fatigue.  HEENT: Eyes: No visual loss, blurred vision, double vision or yellow sclerae.No hearing loss, sneezing, congestion, runny nose or sore throat.  SKIN: No rash or itching.  CARDIOVASCULAR: per HPI RESPIRATORY: No shortness of breath, cough or sputum.  GASTROINTESTINAL: No anorexia, nausea, vomiting or diarrhea. No abdominal pain or blood.  GENITOURINARY: No burning on urination, no polyuria NEUROLOGICAL: No headache, dizziness, syncope, paralysis, ataxia, numbness or tingling in the extremities. No change in bowel or bladder control.  MUSCULOSKELETAL: No muscle, back pain, joint pain or stiffness.  LYMPHATICS: No enlarged nodes. No history of splenectomy.  PSYCHIATRIC: No history of depression or anxiety.  ENDOCRINOLOGIC: No reports of sweating, cold or heat intolerance. No polyuria or polydipsia.  Marland Kitchen   Physical Examination Filed Vitals:   10/18/15 0940  BP: 150/62  Pulse: 55   Filed Vitals:   10/18/15 0940  Height: 5\' 8"  (1.727 m)  Weight: 175 lb (79.379 kg)    Gen: resting comfortably, no acute distress HEENT: no scleral icterus, pupils equal round and reactive, no palptable cervical adenopathy,  CV: RRR, no m/r/g, no jvd Resp: Clear to auscultation bilaterally GI: abdomen is soft, non-tender, non-distended, normal bowel sounds, no hepatosplenomegaly MSK: extremities are warm, no edema.  Skin: warm, no rash Neuro:  no focal deficits Psych: appropriate affect   Diagnostic Studies 12/2000 Cath PROCEDURES PERFORMED: 1. Left heart catheterization. 2. Selective coronary arteriography. 3. Selective left ventriculography. 4. Subclavian angiography.  DESCRIPTION OF PROCEDURE: The procedure was performed from the right femoral artery using 6-French catheters. She tolerated the procedure well without complications. She did have elevated blood pressures. She was given two doses of labetalol 20 mg IV, plus an additional dose of 5 mg of intravenous metoprolol.  There were no complications from the procedure and she was taken to the holding area in satisfactory clinical condition.  HEMODYNAMIC DATA: Central aortic pressure was 190/83. LV pressure was 190/20. There was no gradient on pullback across the aortic valve.  ANGIOGRAPHIC DATA: 1. Left ventriculography was performed in the RAO projection. Overall  ventricular systolic function was normal. No segmental abnormalities  or contraction were identified. There did not appear to be significant  mitral regurgitation. Ejection fraction was calculated at 69%. 2. Subclavian angiography revealed a patent subclavian artery, as well as  an internal mammary artery. The internal mammary appeared to be suitable  for grafting. 3. The left main coronary artery was free of critical disease. 4. The left anterior descending artery coursed to the apex where it wrapped  the apical tip. The LAD had about a 50% area of narrowing beyond the  origin of the first diagonal. The  first diagonal itself had about 50%  narrowing. Distal to this, there was a segmental area of plaquing leading  into an approximate 90% stenosis just after the large anterolateral Lailanie Hasley.  This anterolateral Taelar Gronewold itself was a large caliber vessel that had about  80% proximal narrowing. Both vessels appeared to be suitable for grafting. 5. The circumflex artery had mild plaquing of approximately 20% to 30% prior  to the first marginal. The first marginal itself had about 50% ostial and  then a mid 60% stenosis. This vessel also appeared to be suitable for  grafting. 6. The right coronary artery was a large dominant vessel providing a moderate  sized posterior descending and a very large posterolateral Frans Valente. The  right coronary artery had a 20% to 30% narrowing in the proximal vessel.  In the mid vessel was a focal 90% stenosis.  CONCLUSIONS: 1. Preserved left ventricular function. 2.  Patent internal mammary artery. 3. 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 4. Moderate stenosis of the circumflex artery. 5. High-grade stenosis of the mid right coronary artery that is suitable  for intervention.  Cath report 2002 recs: The patient has three-vessel disease. The right is suitable for intervention, but the LAD is a complex lesion with a high risk of target vessel revascularization characterized by a long lesion and small caliber vessel. Based on these risk factors and the multivessel disease, we likely will recommend revascularization surgery. I will notify her primary care physician and we will get a surgical consultation.DD: 12/20/00 TD: 12/20/00 Job: [id number]                Assessment and Plan  1. CAD - no current symptoms - we will continue current meds  2. Carotid stenosis - continue to follow with vacular  3. Hyperlipidemia - continue statin, we will request most recent labs from pcp  4. HTN - above goal, we will increase norvasc to  daily  5. Sinus bradycardia - no significant symptoms. Continue to monitor, if needed can stop beta blocker in the future. Continue now given her CAD history   F/u 6 months      Antoine Poche, M.D.

## 2016-01-17 ENCOUNTER — Encounter: Payer: Self-pay | Admitting: Cardiology

## 2016-01-17 ENCOUNTER — Ambulatory Visit (INDEPENDENT_AMBULATORY_CARE_PROVIDER_SITE_OTHER): Payer: Medicare HMO | Admitting: Cardiology

## 2016-01-17 DIAGNOSIS — R600 Localized edema: Secondary | ICD-10-CM

## 2016-01-17 DIAGNOSIS — I10 Essential (primary) hypertension: Secondary | ICD-10-CM | POA: Diagnosis not present

## 2016-01-17 DIAGNOSIS — I6523 Occlusion and stenosis of bilateral carotid arteries: Secondary | ICD-10-CM

## 2016-01-17 DIAGNOSIS — E785 Hyperlipidemia, unspecified: Secondary | ICD-10-CM | POA: Insufficient documentation

## 2016-01-17 DIAGNOSIS — Z951 Presence of aortocoronary bypass graft: Secondary | ICD-10-CM

## 2016-01-17 MED ORDER — AMLODIPINE BESYLATE 5 MG PO TABS: 5.0000 mg | ORAL_TABLET | Freq: Every day | ORAL | 1 refills | Status: DC

## 2016-01-17 NOTE — Assessment & Plan Note (Signed)
S/P RCEA 2002, followed by Dr Hart Rochester

## 2016-01-17 NOTE — Assessment & Plan Note (Signed)
Noticed after Norvasc increased to 10 mg

## 2016-01-17 NOTE — Assessment & Plan Note (Signed)
Norvasc increased LOV in May 2017

## 2016-01-17 NOTE — Patient Instructions (Signed)
Your physician recommends that you schedule a follow-up appointment in: late October, early November with Dr.Branch   DECREASE Amlodipine to 5 mg daily  Check your blood pressure at home a couple times a week.let us know if top number (systolic) is consistently over 145-150, or bottom number (diastolic) is over 85-90    Get FASTING blood work just  BEFORE next visit : lipids,CMET      Thank you for choosing [facility] !

## 2016-01-17 NOTE — Assessment & Plan Note (Signed)
LIMA-LAD, RIMA-RCA, SVG-OM1, SVG-Dx. Myoview low risk 2009 No angina

## 2016-01-17 NOTE — Assessment & Plan Note (Signed)
No recent lipids or LFTs in EPIC

## 2016-01-17 NOTE — Progress Notes (Signed)
01/17/2016 Monica Stafford   11/21/1940  [id number]  Primary Physician Monica Obey, MD Primary Cardiologist: Dr Monica Stafford  HPI:  Pleasant 75 y/o female with a history of CAD, s/p remote CABG as noted below, no real cardiac issues since. Dr Monica Stafford saw her in May and increased her Norvasc to 10 mg for better B/P control. The pt called the office complaining that she has noticed increased LE edema since this adjustment was made. She was added on to my scheduled for further evaluatiion. She denies any orthopnea, unusual dyspnea, or chest pain.    Current Outpatient Prescriptions  Medication Sig Dispense Refill  . alendronate (FOSAMAX) 70 MG tablet Take 70 mg by mouth every 7 (seven) days. Take with a full glass of water on an empty stomach. Usually on Monday    . amLODipine (NORVASC) 10 MG tablet Take 1 tablet (10 mg total) by mouth daily. 90 tablet 3  . aspirin EC 81 MG tablet Take 81 mg by mouth daily.    Marland Kitchen atorvastatin (LIPITOR) 80 MG tablet Take 1 tablet (80 mg total) by mouth daily. 90 tablet 3  . Calcium Carbonate-Vitamin D (CALTRATE 600+D) 600-400 MG-UNIT per tablet Take 1 tablet by mouth 3 (three) times daily.     . cholecalciferol (VITAMIN D) 1000 UNITS tablet Take 1,000 Units by mouth daily.    Marland Kitchen estradiol (ESTRACE) 0.1 MG/GM vaginal cream Place 1 g vaginally every other day.     . fish oil-omega-3 fatty acids 1000 MG capsule Take 1 g by mouth daily.     . furosemide (LASIX) 40 MG tablet Take 20 mg by mouth daily.    Marland Kitchen ibuprofen (ADVIL,MOTRIN) 200 MG tablet Take 400 mg by mouth every 6 (six) hours as needed. For pain    . lisinopril (PRINIVIL,ZESTRIL) 40 MG tablet Take 40 mg by mouth daily.    . metoprolol tartrate (LOPRESSOR) 25 MG tablet Take 1 tablet (25 mg total) by mouth 2 (two) times daily. 180 tablet 3  . Misc Natural Products (OSTEO BI-FLEX ADV DOUBLE ST PO) Take 2 tablets by mouth daily.     . Multiple Vitamin (MULTIVITAMIN WITH MINERALS) TABS Take 1 tablet by mouth  daily.    . potassium chloride (MICRO-K) 10 MEQ CR capsule Take 10 mEq by mouth 2 (two) times daily.    . raloxifene (EVISTA) 60 MG tablet Take 60 mg by mouth daily.    Marland Kitchen triamterene-hydrochlorothiazide (MAXZIDE) 75-50 MG per tablet Take 0.5 tablets by mouth every other day.     No current facility-administered medications for this visit.     Allergies  Allergen Reactions  . Sulfonamide Derivatives Itching    Social History   Social History  . Marital status: Divorced    Spouse name: N/A  . Number of children: N/A  . Years of education: N/A   Occupational History  . Not on file.   Social History Main Topics  . Smoking status: Never Smoker  . Smokeless tobacco: Never Used  . Alcohol use No  . Drug use: No  . Sexual activity: Not Currently   Other Topics Concern  . Not on file   Social History Narrative  . No narrative on file     Review of Systems: General: negative for chills, fever, night sweats or weight changes.  Cardiovascular: negative for chest pain, dyspnea on exertion, edema, orthopnea, palpitations, paroxysmal nocturnal dyspnea or shortness of breath Dermatological: negative for rash Respiratory: negative for cough or wheezing Urologic:  negative for hematuria Abdominal: negative for nausea, vomiting, diarrhea, bright red blood per rectum, melena, or hematemesis Neurologic: negative for visual changes, syncope, or dizziness All other systems reviewed and are otherwise negative except as noted above.    Blood pressure (!) 102/54, pulse (!) 50, height 5' 8.5" (1.74 m), weight 177 lb (80.3 kg), SpO2 94 %.  General appearance: alert, cooperative and no distress Neck: no carotid bruit, no JVD and LCEA scar Lungs: clear to auscultation bilaterally Heart: regular rate and rhythm Extremities: 1+ edma Skin: Skin color, texture, turgor normal. No rashes or lesions Neurologic: Grossly normal   ASSESSMENT AND PLAN:   Hx of CABG x 4 2002 LIMA-LAD, RIMA-RCA,  SVG-OM1, SVG-Dx. Myoview low risk 2009 No angina  Essential hypertension Norvasc increased LOV in May 2017  Carotid stenosis, asymptomatic S/P RCEA 2002, followed by Dr Monica Stafford  Dyslipidemia No recent lipids or LFTs in EPIC  Edema of both legs Noticed after Norvasc increased to 10 mg   PLAN  I repeated her B/P in the office -110/54. I suggested she decrease the Amlodipine to 5 mg daily, avoid salt, start a walking program, and follow her B/P at home. She'll let us know if her systolic is consistently > 145-150, or diastolic > 85-90. F/U CMET and lipids prior to seeing Dr Monica Stafford for her 6 month in Oct/Nov.   Corine Shelter PA-C 01/17/2016 4:20 PM

## 2016-02-14 LAB — COMPREHENSIVE METABOLIC PANEL
ALT: 39 U/L — ABNORMAL HIGH (ref 6–29)
AST: 45 U/L — ABNORMAL HIGH (ref 10–35)
Albumin: 3.7 g/dL (ref 3.6–5.1)
Alkaline Phosphatase: 74 U/L (ref 33–130)
BUN: 17 mg/dL (ref 7–25)
CO2: 33 mmol/L — ABNORMAL HIGH (ref 20–31)
Calcium: 9 mg/dL (ref 8.6–10.4)
Chloride: 104 mmol/L (ref 98–110)
Creat: 0.74 mg/dL (ref 0.60–0.93)
Glucose, Bld: 97 mg/dL (ref 65–99)
Potassium: 4 mmol/L (ref 3.5–5.3)
Sodium: 142 mmol/L (ref 135–146)
Total Bilirubin: 0.8 mg/dL (ref 0.2–1.2)
Total Protein: 6.3 g/dL (ref 6.1–8.1)

## 2016-02-14 LAB — LIPID PANEL
Cholesterol: 107 mg/dL — ABNORMAL LOW (ref 125–200)
HDL: 44 mg/dL — ABNORMAL LOW (ref >=46)
LDL Cholesterol: 44 mg/dL (ref <130)
Total CHOL/HDL Ratio: 2.4 Ratio (ref <=5.0)
Triglycerides: 93 mg/dL (ref <150)
VLDL: 19 mg/dL (ref <30)

## 2016-02-27 ENCOUNTER — Emergency Department (HOSPITAL_COMMUNITY): Payer: Medicare HMO | Admitting: Anesthesiology

## 2016-02-27 ENCOUNTER — Encounter (HOSPITAL_COMMUNITY)
Admission: EM | Disposition: A | Discharge status: Home / Self Care | Payer: Self-pay | Source: Home / Self Care | Attending: Emergency Medicine

## 2016-02-27 ENCOUNTER — Emergency Department (HOSPITAL_COMMUNITY): Payer: Medicare HMO

## 2016-02-27 ENCOUNTER — Encounter (HOSPITAL_COMMUNITY): Payer: Self-pay

## 2016-02-27 ENCOUNTER — Observation Stay (HOSPITAL_COMMUNITY)
Admission: EM | Admit: 2016-02-27 | Discharge: 2016-02-28 | Disposition: A | Discharge status: Home / Self Care | Payer: Medicare HMO | Attending: General Surgery | Admitting: General Surgery

## 2016-02-27 DIAGNOSIS — Z7982 Long term (current) use of aspirin: Secondary | ICD-10-CM | POA: Diagnosis not present

## 2016-02-27 DIAGNOSIS — I10 Essential (primary) hypertension: Secondary | ICD-10-CM

## 2016-02-27 DIAGNOSIS — K353 Acute appendicitis with localized peritonitis, without perforation or gangrene: Secondary | ICD-10-CM

## 2016-02-27 DIAGNOSIS — R1031 Right lower quadrant pain: Secondary | ICD-10-CM | POA: Diagnosis present

## 2016-02-27 DIAGNOSIS — Z79899 Other long term (current) drug therapy: Secondary | ICD-10-CM | POA: Insufficient documentation

## 2016-02-27 DIAGNOSIS — N390 Urinary tract infection, site not specified: Secondary | ICD-10-CM | POA: Diagnosis not present

## 2016-02-27 DIAGNOSIS — K3580 Unspecified acute appendicitis: Secondary | ICD-10-CM | POA: Diagnosis present

## 2016-02-27 DIAGNOSIS — Z791 Long term (current) use of non-steroidal anti-inflammatories (NSAID): Secondary | ICD-10-CM | POA: Diagnosis not present

## 2016-02-27 HISTORY — DX: Pneumonia, unspecified organism: J18.9

## 2016-02-27 HISTORY — DX: Unspecified osteoarthritis, unspecified site: M19.90

## 2016-02-27 HISTORY — PX: LAPAROSCOPIC APPENDECTOMY: SHX408

## 2016-02-27 LAB — CBC WITH DIFFERENTIAL/PLATELET
Basophils Absolute: 0.1 10*3/uL (ref 0.0–0.1)
Basophils Relative: 1 %
Eosinophils Absolute: 0.1 10*3/uL (ref 0.0–0.7)
Eosinophils Relative: 1 %
HCT: 40 % (ref 36.0–46.0)
Hemoglobin: 13.7 g/dL (ref 12.0–15.0)
Lymphocytes Relative: 13 %
Lymphs Abs: 1.7 10*3/uL (ref 0.7–4.0)
MCH: 34.5 pg — ABNORMAL HIGH (ref 26.0–34.0)
MCHC: 34.3 g/dL (ref 30.0–36.0)
MCV: 100.8 fL — ABNORMAL HIGH (ref 78.0–100.0)
Monocytes Absolute: 1.4 10*3/uL — ABNORMAL HIGH (ref 0.1–1.0)
Monocytes Relative: 11 %
Neutro Abs: 9.7 10*3/uL — ABNORMAL HIGH (ref 1.7–7.7)
Neutrophils Relative %: 74 %
Platelets: 166 10*3/uL (ref 150–400)
RBC: 3.97 MIL/uL (ref 3.87–5.11)
RDW: 13.1 % (ref 11.5–15.5)
WBC: 12.9 10*3/uL — ABNORMAL HIGH (ref 4.0–10.5)

## 2016-02-27 LAB — URINE MICROSCOPIC-ADD ON

## 2016-02-27 LAB — URINALYSIS, ROUTINE W REFLEX MICROSCOPIC
Bilirubin Urine: NEGATIVE
Glucose, UA: NEGATIVE mg/dL
Nitrite: NEGATIVE
Protein, ur: 30 mg/dL — AB
Specific Gravity, Urine: 1.01 (ref 1.005–1.030)
pH: 7 (ref 5.0–8.0)

## 2016-02-27 LAB — COMPREHENSIVE METABOLIC PANEL
ALT: 32 U/L (ref 14–54)
AST: 39 U/L (ref 15–41)
Albumin: 3.3 g/dL — ABNORMAL LOW (ref 3.5–5.0)
Alkaline Phosphatase: 60 U/L (ref 38–126)
Anion gap: 8 (ref 5–15)
BUN: 18 mg/dL (ref 6–20)
CO2: 29 mmol/L (ref 22–32)
Calcium: 8.3 mg/dL — ABNORMAL LOW (ref 8.9–10.3)
Chloride: 97 mmol/L — ABNORMAL LOW (ref 101–111)
Creatinine, Ser: 0.82 mg/dL (ref 0.44–1.00)
GFR calc Af Amer: >60 mL/min (ref >60)
GFR calc non Af Amer: >60 mL/min (ref >60)
Glucose, Bld: 126 mg/dL — ABNORMAL HIGH (ref 65–99)
Potassium: 3.2 mmol/L — ABNORMAL LOW (ref 3.5–5.1)
Sodium: 134 mmol/L — ABNORMAL LOW (ref 135–145)
Total Bilirubin: 1.4 mg/dL — ABNORMAL HIGH (ref 0.3–1.2)
Total Protein: 6.1 g/dL — ABNORMAL LOW (ref 6.5–8.1)

## 2016-02-27 LAB — LACTIC ACID, PLASMA: Lactic Acid, Venous: 0.8 mmol/L (ref 0.5–1.9)

## 2016-02-27 LAB — LIPASE, BLOOD: Lipase: 25 U/L (ref 11–51)

## 2016-02-27 SURGERY — APPENDECTOMY, LAPAROSCOPIC
Anesthesia: General | Site: Abdomen

## 2016-02-27 MED ORDER — GLYCOPYRROLATE 0.2 MG/ML IJ SOLN
INTRAMUSCULAR | Status: AC
Start: 2016-02-27 — End: 2016-02-27
  Filled 2016-02-27: qty 3

## 2016-02-27 MED ORDER — METOPROLOL TARTRATE 25 MG PO TABS
25.0000 mg | ORAL_TABLET | Freq: Two times a day (BID) | ORAL | Status: DC
  Administered 2016-02-27 – 2016-02-28 (×2): 25 mg via ORAL
  Filled 2016-02-27 (×2): qty 1

## 2016-02-27 MED ORDER — POVIDONE-IODINE 10 % EX OINT
TOPICAL_OINTMENT | CUTANEOUS | Status: AC
  Filled 2016-02-27: qty 1

## 2016-02-27 MED ORDER — ONDANSETRON HCL 4 MG/2ML IJ SOLN: 4.0000 mg | Freq: Four times a day (QID) | INTRAMUSCULAR | Status: DC | PRN

## 2016-02-27 MED ORDER — FENTANYL CITRATE (PF) 100 MCG/2ML IJ SOLN: 25.0000 ug | INTRAMUSCULAR | Status: DC | PRN

## 2016-02-27 MED ORDER — HYDROCODONE-ACETAMINOPHEN 5-325 MG PO TABS
1.0000 | ORAL_TABLET | ORAL | Status: DC | PRN
  Administered 2016-02-28: 2 via ORAL
  Filled 2016-02-27 (×2): qty 2

## 2016-02-27 MED ORDER — METRONIDAZOLE IN NACL 5-0.79 MG/ML-% IV SOLN
500.0000 mg | Freq: Three times a day (TID) | INTRAVENOUS | Status: DC
  Administered 2016-02-27 – 2016-02-28 (×2): 500 mg via INTRAVENOUS
  Filled 2016-02-27 (×2): qty 100

## 2016-02-27 MED ORDER — TRIAMTERENE-HCTZ 75-50 MG PO TABS
0.5000 | ORAL_TABLET | ORAL | Status: DC
  Administered 2016-02-28: 0.5 via ORAL
  Filled 2016-02-27: qty 1

## 2016-02-27 MED ORDER — LORAZEPAM 2 MG/ML IJ SOLN: 1.0000 mg | INTRAMUSCULAR | Status: DC | PRN

## 2016-02-27 MED ORDER — ROCURONIUM BROMIDE 50 MG/5ML IV SOLN
INTRAVENOUS | Status: AC
  Filled 2016-02-27: qty 1

## 2016-02-27 MED ORDER — PROPOFOL 10 MG/ML IV BOLUS
INTRAVENOUS | Status: AC
  Filled 2016-02-27: qty 20

## 2016-02-27 MED ORDER — CEFTRIAXONE SODIUM 2 G IJ SOLR
2.0000 g | INTRAMUSCULAR | Status: DC
  Administered 2016-02-28: 2 g via INTRAVENOUS
  Filled 2016-02-27 (×2): qty 2

## 2016-02-27 MED ORDER — POVIDONE-IODINE 10 % OINT PACKET
TOPICAL_OINTMENT | CUTANEOUS | Status: DC | PRN
  Administered 2016-02-27: 1 via TOPICAL

## 2016-02-27 MED ORDER — MIDAZOLAM HCL 5 MG/5ML IJ SOLN
INTRAMUSCULAR | Status: DC | PRN
  Administered 2016-02-27: 2 mg via INTRAVENOUS

## 2016-02-27 MED ORDER — CEFTRIAXONE SODIUM 1 G IJ SOLR
1.0000 g | Freq: Once | INTRAMUSCULAR | Status: AC
  Administered 2016-02-27: 1 g via INTRAVENOUS
  Filled 2016-02-27: qty 10

## 2016-02-27 MED ORDER — BUPIVACAINE HCL (PF) 0.5 % IJ SOLN
INTRAMUSCULAR | Status: AC
  Filled 2016-02-27: qty 30

## 2016-02-27 MED ORDER — PROPOFOL 10 MG/ML IV BOLUS
INTRAVENOUS | Status: DC | PRN
  Administered 2016-02-27: 150 mg via INTRAVENOUS

## 2016-02-27 MED ORDER — CHLORHEXIDINE GLUCONATE CLOTH 2 % EX PADS: 6.0000 | MEDICATED_PAD | Freq: Once | CUTANEOUS | Status: DC

## 2016-02-27 MED ORDER — FENTANYL CITRATE (PF) 100 MCG/2ML IJ SOLN
INTRAMUSCULAR | Status: AC
  Filled 2016-02-27: qty 2

## 2016-02-27 MED ORDER — SODIUM CHLORIDE 0.9 % IR SOLN
Status: DC | PRN
  Administered 2016-02-27: 500 mL

## 2016-02-27 MED ORDER — ACETAMINOPHEN 650 MG RE SUPP: 650.0000 mg | Freq: Four times a day (QID) | RECTAL | Status: DC | PRN

## 2016-02-27 MED ORDER — SUCCINYLCHOLINE 20MG/ML (10ML) SYRINGE FOR MEDFUSION PUMP - OPTIME
INTRAMUSCULAR | Status: DC | PRN
  Administered 2016-02-27: 100 mg via INTRAVENOUS

## 2016-02-27 MED ORDER — GLYCOPYRROLATE 0.2 MG/ML IJ SOLN
INTRAMUSCULAR | Status: DC | PRN
  Administered 2016-02-27: .5 mg via INTRAVENOUS

## 2016-02-27 MED ORDER — OXYCODONE HCL 5 MG PO TABS: 5.0000 mg | ORAL_TABLET | Freq: Once | ORAL | Status: DC | PRN

## 2016-02-27 MED ORDER — LISINOPRIL 10 MG PO TABS
40.0000 mg | ORAL_TABLET | Freq: Every day | ORAL | Status: DC
  Administered 2016-02-27: 40 mg via ORAL
  Filled 2016-02-27 (×2): qty 4

## 2016-02-27 MED ORDER — AMLODIPINE BESYLATE 5 MG PO TABS
5.0000 mg | ORAL_TABLET | Freq: Every day | ORAL | Status: DC
  Filled 2016-02-27: qty 1

## 2016-02-27 MED ORDER — SCOPOLAMINE 1 MG/3DAYS TD PT72
1.0000 | MEDICATED_PATCH | TRANSDERMAL | Status: DC
  Administered 2016-02-27: 1.5 mg via TRANSDERMAL

## 2016-02-27 MED ORDER — POTASSIUM CHLORIDE CRYS ER 10 MEQ PO TBCR
10.0000 meq | EXTENDED_RELEASE_TABLET | Freq: Two times a day (BID) | ORAL | Status: DC
  Administered 2016-02-27 – 2016-02-28 (×2): 10 meq via ORAL
  Filled 2016-02-27 (×3): qty 1

## 2016-02-27 MED ORDER — DIPHENHYDRAMINE HCL 50 MG/ML IJ SOLN: 12.5000 mg | Freq: Four times a day (QID) | INTRAMUSCULAR | Status: DC | PRN

## 2016-02-27 MED ORDER — NEOSTIGMINE METHYLSULFATE 10 MG/10ML IV SOLN
INTRAVENOUS | Status: AC
  Filled 2016-02-27: qty 1

## 2016-02-27 MED ORDER — ENOXAPARIN SODIUM 40 MG/0.4ML ~~LOC~~ SOLN
40.0000 mg | SUBCUTANEOUS | Status: DC
  Filled 2016-02-27: qty 0.4

## 2016-02-27 MED ORDER — MIDAZOLAM HCL 2 MG/2ML IJ SOLN
INTRAMUSCULAR | Status: AC
  Filled 2016-02-27: qty 2

## 2016-02-27 MED ORDER — MORPHINE SULFATE (PF) 2 MG/ML IV SOLN
2.0000 mg | Freq: Once | INTRAVENOUS | Status: AC
  Administered 2016-02-27: 2 mg via INTRAVENOUS
  Filled 2016-02-27: qty 1

## 2016-02-27 MED ORDER — SUCCINYLCHOLINE CHLORIDE 20 MG/ML IJ SOLN
INTRAMUSCULAR | Status: AC
  Filled 2016-02-27: qty 1

## 2016-02-27 MED ORDER — MORPHINE SULFATE (PF) 2 MG/ML IV SOLN: 2.0000 mg | INTRAVENOUS | Status: DC | PRN

## 2016-02-27 MED ORDER — BUPIVACAINE HCL (PF) 0.5 % IJ SOLN
INTRAMUSCULAR | Status: DC | PRN
  Administered 2016-02-27: 6 mL

## 2016-02-27 MED ORDER — RALOXIFENE HCL 60 MG PO TABS
60.0000 mg | ORAL_TABLET | Freq: Every day | ORAL | Status: DC
  Administered 2016-02-27 – 2016-02-28 (×2): 60 mg via ORAL
  Filled 2016-02-27 (×2): qty 1

## 2016-02-27 MED ORDER — ONDANSETRON 4 MG PO TBDP: 4.0000 mg | ORAL_TABLET | Freq: Four times a day (QID) | ORAL | Status: DC | PRN

## 2016-02-27 MED ORDER — SCOPOLAMINE 1 MG/3DAYS TD PT72
MEDICATED_PATCH | TRANSDERMAL | Status: AC
  Filled 2016-02-27: qty 1

## 2016-02-27 MED ORDER — SIMETHICONE 80 MG PO CHEW: 40.0000 mg | CHEWABLE_TABLET | Freq: Four times a day (QID) | ORAL | Status: DC | PRN

## 2016-02-27 MED ORDER — KETOROLAC TROMETHAMINE 30 MG/ML IJ SOLN
30.0000 mg | Freq: Once | INTRAMUSCULAR | Status: AC
  Administered 2016-02-27: 30 mg via INTRAVENOUS
  Filled 2016-02-27: qty 1

## 2016-02-27 MED ORDER — LACTATED RINGERS IV SOLN
INTRAVENOUS | Status: DC
  Administered 2016-02-27 (×2): via INTRAVENOUS

## 2016-02-27 MED ORDER — ROCURONIUM 10MG/ML (10ML) SYRINGE FOR MEDFUSION PUMP - OPTIME
INTRAVENOUS | Status: DC | PRN
  Administered 2016-02-27: 20 mg via INTRAVENOUS

## 2016-02-27 MED ORDER — SODIUM CHLORIDE 0.9 % IV SOLN
Freq: Once | INTRAVENOUS | Status: AC
  Administered 2016-02-27: 08:00:00 via INTRAVENOUS

## 2016-02-27 MED ORDER — ACETAMINOPHEN 325 MG PO TABS: 650.0000 mg | ORAL_TABLET | Freq: Four times a day (QID) | ORAL | Status: DC | PRN

## 2016-02-27 MED ORDER — LIDOCAINE HCL (PF) 1 % IJ SOLN
INTRAMUSCULAR | Status: AC
  Filled 2016-02-27: qty 5

## 2016-02-27 MED ORDER — ONDANSETRON HCL 4 MG/2ML IJ SOLN
4.0000 mg | Freq: Once | INTRAMUSCULAR | Status: AC
  Administered 2016-02-27: 4 mg via INTRAVENOUS
  Filled 2016-02-27: qty 2

## 2016-02-27 MED ORDER — IOPAMIDOL (ISOVUE-300) INJECTION 61%
100.0000 mL | Freq: Once | INTRAVENOUS | Status: AC | PRN
  Administered 2016-02-27: 100 mL via INTRAVENOUS

## 2016-02-27 MED ORDER — IOPAMIDOL (ISOVUE-300) INJECTION 61%
INTRAVENOUS | Status: AC
  Filled 2016-02-27: qty 30

## 2016-02-27 MED ORDER — NEOSTIGMINE METHYLSULFATE 10 MG/10ML IV SOLN
INTRAVENOUS | Status: DC | PRN
  Administered 2016-02-27: 3 mg via INTRAVENOUS

## 2016-02-27 MED ORDER — DIPHENHYDRAMINE HCL 12.5 MG/5ML PO ELIX: 12.5000 mg | ORAL_SOLUTION | Freq: Four times a day (QID) | ORAL | Status: DC | PRN

## 2016-02-27 MED ORDER — LACTATED RINGERS IV SOLN
INTRAVENOUS | Status: DC
  Administered 2016-02-27 – 2016-02-28 (×2): via INTRAVENOUS

## 2016-02-27 MED ORDER — FENTANYL CITRATE (PF) 100 MCG/2ML IJ SOLN
INTRAMUSCULAR | Status: DC | PRN
  Administered 2016-02-27 (×3): 50 ug via INTRAVENOUS

## 2016-02-27 MED ORDER — OXYCODONE HCL 5 MG/5ML PO SOLN: 5.0000 mg | Freq: Once | ORAL | Status: DC | PRN

## 2016-02-27 SURGICAL SUPPLY — 44 items
BAG HAMPER (MISCELLANEOUS) ×3 IMPLANT
CHLORAPREP W/TINT 26ML (MISCELLANEOUS) ×3 IMPLANT
CLOTH BEACON ORANGE TIMEOUT ST (SAFETY) ×3 IMPLANT
COVER LIGHT HANDLE STERIS (MISCELLANEOUS) ×6 IMPLANT
CUTTER FLEX LINEAR 45M (STAPLE) IMPLANT
CUTTER LINEAR ENDO 35 ART FLEX (STAPLE) ×3 IMPLANT
DECANTER SPIKE VIAL GLASS SM (MISCELLANEOUS) ×3 IMPLANT
ELECT REM PT RETURN 9FT ADLT (ELECTROSURGICAL) ×3
ELECTRODE REM PT RTRN 9FT ADLT (ELECTROSURGICAL) ×1 IMPLANT
EVACUATOR SMOKE 8.L (FILTER) ×3 IMPLANT
FORMALIN 10 PREFIL 120ML (MISCELLANEOUS) ×3 IMPLANT
GLOVE BIOGEL PI IND STRL 7.0 (GLOVE) ×2 IMPLANT
GLOVE BIOGEL PI INDICATOR 7.0 (GLOVE) ×4
GLOVE SURG SS PI 7.5 STRL IVOR (GLOVE) ×3 IMPLANT
GOWN STRL REUS W/ TWL XL LVL3 (GOWN DISPOSABLE) ×1 IMPLANT
GOWN STRL REUS W/TWL LRG LVL3 (GOWN DISPOSABLE) ×3 IMPLANT
GOWN STRL REUS W/TWL XL LVL3 (GOWN DISPOSABLE) ×2
INST SET LAPROSCOPIC AP (KITS) ×3 IMPLANT
IV NS IRRIG 3000ML ARTHROMATIC (IV SOLUTION) IMPLANT
KIT ROOM TURNOVER APOR (KITS) ×3 IMPLANT
MANIFOLD NEPTUNE II (INSTRUMENTS) ×3 IMPLANT
NEEDLE INSUFFLATION 14GA 120MM (NEEDLE) ×3 IMPLANT
NS IRRIG 1000ML POUR BTL (IV SOLUTION) ×3 IMPLANT
PACK LAP CHOLE LZT030E (CUSTOM PROCEDURE TRAY) ×3 IMPLANT
PAD ARMBOARD 7.5X6 YLW CONV (MISCELLANEOUS) ×3 IMPLANT
PENCIL HANDSWITCHING (ELECTRODE) ×3 IMPLANT
POUCH SPECIMEN RETRIEVAL 10MM (ENDOMECHANICALS) ×3 IMPLANT
RELOAD 45 VASCULAR/THIN (ENDOMECHANICALS) IMPLANT
RELOAD STAPLE TA45 3.5 REG BLU (ENDOMECHANICALS) IMPLANT
SET BASIN LINEN APH (SET/KITS/TRAYS/PACK) ×3 IMPLANT
SET TUBE IRRIG SUCTION NO TIP (IRRIGATION / IRRIGATOR) IMPLANT
SHEARS HARMONIC ACE PLUS 36CM (ENDOMECHANICALS) ×3 IMPLANT
SPONGE GAUZE 2X2 8PLY STER LF (GAUZE/BANDAGES/DRESSINGS) ×3
SPONGE GAUZE 2X2 8PLY STRL LF (GAUZE/BANDAGES/DRESSINGS) ×6 IMPLANT
STAPLER VISISTAT (STAPLE) ×3 IMPLANT
SUT VICRYL 0 UR6 27IN ABS (SUTURE) ×3 IMPLANT
TAPE CLOTH SURG 4X10 WHT LF (GAUZE/BANDAGES/DRESSINGS) ×3 IMPLANT
TRAY FOLEY CATH SILVER 16FR (SET/KITS/TRAYS/PACK) ×3 IMPLANT
TROCAR ENDO BLADELESS 11MM (ENDOMECHANICALS) ×3 IMPLANT
TROCAR ENDO BLADELESS 12MM (ENDOMECHANICALS) ×3 IMPLANT
TROCAR XCEL NON-BLD 5MMX100MML (ENDOMECHANICALS) ×3 IMPLANT
TUBING INSUFFLATION (TUBING) ×3 IMPLANT
WARMER LAPAROSCOPE (MISCELLANEOUS) ×3 IMPLANT
YANKAUER SUCT 12FT TUBE ARGYLE (SUCTIONS) ×3 IMPLANT

## 2016-02-27 NOTE — Anesthesia Preprocedure Evaluation (Signed)
Anesthesia Evaluation  Patient identified by MRN, date of birth, ID band Patient awake    Reviewed: Allergy & Precautions, H&P , NPO status , Patient's Chart, lab work & pertinent test results  Airway Mallampati: II   Neck ROM: full    Dental   Pulmonary sleep apnea ,    breath sounds clear to auscultation       Cardiovascular hypertension, + CAD, + CABG and + Peripheral Vascular Disease   Rhythm:regular Rate:Normal     Neuro/Psych    GI/Hepatic   Endo/Other    Renal/GU      Musculoskeletal   Abdominal   Peds  Hematology   Anesthesia Other Findings   Reproductive/Obstetrics                             Anesthesia Physical Anesthesia Plan  ASA: III and emergent  Anesthesia Plan: General   Post-op Pain Management:    Induction: Intravenous  Airway Management Planned: Oral ETT  Additional Equipment:   Intra-op Plan:   Post-operative Plan: Extubation in OR  Informed Consent: I have reviewed the patients History and Physical, chart, labs and discussed the procedure including the risks, benefits and alternatives for the proposed anesthesia with the patient or authorized representative who has indicated his/her understanding and acceptance.     Plan Discussed with: CRNA, Anesthesiologist and Surgeon  Anesthesia Plan Comments:         Anesthesia Quick Evaluation

## 2016-02-27 NOTE — ED Triage Notes (Signed)
Pt reports rlq pain and nausea since Saturday.  LBM was today and was normal per pt.  Denies any pain or difficulty urinating, denies any vaginal bleeding or discharge.

## 2016-02-27 NOTE — ED Provider Notes (Signed)
[facility] Provider Note   CSN: [id number] Arrival date & time: 02/27/16  0750  By signing my name below, I, Majel HomerPeyton Lee, attest that this documentation has been prepared under the direction and in the presence of Glynn OctaveStephen Albirta Rhinehart, MD . Electronically Signed: Majel HomerPeyton Lee, Scribe. 02/27/2016. 8:12 AM.  History   Chief Complaint Chief Complaint  Patient presents with  . Abdominal Pain   The history is provided by the patient. No language interpreter was used.   HPI Comments: Harmon Dunellie S Rousseau is a 75 y.o. female with PMHx of HTN and HLD, who presents to the Emergency Department complaining of persistent, RLQ abdominal pain that began 2 days ago. Pt reports her pain is exacerbated with movement. She states associated nausea, right leg pain and decreased appetite. She notes no alleviating factors. Pt states she has not taken any medication to relieve her pain. She reports her last bowel movement was yesterday. She denies recent injury or fall, weakness in her BLE, vomiting, diarrhea, constipation, fever, dysuria, hematuria, back pain, vaginal bleeding or discharge and PSHx to her abdomen.   PCP: Dr. Sudie BaileyKnowlton   Past Medical History:  Diagnosis Date  . Hyperlipidemia   . Hypertension   . Peripheral vascular disease (HCC)   . Sleep apnea    mild    Patient Active Problem List   Diagnosis Date Noted  . 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 Essential hypertension 01/17/2016  . Dyslipidemia 01/17/2016  . Edema of both legs 01/17/2016  . Carotid stenosis, asymptomatic 04/12/2015  . OBSTRUCTIVE SLEEP APNEA 09/10/2008    Past Surgical History:  Procedure Laterality Date  . ABDOMINAL HYSTERECTOMY  1998   [facility]  . CARDIAC CATHETERIZATION    . CATARACT EXTRACTION W/PHACO  12/10/2011   Procedure: CATARACT EXTRACTION PHACO AND INTRAOCULAR LENS PLACEMENT (IOC);  Surgeon: Gemma PayorKerry Hunt, MD;  Location: [facility];  Service: Ophthalmology;  Laterality: Right;  CDE:10.40  .  CATARACT EXTRACTION W/PHACO  01/07/2012   Procedure: CATARACT EXTRACTION PHACO AND INTRAOCULAR LENS PLACEMENT (IOC);  Surgeon: Gemma PayorKerry Hunt, MD;  Location: [facility];  Service: Ophthalmology;  Laterality: Left;  CDE 8.38  . CORONARY ARTERY BYPASS GRAFT  12/23/2000   [facility]    OB History    No data available     Home Medications    Prior to Admission medications   Medication Sig Start Date End Date Taking? Authorizing Provider  alendronate (FOSAMAX) 70 MG tablet Take 70 mg by mouth every 7 (seven) days. Take with a full glass of water on an empty stomach. Usually on Monday    Historical Provider, MD  amLODipine (NORVASC) 5 MG tablet Take 1 tablet (5 mg total) by mouth daily. 01/17/16   Abelino DerrickLuke K Kilroy, PA-C  aspirin EC 81 MG tablet Take 81 mg by mouth daily.    Historical Provider, MD  atorvastatin (LIPITOR) 80 MG tablet Take 1 tablet (80 mg total) by mouth daily. 03/22/15   Antoine PocheJonathan F Branch, MD  Calcium Carbonate-Vitamin D (CALTRATE 600+D) 600-400 MG-UNIT per tablet Take 1 tablet by mouth 3 (three) times daily.     Historical Provider, MD  cholecalciferol (VITAMIN D) 1000 UNITS tablet Take 1,000 Units by mouth daily.    Historical Provider, MD  estradiol (ESTRACE) 0.1 MG/GM vaginal cream Place 1 g vaginally every other day.     Historical Provider, MD  fish oil-omega-3 fatty acids 1000 MG capsule Take 1 g by mouth daily.     Historical  Provider, MD  furosemide (LASIX) 40 MG tablet Take 20 mg by mouth daily.    Historical Provider, MD  ibuprofen (ADVIL,MOTRIN) 200 MG tablet Take 400 mg by mouth every 6 (six) hours as needed. For pain    Historical Provider, MD  lisinopril (PRINIVIL,ZESTRIL) 40 MG tablet Take 40 mg by mouth daily.    Historical Provider, MD  metoprolol tartrate (LOPRESSOR) 25 MG tablet Take 1 tablet (25 mg total) by mouth 2 (two) times daily. 03/22/15   Antoine Poche, MD  Misc Natural Products (OSTEO BI-FLEX ADV DOUBLE ST PO) Take 2 tablets by mouth daily.     Historical  Provider, MD  Multiple Vitamin (MULTIVITAMIN WITH MINERALS) TABS Take 1 tablet by mouth daily.    Historical Provider, MD  potassium chloride (MICRO-K) 10 MEQ CR capsule Take 10 mEq by mouth 2 (two) times daily.    Historical Provider, MD  raloxifene (EVISTA) 60 MG tablet Take 60 mg by mouth daily.    Historical Provider, MD  triamterene-hydrochlorothiazide (MAXZIDE) 75-50 MG per tablet Take 0.5 tablets by mouth every other day.    Historical Provider, MD    Family History Family History  Problem Relation Age of Onset  . Heart disease Father     before age 67  . Heart attack Father     Social History Social History  Substance Use Topics  . Smoking status: Never Smoker  . Smokeless tobacco: Never Used  . Alcohol use No     Allergies   Sulfonamide derivatives   Review of Systems Review of Systems 10 systems reviewed and all are negative for acute change except as noted in the HPI.  Physical Exam Updated Vital Signs BP 161/70 (BP Location: Left Arm)   Pulse 69   Temp 98 F (36.7 C) (Oral)   Resp 18   Ht 5\' 8"  (1.727 m)   Wt 176 lb (79.8 kg)   SpO2 95%   BMI 26.76 kg/m   Physical Exam  Constitutional: She is oriented to person, place, and time. She appears well-developed and well-nourished. No distress.  HENT:  Head: Normocephalic and atraumatic.  Mouth/Throat: Oropharynx is clear and moist. No oropharyngeal exudate.  Eyes: Conjunctivae and EOM are normal. Pupils are equal, round, and reactive to light.  Neck: Normal range of motion. Neck supple.  No meningismus.  Cardiovascular: Normal rate, regular rhythm, normal heart sounds and intact distal pulses.   No murmur heard. Pulmonary/Chest: Effort normal and breath sounds normal. No respiratory distress.  Abdominal: Soft. There is tenderness. There is rebound and guarding.  Tender in RLQ with guarding and rebound, no CVA tenderness, intact DP and TP pulses   Musculoskeletal: Normal range of motion. She exhibits no  edema or tenderness.  Able to range right leg without difficulty   Neurological: She is alert and oriented to person, place, and time. No cranial nerve deficit. She exhibits normal muscle tone. Coordination normal.  No ataxia on finger to nose bilaterally. No pronator drift. 5/5 strength throughout. CN 2-12 intact.Equal grip strength. Sensation intact.   Skin: Skin is warm.  Psychiatric: She has a normal mood and affect. Her behavior is normal.  Nursing note and vitals reviewed.  ED Treatments / Results  Labs (all labs ordered are listed, but only abnormal results are displayed) Labs Reviewed  CBC WITH DIFFERENTIAL/PLATELET - Abnormal; Notable for the following:       Result Value   WBC 12.9 (*)    MCV 100.8 (*)  MCH 34.5 (*)    Neutro Abs 9.7 (*)    Monocytes Absolute 1.4 (*)    All other components within normal limits  COMPREHENSIVE METABOLIC PANEL - Abnormal; Notable for the following:    Sodium 134 (*)    Potassium 3.2 (*)    Chloride 97 (*)    Glucose, Bld 126 (*)    Calcium 8.3 (*)    Total Protein 6.1 (*)    Albumin 3.3 (*)    Total Bilirubin 1.4 (*)    All other components within normal limits  URINALYSIS, ROUTINE W REFLEX MICROSCOPIC (NOT AT [facility]) - Abnormal; Notable for the following:    Hgb urine dipstick TRACE (*)    Ketones, ur TRACE (*)    Protein, ur 30 (*)    Leukocytes, UA LARGE (*)    All other components within normal limits  URINE MICROSCOPIC-ADD ON - Abnormal; Notable for the following:    Squamous Epithelial / LPF TOO NUMEROUS TO COUNT (*)    Bacteria, UA MANY (*)    All other components within normal limits  URINE CULTURE  LIPASE, BLOOD  LACTIC ACID, PLASMA    EKG  EKG Interpretation  Date/Time:  Monday February 27 2016 11:09:03 EDT Ventricular Rate:  64 PR Interval:    QRS Duration: 105 QT Interval:  428 QTC Calculation: 442 R Axis:   89 Text Interpretation:  Sinus rhythm Prolonged PR interval Borderline right axis deviation No  significant change was found Confirmed by Manus Gunning  MD, Adalene Gulotta [phone]) on 02/27/2016 11:23:43 AM       Radiology Ct Abdomen Pelvis W Contrast  Result Date: 02/27/2016 CLINICAL DATA:  Acute right lower quadrant abdominal pain. EXAM: CT ABDOMEN AND PELVIS WITH CONTRAST TECHNIQUE: Multidetector CT imaging of the abdomen and pelvis was performed using the standard protocol following bolus administration of intravenous contrast. CONTRAST:  ISOVUE-300 IOPAMIDOL (ISOVUE-300) INJECTION 61% COMPARISON:  None. FINDINGS: Lower chest: Visualized lung bases are unremarkable. Hepatobiliary: No gallstones are noted.  The liver appears normal. Pancreas: Pancreatic calcifications are noted suggesting chronic pancreatitis. Spleen: Normal. Adrenals/Urinary Tract: Adrenal glands appear normal. No hydronephrosis or renal obstruction is noted. Right kidney appears normal. Mild left renal atrophy is noted. Small nonobstructive calculus is seen in lower pole of left kidney. Urinary bladder appears normal. Stomach/Bowel: There is no evidence of bowel obstruction. Appendicolith is noted in the proximal portion of the appendix. The appendix is enlarged with surrounding inflammation consistent with acute appendicitis. It appears to be retrocecal in position. Vascular/Lymphatic: Atherosclerosis of abdominal aorta is noted without aneurysm formation. No significant adenopathy is noted. Reproductive: Status post hysterectomy. Ovaries appear unremarkable. Other: No abnormal fluid collection is noted. Musculoskeletal: Mild degenerative disc disease is noted at L5-S1. 3.6 x 2.4 cm well-defined lucency with sclerotic margins is noted involving the posterior portion of the left iliac wing most consistent with benign etiology. IMPRESSION: Enlarged and inflamed appendix is noted with appendicolith consistent with acute appendicitis. It is retrocecal in position. Aortic atherosclerosis. Electronically Signed   By: Lupita Raider, M.D.   On:  02/27/2016 10:16   Dg Chest Port 1 View  Result Date: 02/27/2016 CLINICAL DATA:  Patient reports right lower quadrant pain and nausea since Saturday. 08/05/2009, 02/27/2016 EXAM: PORTABLE CHEST 1 VIEW COMPARISON:  08/05/2009 FINDINGS: There are postsurgical changes of the mediastinum again evident. There is mild atelectasis in the right lung base. Linear atelectasis or scarring again present in the left lung base and the right upper lobe,  similar compared to previous exam. No acute consolidation or effusion. Cardiomediastinal silhouette stable with atherosclerosis of the aorta. There is no pneumothorax. Mild biapical pleural thickening. IMPRESSION: 1. Mild atelectasis in the right lung base. Linear areas of scarring in the left lung base and right upper lobe similar compared to prior 2. No acute consolidation or infiltrate. Electronically Signed   By: Jasmine Pang M.D.   On: 02/27/2016 11:21    Procedures Procedures   Medications Ordered in ED Medications  iopamidol (ISOVUE-300) 61 % injection (not administered)  cefTRIAXone (ROCEPHIN) 1 g in dextrose 5 % 50 mL IVPB (not administered)  0.9 %  sodium chloride infusion ( Intravenous New Bag/Given 02/27/16 0824)  ondansetron (ZOFRAN) injection 4 mg (4 mg Intravenous Given 02/27/16 0824)  morphine 2 MG/ML injection 2 mg (2 mg Intravenous Given 02/27/16 0824)  iopamidol (ISOVUE-300) 61 % injection 100 mL (100 mLs Intravenous Contrast Given 02/27/16 0953)    Initial Impression / Assessment and Plan / ED Course  I have reviewed the triage vital signs and the nursing notes.  Pertinent labs & imaging results that were available during my care of the patient were reviewed by me and considered in my medical decision making (see chart for details).  Clinical Course  DIAGNOSTIC STUDIES:  Oxygen Saturation is 95% on RA, normal by my interpretation.    COORDINATION OF CARE:  8:09 AM Discussed treatment plan with pt at bedside and pt agreed to  plan.  Right lower quadrant pain for the past 2 days with nausea and decreased appetite. Leukocytosis noted. Patient's urinalysis is contaminated but does contain bacteria and white blood cells. Treat for possible UTI with rocephin, culture sent.  CT scan consistent with appendicitis, no perforation. Discussed with nurse for Dr. Lovell Sheehan who is in the operating room. He will see patient. Rocephin given for possible urinary tract infection  Patient to OR with Dr. Lovell Sheehan for appendectomy.   I personally performed the services described in this documentation, which was scribed in my presence. The recorded information has been reviewed and is accurate.   Final Clinical Impressions(s) / ED Diagnoses   Final diagnoses:  Acute appendicitis with localized peritonitis  Urinary tract infection without hematuria, site unspecified    New Prescriptions New Prescriptions   No medications on file     Glynn Octave, MD 02/27/16 1539

## 2016-02-27 NOTE — Anesthesia Postprocedure Evaluation (Signed)
Anesthesia Post Note  Patient: Monica Stafford  Procedure(s) Performed: Procedure(s) (LRB): APPENDECTOMY LAPAROSCOPIC (N/A)  Patient location during evaluation: PACU Anesthesia Type: General Level of consciousness: awake and alert and oriented Pain management: pain level controlled Vital Signs Assessment: post-procedure vital signs reviewed and stable Respiratory status: spontaneous breathing and patient connected to face mask oxygen Cardiovascular status: blood pressure returned to baseline Postop Assessment: no signs of nausea or vomiting Anesthetic complications: no    Last Vitals:  Vitals:   02/27/16 1325 02/27/16 1450  BP: 131/62 (!) 150/68  Pulse:  (!) 104  Resp: (!) 23   Temp:  36.4 C    Last Pain:  Vitals:   02/27/16 1245  TempSrc: Oral  PainSc:                  [name]

## 2016-02-27 NOTE — Transfer of Care (Signed)
Immediate Anesthesia Transfer of Care Note  Patient: Monica Stafford  Procedure(s) Performed: Procedure(s): APPENDECTOMY LAPAROSCOPIC (N/A)  Patient Location: PACU  Anesthesia Type:General  Level of Consciousness: awake  Airway & Oxygen Therapy: Patient Spontanous Breathing  Post-op Assessment: Report given to RN  Post vital signs: Reviewed and stable  Last Vitals:  Vitals:   02/27/16 1320 02/27/16 1325  BP: 139/60 131/62  Pulse:    Resp: 19 (!) 23  Temp:      Last Pain:  Vitals:   02/27/16 1245  TempSrc: Oral  PainSc:       Patients Stated Pain Goal: 7 (02/27/16 1245)  Complications: No apparent anesthesia complications

## 2016-02-27 NOTE — Anesthesia Procedure Notes (Signed)
Procedure Name: Intubation Date/Time: 02/27/2016 1:49 PM Performed by: Glynn OctaveANIEL, Equan Cogbill E Pre-anesthesia Checklist: Patient identified, Patient being monitored, Timeout performed, Emergency Drugs available and Suction available Patient Re-evaluated:Patient Re-evaluated prior to inductionOxygen Delivery Method: Circle system utilized Preoxygenation: Pre-oxygenation with 100% oxygen Intubation Type: IV induction, Rapid sequence and Cricoid Pressure applied Ventilation: Mask ventilation without difficulty Laryngoscope Size: Mac and 3 Grade View: Grade I Tube type: Oral Tube size: 7.0 mm Number of attempts: 1 Airway Equipment and Method: Stylet Placement Confirmation: ETT inserted through vocal cords under direct vision,  positive ETCO2 and breath sounds checked- equal and bilateral Secured at: 21 cm Tube secured with: Tape Dental Injury: Teeth and Oropharynx as per pre-operative assessment

## 2016-02-27 NOTE — ED Notes (Signed)
Report given to Britta MccreedyBarbara, [place] for [place].

## 2016-02-27 NOTE — Op Note (Signed)
Patient:  Monica Stafford  DOB:  Jan 08, 1941  MRN:  [id number]   Preop Diagnosis:  Acute appendicitis  Postop Diagnosis:  Same  Procedure:  Laparoscopic appendectomy  Surgeon:  Monica Stafford, M.D.  Anes:  Gen. endotracheal  Indications:  Patient is a 75 year old white female who presents with a two-day history of worsening right lower quadrant abdominal pain. CT scan the abdomen revealed acute appendicitis. The risks and benefits of the procedure including bleeding, infection, and the possibility of an open procedure were fully explained to the patient, who gave informed consent.  Procedure note:  The patient was placed the supine position. After induction of general endotracheal anesthesia, the abdomen was prepped and draped using usual sterile technique with DuraPrep. Surgical site confirmation was performed.  A supraumbilical incision was made down to the fascia. A Veress needle was introduced into the abdominal cavity and confirmation of placement was done using the saline drop test. The abdomen was then insufflated to 16 mmHg pressure. An 11 mm trocar was introduced into the abdominal cavity under direct visualization without difficulty. The patient was placed in deeper Trendelenburg position and an additional 12 mm trocar was placed the suprapubic region a 5 mm trocar was placed left lower quadrant region. The appendix was visualized and noted to be diffusely inflamed. The mesoappendix was divided using the harmonic scalpel. A standard Endo GIA was placed across the base the appendix and fired. The appendix was then removed from the abdominal cavity with an Endo Catch bag. The staple line was inspected and noted be intact and within normal limits. All fluid and air were then evacuated from the abdominal cavity prior to removal of the trochars.  All wounds were irrigated with normal saline. All wounds were injected with 0.5% Sensorcaine. The suprabuccal fashion was reapproximated using an 0  Vicryl interrupted suture. All skin incisions were closed using staples. Betadine ointment and dry sterile dressings were applied.  All tape and needle counts were correct at the end of the procedure. Patient was extubated in the operating room and transferred to PACU in stable condition.  Complications:  None  EBL:  Minimal  Specimen:  Appendix

## 2016-02-27 NOTE — H&P (Signed)
Monica Stafford is an 75 y.o. female.   Chief Complaint: Right lower quadrant abdominal pain HPI: Patient is a 75 year old white female who was in her usual state of health when 2 days ago she began experiencing lower abdominal pain. This continued throughout the weekend until it became localized to the right lower quadrant. She presented emergency room for further evaluation treatment. She denies any nausea or vomiting. No fever or chills have been noted. CT scan of the abdomen reveals acute appendicitis. An appendicolith is present.  Past Medical History:  Diagnosis Date  . Hyperlipidemia   . Hypertension   . Peripheral vascular disease ([place])   . Sleep apnea    mild    Past Surgical History:  Procedure Laterality Date  . ABDOMINAL HYSTERECTOMY  1998   [facility]  . CARDIAC CATHETERIZATION    . CATARACT EXTRACTION W/PHACO  12/10/2011   Procedure: CATARACT EXTRACTION PHACO AND INTRAOCULAR LENS PLACEMENT (IOC);  Surgeon: Tonny Branch, MD;  Location: [facility];  Service: Ophthalmology;  Laterality: Right;  CDE:10.40  . CATARACT EXTRACTION W/PHACO  01/07/2012   Procedure: CATARACT EXTRACTION PHACO AND INTRAOCULAR LENS PLACEMENT (IOC);  Surgeon: Tonny Branch, MD;  Location: [facility];  Service: Ophthalmology;  Laterality: Left;  CDE 8.38  . 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 Heart disease Father     before age 95  . Heart attack Father    Social History:  reports that she has never smoked. She has never used smokeless tobacco. She reports that she does not drink alcohol or use drugs.  Allergies:  Allergies  Allergen Reactions  . Sulfonamide Derivatives Itching     (Not in a hospital admission)  Results for orders placed or performed during the hospital encounter of 02/27/16 (from the past 48 hour(s))  Urinalysis, Routine w reflex microscopic (not at [facility])     Status: Abnormal   Collection Time:  02/27/16  7:59 AM  Result Value Ref Range   Color, Urine YELLOW YELLOW   APPearance CLEAR CLEAR   Specific Gravity, Urine 1.010 1.005 - 1.030   pH 7.0 5.0 - 8.0   Glucose, UA NEGATIVE NEGATIVE mg/dL   Hgb urine dipstick TRACE (A) NEGATIVE   Bilirubin Urine NEGATIVE NEGATIVE   Ketones, ur TRACE (A) NEGATIVE mg/dL   Protein, ur 30 (A) NEGATIVE mg/dL   Nitrite NEGATIVE NEGATIVE   Leukocytes, UA LARGE (A) NEGATIVE  Urine microscopic-add on     Status: Abnormal   Collection Time: 02/27/16  7:59 AM  Result Value Ref Range   Squamous Epithelial / LPF TOO NUMEROUS TO COUNT (A) NONE SEEN   WBC, UA TOO NUMEROUS TO COUNT 0 - 5 WBC/hpf   RBC / HPF 0-5 0 - 5 RBC/hpf   Bacteria, UA MANY (A) NONE SEEN  CBC with Differential     Status: Abnormal   Collection Time: 02/27/16  8:27 AM  Result Value Ref Range   WBC 12.9 (H) 4.0 - 10.5 K/uL   RBC 3.97 3.87 - 5.11 MIL/uL   Hemoglobin 13.7 12.0 - 15.0 g/dL   HCT 40.0 36.0 - 46.0 %   MCV 100.8 (H) 78.0 - 100.0 fL   MCH 34.5 (H) 26.0 - 34.0 pg   MCHC 34.3 30.0 - 36.0 g/dL   RDW 13.1 11.5 - 15.5 %   Platelets 166 150 - 400 K/uL   Neutrophils Relative %  74 %   Neutro Abs 9.7 (H) 1.7 - 7.7 K/uL   Lymphocytes Relative 13 %   Lymphs Abs 1.7 0.7 - 4.0 K/uL   Monocytes Relative 11 %   Monocytes Absolute 1.4 (H) 0.1 - 1.0 K/uL   Eosinophils Relative 1 %   Eosinophils Absolute 0.1 0.0 - 0.7 K/uL   Basophils Relative 1 %   Basophils Absolute 0.1 0.0 - 0.1 K/uL  Comprehensive metabolic panel     Status: Abnormal   Collection Time: 02/27/16  8:27 AM  Result Value Ref Range   Sodium 134 (L) 135 - 145 mmol/L   Potassium 3.2 (L) 3.5 - 5.1 mmol/L   Chloride 97 (L) 101 - 111 mmol/L   CO2 29 22 - 32 mmol/L   Glucose, Bld 126 (H) 65 - 99 mg/dL   BUN 18 6 - 20 mg/dL   Creatinine, Ser 0.82 0.44 - 1.00 mg/dL   Calcium 8.3 (L) 8.9 - 10.3 mg/dL   Total Protein 6.1 (L) 6.5 - 8.1 g/dL   Albumin 3.3 (L) 3.5 - 5.0 g/dL   AST 39 15 - 41 U/L   ALT 32 14 - 54 U/L    Alkaline Phosphatase 60 38 - 126 U/L   Total Bilirubin 1.4 (H) 0.3 - 1.2 mg/dL   GFR calc non Af Amer >60 >60 mL/min   GFR calc Af Amer >60 >60 mL/min    Comment: (NOTE) The eGFR has been calculated using the CKD EPI equation. This calculation has not been validated in all clinical situations. eGFR's persistently <60 mL/min signify possible Chronic Kidney Disease.    Anion gap 8 5 - 15  Lipase, blood     Status: None   Collection Time: 02/27/16  8:27 AM  Result Value Ref Range   Lipase 25 11 - 51 U/L  Lactic acid, plasma     Status: None   Collection Time: 02/27/16  9:05 AM  Result Value Ref Range   Lactic Acid, Venous 0.8 0.5 - 1.9 mmol/L   Ct Abdomen Pelvis W Contrast  Result Date: 02/27/2016 CLINICAL DATA:  Acute right lower quadrant abdominal pain. EXAM: CT ABDOMEN AND PELVIS WITH CONTRAST TECHNIQUE: Multidetector CT imaging of the abdomen and pelvis was performed using the standard protocol following bolus administration of intravenous contrast. CONTRAST:  100mL ISOVUE-300 IOPAMIDOL (ISOVUE-300) INJECTION 61% COMPARISON:  None. FINDINGS: Lower chest: Visualized lung bases are unremarkable. Hepatobiliary: No gallstones are noted.  The liver appears normal. Pancreas: Pancreatic calcifications are noted suggesting chronic pancreatitis. Spleen: Normal. Adrenals/Urinary Tract: Adrenal glands appear normal. No hydronephrosis or renal obstruction is noted. Right kidney appears normal. Mild left renal atrophy is noted. Small nonobstructive calculus is seen in lower pole of left kidney. Urinary bladder appears normal. Stomach/Bowel: There is no evidence of bowel obstruction. Appendicolith is noted in the proximal portion of the appendix. The appendix is enlarged with surrounding inflammation consistent with acute appendicitis. It appears to be retrocecal in position. Vascular/Lymphatic: Atherosclerosis of abdominal aorta is noted without aneurysm formation. No significant adenopathy is noted.  Reproductive: Status post hysterectomy. Ovaries appear unremarkable. Other: No abnormal fluid collection is noted. Musculoskeletal: Mild degenerative disc disease is noted at L5-S1. 3.6 x 2.4 cm well-defined lucency with sclerotic margins is noted involving the posterior portion of the left iliac wing most consistent with benign etiology. IMPRESSION: Enlarged and inflamed appendix is noted with appendicolith consistent with acute appendicitis. It is retrocecal in position. Aortic atherosclerosis. Electronically Signed   By: James  Green   Jr, M.D.   On: 02/27/2016 10:16    Review of Systems  Constitutional: Positive for malaise/fatigue.  HENT: Negative.   Eyes: Negative.   Respiratory: Negative.   Cardiovascular: Negative.   Gastrointestinal: Positive for abdominal pain. Negative for constipation, diarrhea, nausea and vomiting.  Genitourinary: Negative.   Musculoskeletal: Negative.   Skin: Negative.   All other systems reviewed and are negative.   Blood pressure 139/64, pulse 64, temperature 98 F (36.7 C), temperature source Oral, resp. rate 18, height 5' 8" (1.727 m), weight 79.8 kg (176 lb), SpO2 92 %. Physical Exam  Vitals reviewed. Constitutional: She is oriented to person, place, and time. She appears well-developed and well-nourished.  HENT:  Head: Normocephalic and atraumatic.  Neck: Normal range of motion. Neck supple.  Cardiovascular: Normal rate, regular rhythm and normal heart sounds.   Respiratory: Effort normal and breath sounds normal.  GI: Soft. She exhibits no distension and no mass. There is tenderness. There is no rebound and no guarding.  Tender in the right lower quadrant to deep palpation. No rigidity noted.  Neurological: She is alert and oriented to person, place, and time.  Skin: Skin is warm and dry.     Assessment/Plan Impression: Acute appendicitis Plan: Patient be taken to the operating room for laparoscopic appendectomy. The risks and benefits of the  procedure including bleeding, infection, and the possibility of an open procedure were fully explained to the patient, who gave informed consent.  JENKINS,MARK A, MD 02/27/2016, 11:02 AM   

## 2016-02-28 LAB — CBC
HCT: 35.4 % — ABNORMAL LOW (ref 36.0–46.0)
Hemoglobin: 11.8 g/dL — ABNORMAL LOW (ref 12.0–15.0)
MCH: 34.3 pg — ABNORMAL HIGH (ref 26.0–34.0)
MCHC: 33.3 g/dL (ref 30.0–36.0)
MCV: 102.9 fL — ABNORMAL HIGH (ref 78.0–100.0)
Platelets: 127 10*3/uL — ABNORMAL LOW (ref 150–400)
RBC: 3.44 MIL/uL — ABNORMAL LOW (ref 3.87–5.11)
RDW: 12.8 % (ref 11.5–15.5)
WBC: 8 10*3/uL (ref 4.0–10.5)

## 2016-02-28 LAB — BASIC METABOLIC PANEL
Anion gap: 7 (ref 5–15)
BUN: 19 mg/dL (ref 6–20)
CO2: 27 mmol/L (ref 22–32)
Calcium: 7.4 mg/dL — ABNORMAL LOW (ref 8.9–10.3)
Chloride: 101 mmol/L (ref 101–111)
Creatinine, Ser: 1.04 mg/dL — ABNORMAL HIGH (ref 0.44–1.00)
GFR calc Af Amer: 59 mL/min — ABNORMAL LOW (ref >60)
GFR calc non Af Amer: 51 mL/min — ABNORMAL LOW (ref >60)
Glucose, Bld: 106 mg/dL — ABNORMAL HIGH (ref 65–99)
Potassium: 3 mmol/L — ABNORMAL LOW (ref 3.5–5.1)
Sodium: 135 mmol/L (ref 135–145)

## 2016-02-28 MED ORDER — HYDROCODONE-ACETAMINOPHEN 5-325 MG PO TABS: 1.0000 | ORAL_TABLET | Freq: Four times a day (QID) | ORAL | 0 refills | Status: DC | PRN

## 2016-02-28 NOTE — Discharge Instructions (Signed)
Laparoscopic Appendectomy, Adult, Care After °Refer to this sheet in the next few weeks. These instructions provide you with information on caring for yourself after your procedure. Your caregiver may also give you more specific instructions. Your treatment has been planned according to current medical practices, but problems sometimes occur. Call your caregiver if you have any problems or questions after your procedure. °HOME CARE INSTRUCTIONS °· Do not drive while taking narcotic pain medicines. °· Use stool softener if you become constipated from your pain medicines. °· Change your bandages (dressings) as directed. °· Keep your wounds clean and dry. You may wash the wounds gently with soap and water. Gently pat the wounds dry with a clean towel. °· Do not take baths, swim, or use hot tubs for 10 days, or as instructed by your caregiver. °· Only take over-the-counter or prescription medicines for pain, discomfort, or fever as directed by your caregiver. °· You may continue your normal diet as directed. °· Do not lift more than 10 pounds (4.5 kg) or play contact sports for 3 weeks, or as directed. °· Slowly increase your activity after surgery. °· Take deep breaths to avoid getting a lung infection (pneumonia). °SEEK MEDICAL CARE IF: °· You have redness, swelling, or increasing pain in your wounds. °· You have pus coming from your wounds. °· You have drainage from a wound that lasts longer than 1 day. °· You notice a bad smell coming from the wounds or dressing. °· Your wound edges break open after stitches (sutures) have been removed. °· You notice increasing pain in the shoulders (shoulder strap areas) or near your shoulder blades. °· You develop dizzy episodes or fainting while standing. °· You develop shortness of breath. °· You develop persistent nausea or vomiting. °· You cannot control your bowel functions or lose your appetite. °· You develop diarrhea. °SEEK IMMEDIATE MEDICAL CARE IF:  °· You have a  fever. °· You develop a rash. °· You have difficulty breathing or sharp pains in your chest. °· You develop any reaction or side effects to medicines given. °MAKE SURE YOU: °· Understand these instructions. °· Will watch your condition. °· Will get help right away if you are not doing well or get worse. °  °This information is not intended to replace advice given to you by your health care provider. Make sure you discuss any questions you have with your health care provider. °  °Document Released: 06/04/2005 Document Revised: 10/19/2014 Document Reviewed: 11/22/2014 °Elsevier Interactive Patient Education ©2016 [employer] Inc. ° °

## 2016-02-28 NOTE — Discharge Summary (Signed)
Physician Discharge Summary  Patient ID: Monica Stafford S Eide MRN: [id number] DOB/AGE: 25-Jul-1940 75 y.o.  Admit date: 02/27/2016 Discharge date: 02/28/2016  Admission Diagnoses:Acute appendicitis  Discharge Diagnoses: Same Active Problems:   Acute appendicitis Hypertension, hypokalemia  Discharged Condition: good  Hospital Course: Patient is a 75 year old white female who presented emergency room with a 2 day history of worsening right lower quadrant abdominal pain. CT scan the abdomen revealed acute appendicitis. The patient was taken to the operating room on 02/27/2016 and underwent laparoscopic appendectomy. She tolerated the procedure well. Her postoperative course was unremarkable. Her diet was advanced without difficulty. Her leukocytosis resolved. She does have hypokalemia, but is on potassium supplementation. She is being discharged home on 02/28/2016 in good improving condition.  Treatments: surgery: Laparoscopic appendectomy on 02/27/2016  Discharge Exam: Blood pressure (!) 120/42, pulse 68, temperature 99.6 F (37.6 C), temperature source Oral, resp. rate 18, height 5\' 8"  (1.727 m), weight 79.8 kg (176 lb), SpO2 92 %. General appearance: alert, cooperative and no distress Resp: clear to auscultation bilaterally Cardio: regular rate and rhythm, S1, S2 normal, no murmur, click, rub or gallop GI: Soft, dressings dry and intact.  Disposition: 01-Home or Self Care     Medication List    TAKE these medications   alendronate 70 MG tablet Commonly known as:  FOSAMAX Take 70 mg by mouth every 7 (seven) days. Take with a full glass of water on an empty stomach. Usually on Monday   amLODipine 5 MG tablet Commonly known as:  NORVASC Take 1 tablet (5 mg total) by mouth daily.   aspirin EC 81 MG tablet Take 81 mg by mouth daily.   atorvastatin 80 MG tablet Commonly known as:  LIPITOR Take 1 tablet (80 mg total) by mouth daily.   CALTRATE 600+D 600-400 MG-UNIT  tablet Generic drug:  Calcium Carbonate-Vitamin D Take 1 tablet by mouth 3 (three) times daily.   cholecalciferol 1000 units tablet Commonly known as:  VITAMIN D Take 1,000 Units by mouth daily.   estradiol 0.1 MG/GM vaginal cream Commonly known as:  ESTRACE Place 1 g vaginally once a week.   fish oil-omega-3 fatty acids 1000 MG capsule Take 1 g by mouth daily.   furosemide 40 MG tablet Commonly known as:  LASIX Take 20 mg by mouth daily.   HYDROcodone-acetaminophen 5-325 MG tablet Commonly known as:  NORCO Take 1 tablet by mouth every 6 (six) hours as needed for moderate pain.   ibuprofen 200 MG tablet Commonly known as:  ADVIL,MOTRIN Take 400 mg by mouth every 6 (six) hours as needed. For pain   lisinopril 40 MG tablet Commonly known as:  PRINIVIL,ZESTRIL Take 40 mg by mouth daily.   metoprolol tartrate 25 MG tablet Commonly known as:  LOPRESSOR Take 1 tablet (25 mg total) by mouth 2 (two) times daily.   multivitamin with minerals Tabs tablet Take 1 tablet by mouth daily.   potassium chloride 10 MEQ CR capsule Commonly known as:  MICRO-K Take 10 mEq by mouth 2 (two) times daily.   raloxifene 60 MG tablet Commonly known as:  EVISTA Take 60 mg by mouth daily.   triamterene-hydrochlorothiazide 75-50 MG tablet Commonly known as:  MAXZIDE Take 0.5 tablets by mouth every other day.      Follow-up Information    Dalia HeadingJENKINS,Finnigan Warriner A, MD. Schedule an appointment as soon as possible for a visit on 03/06/2016.   Specialty:  General Surgery Contact information: [address] [place] [id number] [phone]  SignedFranky Macho A 02/28/2016, 8:39 AM

## 2016-02-28 NOTE — Care Management Note (Signed)
Case Management Note  Patient Details  Name: Monica Stafford MRN: [id number] Date of Birth: 02/19/1941  Subjective/Objective:                  S/P lap appendectomy. Pt is from home, ind with ADL's. Family at bedside. She has PCP and f/u provider. She has insurance with drug coverage and transportation to appointments. She plans to return home with self care today.   Action/Plan: No CM needs.   Expected Discharge Date:     02/28/2016             Expected Discharge Plan:  Home/Self Care  In-House Referral:  NA  Discharge planning Services  CM Consult  Post Acute Care Choice:  NA Choice offered to:  NA  DME Arranged:    DME Agency:     HH Arranged:    HH Agency:     Status of Service:  Completed, signed off  If discussed at [employer] Length of Stay Meetings, dates discussed:    Additional Comments:  Malcolm MetroChildress, Mccabe Gloria Demske, RN 02/28/2016, 8:55 AM

## 2016-02-28 NOTE — Progress Notes (Signed)
Pt's IV catheter removed and intact. Pt's IV site clean dry and intact. Discharge instructions including medications and follow up appointments reviewed and discussed with patient. All questions were answered and no further questions at this time. Pt verbalized understanding of discharge instructions. Pt in stable condition and in no acute distress at time of discharge. Pt escorted by nurse tech.  

## 2016-02-28 NOTE — Care Management Obs Status (Signed)
MEDICARE OBSERVATION STATUS NOTIFICATION   Patient Details  Name: Monica Stafford MRN: [id number] Date of Birth: May 06, 1941   Medicare Observation Status Notification Given:  No (DC'd <24hrs)    Malcolm Metrohildress, Reinhold Rickey Demske, RN 02/28/2016, 8:53 AM

## 2016-02-29 LAB — URINE CULTURE

## 2016-03-01 ENCOUNTER — Encounter (HOSPITAL_COMMUNITY): Payer: Self-pay | Admitting: General Surgery

## 2016-03-06 ENCOUNTER — Telehealth: Payer: Self-pay | Admitting: *Deleted

## 2016-03-06 MED ORDER — ATORVASTATIN CALCIUM 80 MG PO TABS: 40.0000 mg | ORAL_TABLET | Freq: Every day | ORAL | 3 refills | Status: DC

## 2016-03-06 NOTE — Telephone Encounter (Signed)
-----   Message from Abelino DerrickLuke K Kilroy, [place]PA-C sent at 02/15/2016  7:27 PM EDT ----- Please let Ms Viviann Spareownsend know her lipids looked good- but her LFTs were slightly elelvated. Because her LDL is so low I think she can decrease her Lipitor to 40 mg. Have CMET and fasting lipids in 6 months.   Corine ShelterLUKE KILROY PA-C 02/15/2016 7:26 PM

## 2016-03-06 NOTE — Telephone Encounter (Signed)
Spoke with pt, aware of recommendations based on recent lab work. Patient voiced understanding to take 1/2 of the 60 mg lipitor she currently has. Discussed repeat labs, pt will discuss at follow up visit with dr branch.

## 2016-04-04 ENCOUNTER — Ambulatory Visit (INDEPENDENT_AMBULATORY_CARE_PROVIDER_SITE_OTHER): Payer: Medicare HMO | Admitting: Cardiology

## 2016-04-04 ENCOUNTER — Encounter: Payer: Self-pay | Admitting: Cardiology

## 2016-04-04 VITALS — BP 150/68 | HR 53 | Ht 68.0 in | Wt 172.0 lb

## 2016-04-04 DIAGNOSIS — I10 Essential (primary) hypertension: Secondary | ICD-10-CM | POA: Diagnosis not present

## 2016-04-04 DIAGNOSIS — I2510 Atherosclerotic heart disease of native coronary artery without angina pectoris: Secondary | ICD-10-CM

## 2016-04-04 DIAGNOSIS — R001 Bradycardia, unspecified: Secondary | ICD-10-CM

## 2016-04-04 DIAGNOSIS — I6523 Occlusion and stenosis of bilateral carotid arteries: Secondary | ICD-10-CM

## 2016-04-04 DIAGNOSIS — E782 Mixed hyperlipidemia: Secondary | ICD-10-CM | POA: Diagnosis not present

## 2016-04-04 NOTE — Progress Notes (Signed)
Clinical Summary [name] is a 75 y.o.female seen today as a new patient for the following medical problems  1. CAD - history of CABG in 2002 (LIMA-LAD,RIMA-RCA,SVG-OM1,SVG-D2). LVEF 69% by LVgram in 2002  - no recent chest pain. No SOB or DOE - compliant with meds  2. Carotid stenosis - history of left CEA 12/2000 - 02/2015 right 50-70% at right carotid bulb bifurcation, no significant disease on left.  - followed by vascular, appt next week.   3. Hyperlipidemia -01/2016: TC 107 TG 93 HDL 44 LDL 44 - compliant with statin. Recently decreased atorva to 40mg  daily due to mildly elevated LFTs.   4.HTN - home numbers 130-140s - compliant with meds  5. Sinus bradycardia - denies any lightheadness or dizziness.  - we previously decreased her lopressor to 25mg  bid  6. LE edema - started after norvasc was increased to 10mg  daily back in 10/2015 - last visit norvasc was decreased to 5mg  daily. Swelling has resolved.    Past Medical History:  Diagnosis Date  . Arthritis    back  all over  . Hyperlipidemia   . Hypertension   . Peripheral vascular disease (HCC)   . Pneumonia     50 years  ago  . Sleep apnea      study but dont wear   mask     Allergies  Allergen Reactions  . Sulfonamide Derivatives Itching     Current Outpatient Prescriptions  Medication Sig Dispense Refill  . alendronate (FOSAMAX) 70 MG tablet Take 70 mg by mouth every 7 (seven) days. Take with a full glass of water on an empty stomach. Usually on Monday    . amLODipine (NORVASC) 5 MG tablet Take 1 tablet (5 mg total) by mouth daily. 90 tablet 1  . aspirin EC 81 MG tablet Take 81 mg by mouth daily.    Marland Kitchen. atorvastatin (LIPITOR) 80 MG tablet Take 0.5 tablets (40 mg total) by mouth daily. 90 tablet 3  . Calcium Carbonate-Vitamin D (CALTRATE 600+D) 600-400 MG-UNIT per tablet Take 1 tablet by mouth 3 (three) times daily.     . cholecalciferol (VITAMIN D) 1000 UNITS tablet Take 1,000 Units by  mouth daily.    Marland Kitchen. estradiol (ESTRACE) 0.1 MG/GM vaginal cream Place 1 g vaginally once a week.     . fish oil-omega-3 fatty acids 1000 MG capsule Take 1 g by mouth daily.     . furosemide (LASIX) 40 MG tablet Take 20 mg by mouth daily.    Marland Kitchen. HYDROcodone-acetaminophen (NORCO) 5-325 MG tablet Take 1 tablet by mouth every 6 (six) hours as needed for moderate pain. 30 tablet 0  . ibuprofen (ADVIL,MOTRIN) 200 MG tablet Take 400 mg by mouth every 6 (six) hours as needed. For pain    . lisinopril (PRINIVIL,ZESTRIL) 40 MG tablet Take 40 mg by mouth daily.    . metoprolol tartrate (LOPRESSOR) 25 MG tablet Take 1 tablet (25 mg total) by mouth 2 (two) times daily. 180 tablet 3  . Multiple Vitamin (MULTIVITAMIN WITH MINERALS) TABS Take 1 tablet by mouth daily.    . potassium chloride (MICRO-K) 10 MEQ CR capsule Take 10 mEq by mouth 2 (two) times daily.    . raloxifene (EVISTA) 60 MG tablet Take 60 mg by mouth daily.    Marland Kitchen. triamterene-hydrochlorothiazide (MAXZIDE) 75-50 MG per tablet Take 0.5 tablets by mouth every other day.     No current facility-administered medications for this visit.  Past Surgical History:  Procedure Laterality Date  . ABDOMINAL HYSTERECTOMY  1998   [facility]  . CARDIAC CATHETERIZATION    . CATARACT EXTRACTION W/PHACO  12/10/2011   Procedure: CATARACT EXTRACTION PHACO AND INTRAOCULAR LENS PLACEMENT (IOC);  Surgeon: Gemma Payor, MD;  Location: [facility];  Service: Ophthalmology;  Laterality: Right;  CDE:10.40  . CATARACT EXTRACTION W/PHACO  01/07/2012   Procedure: CATARACT EXTRACTION PHACO AND INTRAOCULAR LENS PLACEMENT (IOC);  Surgeon: Gemma Payor, MD;  Location: [facility];  Service: Ophthalmology;  Laterality: Left;  CDE 8.38  . CORONARY ARTERY BYPASS GRAFT  12/23/2000   [facility]  . LAPAROSCOPIC APPENDECTOMY N/A 02/27/2016   Procedure: APPENDECTOMY LAPAROSCOPIC;  Surgeon: Franky Macho, MD;  Location: [facility];  Service: General;  Laterality: N/A;     Allergies    Allergen Reactions  . Sulfonamide Derivatives Itching      Family History  Problem Relation Age of Onset  . Heart disease Father     before age 18  . Heart attack Father      Social History Monica Stafford reports that she has never smoked. She has never used smokeless tobacco. Monica Stafford reports that she does not drink alcohol.   Review of Systems CONSTITUTIONAL: No weight loss, fever, chills, weakness or fatigue.  HEENT: Eyes: No visual loss, blurred vision, double vision or yellow sclerae.No hearing loss, sneezing, congestion, runny nose or sore throat.  SKIN: No rash or itching.  CARDIOVASCULAR: per HPI RESPIRATORY: No shortness of breath, cough or sputum.  GASTROINTESTINAL: No anorexia, nausea, vomiting or diarrhea. No abdominal pain or blood.  GENITOURINARY: No burning on urination, no polyuria NEUROLOGICAL: No headache, dizziness, syncope, paralysis, ataxia, numbness or tingling in the extremities. No change in bowel or bladder control.  MUSCULOSKELETAL: No muscle, back pain, joint pain or stiffness.  LYMPHATICS: No enlarged nodes. No history of splenectomy.  PSYCHIATRIC: No history of depression or anxiety.  ENDOCRINOLOGIC: No reports of sweating, cold or heat intolerance. No polyuria or polydipsia.  Marland Kitchen   Physical Examination Vitals:   04/04/16 0851  BP: (!) 150/68  Pulse: (!) 53   Vitals:   04/04/16 0851  Weight: 172 lb (78 kg)  Height: 5\' 8"  (1.727 m)    Gen: resting comfortably, no acute distress HEENT: no scleral icterus, pupils equal round and reactive, no palptable cervical adenopathy,  CV: RRR, no m/r/g, no jvd Resp: Clear to auscultation bilaterally GI: abdomen is soft, non-tender, non-distended, normal bowel sounds, no hepatosplenomegaly MSK: extremities are warm, no edema.  Skin: warm, no rash Neuro:  no focal deficits Psych: appropriate affect   Diagnostic Studies 12/2000 Cath PROCEDURES PERFORMED: 1. Left heart catheterization. 2.  Selective coronary arteriography. 3. Selective left ventriculography. 4. Subclavian angiography.  DESCRIPTION OF PROCEDURE: The procedure was performed from the right femoral artery using 6-French catheters. She tolerated the procedure well without complications. She did have elevated blood pressures. She was given two doses of labetalol 20 mg IV, plus an additional dose of 5 mg of intravenous metoprolol. There were no complications from the procedure and she was taken to the holding area in satisfactory clinical condition.  HEMODYNAMIC DATA: Central aortic pressure was 190/83. LV pressure was 190/20. There was no gradient on pullback across the aortic valve.  ANGIOGRAPHIC DATA: 1. Left ventriculography was performed in the RAO projection. Overall  ventricular systolic function was normal. No segmental abnormalities  or contraction were identified. There did not appear to be significant  mitral regurgitation. Ejection fraction  was calculated at 69%. 2. Subclavian angiography revealed a patent subclavian artery, as well as  an internal mammary artery. The internal mammary appeared to be suitable  for grafting. 3. The left main coronary artery was free of critical disease. 4. The left anterior descending artery coursed to the apex where it wrapped  the apical tip. The LAD had about a 50% area of narrowing beyond the  origin of the first diagonal. The first diagonal itself had about 50%  narrowing. Distal to this, there was a segmental area of plaquing leading  into an approximate 90% stenosis just after the large anterolateral Vail Vuncannon.  This anterolateral Caleb Decock itself was a large caliber vessel that had about  80% proximal narrowing. Both vessels appeared to be suitable for grafting. 5. The circumflex artery had mild plaquing of approximately 20% to 30% prior  to the first marginal. The first marginal itself had about 50% ostial and  then a  mid 60% stenosis. This vessel also appeared to be suitable for  grafting. 6. The right coronary artery was a large dominant vessel providing a moderate  sized posterior descending and a very large posterolateral Samanthamarie Ezzell. The  right coronary artery had a 20% to 30% narrowing in the proximal vessel.  In the mid vessel was a focal 90% stenosis.  CONCLUSIONS: 1. Preserved left ventricular function. 2. Patent internal mammary artery. 3. 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 4. Moderate stenosis of the circumflex artery. 5. High-grade stenosis of the mid right coronary artery that is suitable  for intervention.  Cath report 2002 recs: The patient has three-vessel disease. The right is suitable for intervention, but the LAD is a complex lesion with a high risk of target vessel revascularization characterized by a long lesion and small caliber vessel. Based on these risk factors and the multivessel disease, we likely will recommend revascularization surgery. I will notify her primary care physician and we will get a surgical consultation.DD: 12/20/00 TD: 12/20/00 Job: [id number]         Assessment and Plan  1. CAD - no current symptoms - she will continue current meds  2. Carotid stenosis - continue to follow with vacular  3. Hyperlipidemia - continue statin - request labs from pcp  4. HTN - above goal in clinic - she will submit bp log in 1 week. If above goal, consider changing maxide to chlorthalidone. Change lasix to just prn since on daily thiazide diuretic. Swelling on higerh dose norvasc. Would not tolerate higher beta blocker due to bradycardia.   5. Sinus bradycardia - no significant symptoms. Continue to monitor, if needed can stop beta blocker in the future. We will continue at this time given her CAD history   F/u 6 months      Antoine Poche, M.D., F.A.C.C.

## 2016-04-04 NOTE — Patient Instructions (Signed)
Medication Instructions:  Take lasix 20 mg daily AS NEEDED FOR SWELLING   Labwork: NONE  Testing/Procedures: NONE  Follow-Up: Your physician wants you to follow-up in: 6 MONTHS.  You will receive a reminder letter in the mail two months in advance. If you don't receive a letter, please call our office to schedule the follow-up appointment.   Any Other Special Instructions Will Be Listed Below (If Applicable). PLEASE KEEP A blood pressure log for 1 week    If you need a refill on your cardiac medications before your next appointment, please call your pharmacy.

## 2016-04-09 ENCOUNTER — Encounter: Payer: Self-pay | Admitting: Family

## 2016-04-10 ENCOUNTER — Ambulatory Visit (HOSPITAL_COMMUNITY)
Admission: RE | Admit: 2016-04-10 | Discharge: 2016-04-10 | Disposition: A | Discharge status: Home / Self Care | Payer: Medicare HMO | Source: Ambulatory Visit | Attending: Vascular Surgery | Admitting: Vascular Surgery

## 2016-04-10 ENCOUNTER — Encounter (HOSPITAL_COMMUNITY): Payer: Medicare Other

## 2016-04-10 ENCOUNTER — Encounter: Payer: Self-pay | Admitting: Family

## 2016-04-10 ENCOUNTER — Ambulatory Visit (INDEPENDENT_AMBULATORY_CARE_PROVIDER_SITE_OTHER): Payer: Medicare HMO | Admitting: Family

## 2016-04-10 ENCOUNTER — Ambulatory Visit: Payer: Medicare Other | Admitting: Family

## 2016-04-10 VITALS — BP 156/73 | HR 50 | Temp 97.9°F | Resp 16 | Ht 68.0 in | Wt 173.0 lb

## 2016-04-10 DIAGNOSIS — I6521 Occlusion and stenosis of right carotid artery: Secondary | ICD-10-CM | POA: Diagnosis not present

## 2016-04-10 DIAGNOSIS — Z9889 Other specified postprocedural states: Secondary | ICD-10-CM | POA: Diagnosis not present

## 2016-04-10 DIAGNOSIS — I6523 Occlusion and stenosis of bilateral carotid arteries: Secondary | ICD-10-CM | POA: Diagnosis not present

## 2016-04-10 LAB — VAS US CAROTID
LEFT ECA DIAS: -6 cm/s
Left CCA dist dias: -17 cm/s
Left CCA dist sys: -73 cm/s
Left CCA prox dias: 15 cm/s
Left CCA prox sys: 82 cm/s
Left ICA dist dias: -14 cm/s
Left ICA dist sys: -58 cm/s
Left ICA prox dias: -22 cm/s
Left ICA prox sys: -74 cm/s
RIGHT CCA MID DIAS: 10 cm/s
RIGHT ECA DIAS: -10 cm/s
Right CCA prox dias: -18 cm/s
Right CCA prox sys: -89 cm/s
Right cca dist sys: -46 cm/s

## 2016-04-10 NOTE — Patient Instructions (Signed)
Stroke Prevention Some medical conditions and behaviors are associated with an increased chance of having a stroke. You may prevent a stroke by making healthy choices and managing medical conditions. HOW CAN I REDUCE MY RISK OF HAVING A STROKE?   Stay physically active. Get at least 30 minutes of activity on most or all days.  Do not smoke. It may also be helpful to avoid exposure to secondhand smoke.  Limit alcohol use. Moderate alcohol use is considered to be:  No more than 2 drinks per day for men.  No more than 1 drink per day for nonpregnant women.  Eat healthy foods. This involves:  Eating 5 or more servings of fruits and vegetables a day.  Making dietary changes that address high blood pressure (hypertension), high cholesterol, diabetes, or obesity.  Manage your cholesterol levels.  Making food choices that are high in fiber and low in saturated fat, trans fat, and cholesterol may control cholesterol levels.  Take any prescribed medicines to control cholesterol as directed by your health care provider.  Manage your diabetes.  Controlling your carbohydrate and sugar intake is recommended to manage diabetes.  Take any prescribed medicines to control diabetes as directed by your health care provider.  Control your hypertension.  Making food choices that are low in salt (sodium), saturated fat, trans fat, and cholesterol is recommended to manage hypertension.  Ask your health care provider if you need treatment to lower your blood pressure. Take any prescribed medicines to control hypertension as directed by your health care provider.  If you are 18-39 years of age, have your blood pressure checked every 3-5 years. If you are 40 years of age or older, have your blood pressure checked every year.  Maintain a healthy weight.  Reducing calorie intake and making food choices that are low in sodium, saturated fat, trans fat, and cholesterol are recommended to manage  weight.  Stop drug abuse.  Avoid taking birth control pills.  Talk to your health care provider about the risks of taking birth control pills if you are over 35 years old, smoke, get migraines, or have ever had a blood clot.  Get evaluated for sleep disorders (sleep apnea).  Talk to your health care provider about getting a sleep evaluation if you snore a lot or have excessive sleepiness.  Take medicines only as directed by your health care provider.  For some people, aspirin or blood thinners (anticoagulants) are helpful in reducing the risk of forming abnormal blood clots that can lead to stroke. If you have the irregular heart rhythm of atrial fibrillation, you should be on a blood thinner unless there is a good reason you cannot take them.  Understand all your medicine instructions.  Make sure that other conditions (such as anemia or atherosclerosis) are addressed. SEEK IMMEDIATE MEDICAL CARE IF:   You have sudden weakness or numbness of the face, arm, or leg, especially on one side of the body.  Your face or eyelid droops to one side.  You have sudden confusion.  You have trouble speaking (aphasia) or understanding.  You have sudden trouble seeing in one or both eyes.  You have sudden trouble walking.  You have dizziness.  You have a loss of balance or coordination.  You have a sudden, severe headache with no known cause.  You have new chest pain or an irregular heartbeat. Any of these symptoms may represent a serious problem that is an emergency. Do not wait to see if the symptoms will   go away. Get medical help at once. Call your local emergency services (911 in [place] Do not drive yourself to the hospital.   This information is not intended to replace advice given to you by your health care provider. Make sure you discuss any questions you have with your health care provider.   Document Released: 07/12/2004 Document Revised: 06/25/2014 Document Reviewed:  12/05/2012 Elsevier Interactive Patient Education 2016 Elsevier Inc.  

## 2016-04-10 NOTE — Progress Notes (Signed)
Chief Complaint: Follow up Extracranial Carotid Artery Stenosis   History of Present Illness  Monica Stafford is a 75 y.o. female patient of Dr. Hart RochesterLawson who is s/p left carotid endarterectomy in 2002. She had a carotid ultrasound at [facility] which revealed a 50-70% right ICA stenosis and she was referred for further evaluation. She has not had regular follow-up since her original surgery.  She denies any known history of stroke or TIA. Specifically she denies a history of amaurosis fugax or monocular blindness, unilateral facial drooping, hemiplegia, or receptive or expressive aphasia.    She had an appendectomy in August 2017. Her cardiologist is Dr. Dina RichJonathan Branch.  She had a CABG in 2002.   She works 2 part time jobs. Her oldest son is in a nursing home due to being hit by a car, had a closed head injury.   Pt Diabetic: no Pt smoker: non-smoker  Pt meds include: Statin : yes ASA: yes Other anticoagulants/antiplatelets: no   Past Medical History:  Diagnosis Date  . Arthritis    back  all over  . Hyperlipidemia   . Hypertension   . Peripheral vascular disease (HCC)   . Pneumonia     50 years  ago  . Sleep apnea      study but dont wear   mask    Social History Social History  Substance Use Topics  . Smoking status: Never Smoker  . Smokeless tobacco: Never Used  . Alcohol use No    Family History Family History  Problem Relation Age of Onset  . Heart disease Father     before age 76  . Heart attack Father     Surgical History Past Surgical History:  Procedure Laterality Date  . ABDOMINAL HYSTERECTOMY  1998   [facility]  . CARDIAC CATHETERIZATION    . CATARACT EXTRACTION W/PHACO  12/10/2011   Procedure: CATARACT EXTRACTION PHACO AND INTRAOCULAR LENS PLACEMENT (IOC);  Surgeon: Gemma PayorKerry Hunt, MD;  Location: [facility];  Service: Ophthalmology;  Laterality: Right;  CDE:10.40  . CATARACT EXTRACTION W/PHACO  01/07/2012   Procedure:  CATARACT EXTRACTION PHACO AND INTRAOCULAR LENS PLACEMENT (IOC);  Surgeon: Gemma PayorKerry Hunt, MD;  Location: [facility];  Service: Ophthalmology;  Laterality: Left;  CDE 8.38  . CORONARY ARTERY BYPASS GRAFT  12/23/2000   [facility]  . LAPAROSCOPIC APPENDECTOMY N/A 02/27/2016   Procedure: APPENDECTOMY LAPAROSCOPIC;  Surgeon: Franky MachoMark Jenkins, MD;  Location: [facility];  Service: General;  Laterality: N/A;    Allergies  Allergen Reactions  . Sulfonamide Derivatives Itching    Current Outpatient Prescriptions  Medication Sig Dispense Refill  . alendronate (FOSAMAX) 70 MG tablet Take 70 mg by mouth every 7 (seven) days. Take with a full glass of water on an empty stomach. Usually on Monday    . amLODipine (NORVASC) 5 MG tablet Take 1 tablet (5 mg total) by mouth daily. 90 tablet 1  . aspirin EC 81 MG tablet Take 81 mg by mouth daily.    Marland Kitchen. atorvastatin (LIPITOR) 80 MG tablet Take 0.5 tablets (40 mg total) by mouth daily. 90 tablet 3  . Calcium Carbonate-Vitamin D (CALTRATE 600+D) 600-400 MG-UNIT per tablet Take 1 tablet by mouth 3 (three) times daily.     . cholecalciferol (VITAMIN D) 1000 UNITS tablet Take 5,000 Units by mouth daily.     Marland Kitchen. estradiol (ESTRACE) 0.1 MG/GM vaginal cream Place 1 g vaginally once a week.     . fish oil-omega-3 fatty  acids 1000 MG capsule Take 1 g by mouth daily.     . furosemide (LASIX) 40 MG tablet Take 20 mg by mouth daily. Take 20 mg daily as needed for swelling.    Marland Kitchen HYDROcodone-acetaminophen (NORCO) 5-325 MG tablet Take 1 tablet by mouth every 6 (six) hours as needed for moderate pain. 30 tablet 0  . ibuprofen (ADVIL,MOTRIN) 200 MG tablet Take 400 mg by mouth every 6 (six) hours as needed. For pain    . lisinopril (PRINIVIL,ZESTRIL) 40 MG tablet Take 40 mg by mouth daily.    . metoprolol tartrate (LOPRESSOR) 25 MG tablet Take 12.5 mg by mouth 2 (two) times daily.    . Multiple Vitamin (MULTIVITAMIN WITH MINERALS) TABS Take 1 tablet by mouth daily.    . potassium chloride  (MICRO-K) 10 MEQ CR capsule Take 10 mEq by mouth 2 (two) times daily.    . raloxifene (EVISTA) 60 MG tablet Take 60 mg by mouth daily.    Marland Kitchen triamterene-hydrochlorothiazide (MAXZIDE) 75-50 MG per tablet Take 0.5 tablets by mouth every other day.     No current facility-administered medications for this visit.     Review of Systems : See HPI for pertinent positives and negatives.  Physical Examination  Vitals:   04/10/16 1149 04/10/16 1152  BP: (!) 146/80 (!) 156/73  Pulse: (!) 50   Resp: 16   Temp: 97.9 F (36.6 C)   TempSrc: Oral   SpO2: 99%   Weight: 173 lb (78.5 kg)   Height: 5\' 8"  (1.727 m)    Body mass index is 26.3 kg/m.  General: WDWN female in NAD GAIT: normal Eyes: PERRLA Pulmonary:  Respirations are non-labored, good air movement, CTAB.   Cardiac: regular rhythm, no detected murmur.  VASCULAR EXAM Carotid Bruits Right Left   Negative Negative    Aorta is not palpable. Radial pulses are 2+ palpable and equal.                                                                                                                            LE Pulses Right Left       POPLITEAL  not palpable   not palpable       POSTERIOR TIBIAL   palpable    palpable        DORSALIS PEDIS      ANTERIOR TIBIAL  palpable   palpable     Gastrointestinal: soft, nontender, BS WNL, no r/g, no palpable masses.  Musculoskeletal: No muscle atrophy/wasting. M/S 5/5 throughout, extremities without ischemic changes.  Neurologic: A&O X 3; Appropriate Affect, Speech is normal CN 2-12 intact, pain and light touch intact in extremities, Motor exam as listed above.     Assessment: Monica Stafford is a 75 y.o. female who is s/p left carotid endarterectomy in 2002. She has no history of stroke or TIA.  DATA Today's carotid duplex suggests <40% bilateral ICA stenosis. Bilateral vertebral arteries are antegrade. Bilateral subclavian arteries  are triphasic. Less stenosis in the right  ICA compared to the duplex exam at [facility] in September 2016.   Plan: Follow-up in 1 year with Carotid Duplex scan.   I discussed in depth with the patient the nature of atherosclerosis, and emphasized the importance of maximal medical management including strict control of blood pressure, blood glucose, and lipid levels, obtaining regular exercise, and continued cessation of smoking.  The patient is aware that without maximal medical management the underlying atherosclerotic disease process will progress, limiting the benefit of any interventions. The patient was given information about stroke prevention and what symptoms should prompt the patient to seek immediate medical care. Thank you for allowing [place] to participate in this patient's care.  Charisse March, RN, MSN, FNP-C [facility] Office: [phone]  Clinic Physician: Early  04/10/16 12:23 PM

## 2016-04-16 ENCOUNTER — Other Ambulatory Visit (HOSPITAL_COMMUNITY): Payer: Self-pay | Admitting: Family Medicine

## 2016-04-16 DIAGNOSIS — Z1231 Encounter for screening mammogram for malignant neoplasm of breast: Secondary | ICD-10-CM

## 2016-04-27 ENCOUNTER — Ambulatory Visit (HOSPITAL_COMMUNITY)
Admission: RE | Admit: 2016-04-27 | Discharge: 2016-04-27 | Disposition: A | Discharge status: Home / Self Care | Payer: Medicare HMO | Source: Ambulatory Visit | Attending: Family Medicine | Admitting: Family Medicine

## 2016-04-27 DIAGNOSIS — Z1231 Encounter for screening mammogram for malignant neoplasm of breast: Secondary | ICD-10-CM

## 2016-05-08 NOTE — Addendum Note (Signed)
Addended by: Burton ApleyPETTY, Kaiya Boatman A on: 05/08/2016 01:44 PM   Modules accepted: Orders

## 2016-05-22 ENCOUNTER — Telehealth: Payer: Self-pay | Admitting: *Deleted

## 2016-05-22 NOTE — Telephone Encounter (Signed)
-----   Message from Antoine PocheJonathan F Branch, MD sent at 05/21/2016  1:06 PM EST ----- BP log looks good, no changes  Dominga FerryJ Branch MD

## 2016-05-22 NOTE — Telephone Encounter (Signed)
Patient notified and voiced understanding.

## 2016-09-18 ENCOUNTER — Encounter: Payer: Self-pay | Admitting: Cardiology

## 2016-09-18 ENCOUNTER — Telehealth: Payer: Self-pay | Admitting: Cardiology

## 2016-09-18 NOTE — Telephone Encounter (Signed)
Error

## 2016-10-15 ENCOUNTER — Ambulatory Visit: Payer: Medicare HMO | Admitting: Cardiology

## 2016-10-16 ENCOUNTER — Ambulatory Visit (INDEPENDENT_AMBULATORY_CARE_PROVIDER_SITE_OTHER): Payer: Medicare HMO | Admitting: Cardiology

## 2016-10-16 ENCOUNTER — Encounter: Payer: Self-pay | Admitting: Cardiology

## 2016-10-16 VITALS — BP 144/58 | HR 56 | Ht 68.0 in | Wt 168.0 lb

## 2016-10-16 DIAGNOSIS — R001 Bradycardia, unspecified: Secondary | ICD-10-CM | POA: Diagnosis not present

## 2016-10-16 DIAGNOSIS — I6523 Occlusion and stenosis of bilateral carotid arteries: Secondary | ICD-10-CM

## 2016-10-16 DIAGNOSIS — R600 Localized edema: Secondary | ICD-10-CM

## 2016-10-16 DIAGNOSIS — I2510 Atherosclerotic heart disease of native coronary artery without angina pectoris: Secondary | ICD-10-CM | POA: Diagnosis not present

## 2016-10-16 DIAGNOSIS — I10 Essential (primary) hypertension: Secondary | ICD-10-CM | POA: Diagnosis not present

## 2016-10-16 NOTE — Patient Instructions (Signed)
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
## 2016-10-29 ENCOUNTER — Encounter (HOSPITAL_COMMUNITY): Payer: Self-pay | Admitting: Emergency Medicine

## 2016-10-29 ENCOUNTER — Emergency Department (HOSPITAL_COMMUNITY)
Admission: EM | Admit: 2016-10-29 | Discharge: 2016-10-29 | Disposition: A | Discharge status: Home / Self Care | Payer: Medicare HMO | Attending: Emergency Medicine | Admitting: Emergency Medicine

## 2016-10-29 DIAGNOSIS — Z7982 Long term (current) use of aspirin: Secondary | ICD-10-CM | POA: Diagnosis not present

## 2016-10-29 DIAGNOSIS — I10 Essential (primary) hypertension: Secondary | ICD-10-CM | POA: Diagnosis not present

## 2016-10-29 DIAGNOSIS — Z79899 Other long term (current) drug therapy: Secondary | ICD-10-CM | POA: Insufficient documentation

## 2016-10-29 DIAGNOSIS — M5431 Sciatica, right side: Secondary | ICD-10-CM | POA: Diagnosis not present

## 2016-10-29 DIAGNOSIS — M79604 Pain in right leg: Secondary | ICD-10-CM | POA: Diagnosis present

## 2016-10-29 MED ORDER — OXYCODONE-ACETAMINOPHEN 5-325 MG PO TABS: 1.0000 | ORAL_TABLET | ORAL | 0 refills | Status: DC | PRN

## 2016-10-29 MED ORDER — ORPHENADRINE CITRATE ER 100 MG PO TB12: 100.0000 mg | ORAL_TABLET | Freq: Two times a day (BID) | ORAL | 0 refills | Status: DC

## 2016-10-29 MED ORDER — NAPROXEN 375 MG PO TABS: 375.0000 mg | ORAL_TABLET | Freq: Two times a day (BID) | ORAL | 0 refills | Status: DC

## 2016-10-29 MED ORDER — IBUPROFEN 400 MG PO TABS
600.0000 mg | ORAL_TABLET | Freq: Once | ORAL | Status: AC
  Administered 2016-10-29: 600 mg via ORAL
  Filled 2016-10-29: qty 2

## 2016-10-29 NOTE — ED Provider Notes (Signed)
[facility] Provider Note   CSN: [id number] Arrival date & time: 10/29/16  2143 By signing my name below, I, Levon Hedger, attest that this documentation has been prepared under the direction and in the presence of Dione Booze, MD . Electronically Signed: Levon Hedger, Scribe. 10/29/2016. 11:15 PM.   History   Chief Complaint Chief Complaint  Patient presents with  . Leg Pain   HPI Monica Stafford is a 76 y.o. female with a history of arthritis who presents to the Emergency Department complaining of intermittent, moderate outer right lateral leg pain that radiates to her foot onset one week ago. She describes this as 6/10, throbbing pain that is exacerbated by lying down or sitting, and unchanged by walking. Pt has taken Tylenol with no significant relief. She states she is unable to sleep due to pain.  She reports that she has been doing stepping exercises lately, but denies any injury or trauma. She takes baby aspirin daily, but denies the use of anticoagulants. Pt denies numbness, tingling, or weakness. Pt has no other acute complaints or associated symptoms at this time.   The history is provided by the patient. No language interpreter was used.    Past Medical History:  Diagnosis Date  . Arthritis    back  all over  . Hyperlipidemia   . Hypertension   . Peripheral vascular disease (HCC)   . Pneumonia     50 years  ago  . Sleep apnea      study but dont wear   mask    Patient Active Problem List   Diagnosis Date Noted  . Acute appendicitis 02/27/2016  . 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 Essential hypertension 01/17/2016  . Dyslipidemia 01/17/2016  . Edema of both legs 01/17/2016  . Carotid stenosis, asymptomatic 04/12/2015  . OBSTRUCTIVE SLEEP APNEA 09/10/2008    Past Surgical History:  Procedure Laterality Date  . ABDOMINAL HYSTERECTOMY  1998   [facility]  . CARDIAC CATHETERIZATION    . CATARACT EXTRACTION W/PHACO  12/10/2011   Procedure: CATARACT EXTRACTION PHACO AND INTRAOCULAR LENS PLACEMENT (IOC);  Surgeon: Gemma Payor, MD;  Location: [facility];  Service: Ophthalmology;  Laterality: Right;  CDE:10.40  . CATARACT EXTRACTION W/PHACO  01/07/2012   Procedure: CATARACT EXTRACTION PHACO AND INTRAOCULAR LENS PLACEMENT (IOC);  Surgeon: Gemma Payor, MD;  Location: [facility];  Service: Ophthalmology;  Laterality: Left;  CDE 8.38  . CORONARY ARTERY BYPASS GRAFT  12/23/2000   [facility]  . LAPAROSCOPIC APPENDECTOMY N/A 02/27/2016   Procedure: APPENDECTOMY LAPAROSCOPIC;  Surgeon: Franky Macho, MD;  Location: [facility];  Service: General;  Laterality: N/A;    OB History    No data available       Home Medications    Prior to Admission medications   Medication Sig Start Date End Date Taking? Authorizing Provider  acetaminophen (TYLENOL) 500 MG tablet Take 1,000 mg by mouth 3 (three) times daily as needed for mild pain or moderate pain.   Yes [provider]  alendronate (FOSAMAX) 70 MG tablet Take 70 mg by mouth every 7 (seven) days. Take with a full glass of water on an empty stomach. Usually on Monday   Yes [provider]  ALPRAZolam Prudy Feeler) 1 MG tablet Take 1 mg by mouth 3 (three) times daily. 10/18/16  Yes [provider]  amLODipine (NORVASC) 5 MG tablet Take 1 tablet (5 mg total) by mouth daily. 01/17/16  Yes Abelino Derrick, PA-C  aspirin EC 81 MG tablet Take 81 mg by mouth daily.   Yes [provider]  atorvastatin (LIPITOR) 80 MG tablet Take 0.5 tablets (40 mg total) by mouth daily. 03/06/16  Yes Kilroy, Eda Paschal, PA-C  Calcium Carbonate-Vitamin D (CALTRATE 600+D) 600-400 MG-UNIT per tablet Take 1 tablet by mouth 3 (three) times daily.    Yes [provider]  cholecalciferol (VITAMIN D) 1000 UNITS tablet Take 2,000-3,000 Units by mouth 2 (two) times daily. 3 CAPSULES IN THE MORNING AND 2 CAPSULES AT BEDTIME   Yes [provider]  estradiol (ESTRACE) 0.1 MG/GM vaginal cream Place  1 g vaginally 3 (three) times a week.    Yes [provider]  fish oil-omega-3 fatty acids 1000 MG capsule Take 1 g by mouth 2 (two) times daily.    Yes [provider]  furosemide (LASIX) 40 MG tablet Take 20 mg by mouth daily. Take 20 mg daily as needed for swelling.   Yes [provider]  lisinopril (PRINIVIL,ZESTRIL) 20 MG tablet Take 20 mg by mouth 2 (two) times daily. 10/29/16  Yes [provider]  metoprolol tartrate (LOPRESSOR) 25 MG tablet Take 12.5 mg by mouth 2 (two) times daily.   Yes [provider]  Multiple Vitamin (MULTIVITAMIN WITH MINERALS) TABS Take 1 tablet by mouth daily.   Yes [provider]  potassium chloride (MICRO-K) 10 MEQ CR capsule Take 10 mEq by mouth 2 (two) times daily.   Yes [provider]  raloxifene (EVISTA) 60 MG tablet Take 60 mg by mouth every other day.    Yes [provider]  triamterene-hydrochlorothiazide (MAXZIDE) 75-50 MG per tablet Take 0.5 tablets by mouth every other day.   Yes [provider]    Family History Family History  Problem Relation Age of Onset  . Heart disease Father        before age 77  . Heart attack Father   . Parkinson's disease Sister   . Colitis Sister     Social History Social History  Substance Use Topics  . Smoking status: Never Smoker  . Smokeless tobacco: Never Used  . Alcohol use No     Allergies   Sulfonamide derivatives   Review of Systems Review of Systems  Musculoskeletal: Positive for myalgias.  Neurological: Negative for weakness and numbness.  Hematological: Does not bruise/bleed easily.  All other systems reviewed and are negative.   Physical Exam Updated Vital Signs BP (!) 159/67 (BP Location: Right Arm)   Pulse (!) 58   Temp 97.7 F (36.5 C) (Oral)   Resp 20   Ht 5\' 8"  (1.727 m)   Wt 171 lb (77.6 kg)   SpO2 96%   BMI 26.00 kg/m   Physical Exam  Constitutional: She is oriented to person, place, and  time. She appears well-developed and well-nourished.  HENT:  Head: Normocephalic and atraumatic.  Eyes: EOM are normal. Pupils are equal, round, and reactive to light.  Neck: Normal range of motion. Neck supple. No JVD present.  Cardiovascular: Normal rate, regular rhythm and normal heart sounds.   No murmur heard. Pulmonary/Chest: Effort normal and breath sounds normal. She has no wheezes. She has no rales. She exhibits no tenderness.  Abdominal: Soft. Bowel sounds are normal. She exhibits no distension and no mass. There is no tenderness.  Musculoskeletal: Normal range of motion. She exhibits no edema.  Mild right para lumbar spasm. Negative straight leg raise. Trace pre-tibial edema. Mild venus stasis changes.  Lymphadenopathy:    She has no cervical adenopathy.  Neurological: She is alert and oriented to person, place, and time. No cranial nerve deficit. She exhibits normal muscle tone. Coordination normal.  Skin: Skin is warm and dry. No rash noted.  Psychiatric: She has a normal mood and affect. Her behavior is normal. Judgment and thought content normal.  Nursing note and vitals reviewed.   ED Treatments / Results  DIAGNOSTIC STUDIES:  Oxygen Saturation is 96% on RA, normal by my interpretation.    COORDINATION OF CARE:  11:14 PM Discussed treatment plan which includes muscle relaxers and oxycodone with pt at bedside and pt agreed to plan.    Procedures Procedures (including critical care time)  Medications Ordered in ED Medications - No data to display   Initial Impression / Assessment and Plan / ED Course  I have reviewed the triage vital signs and the nursing notes.   Left leg pain in pattern strongly suggestive of sciatica. Old records are reviewed, and she had a CT of abdomen and pelvis in September 2017 which did show significant degenerative changes in the lumbar spine. No evidence of any neurologic injury. No red flags to suggest more serious pathology. She is  discharged with prescriptions for naproxen, orphenadrine, and oxycodone-acetaminophen. Recommended she use ice to her lumbar area. Follow-up with PCP.  Final Clinical Impressions(s) / ED Diagnoses   Final diagnoses:  Sciatica of right side    New Prescriptions New Prescriptions   NAPROXEN (NAPROSYN) 375 MG TABLET    Take 1 tablet (375 mg total) by mouth 2 (two) times daily.   ORPHENADRINE (NORFLEX) 100 MG TABLET    Take 1 tablet (100 mg total) by mouth 2 (two) times daily.   OXYCODONE-ACETAMINOPHEN (PERCOCET) 5-325 MG TABLET    Take 1 tablet by mouth every 4 (four) hours as needed for moderate pain.   OXYCODONE-ACETAMINOPHEN (PERCOCET) 5-325 MG TABLET    Take 1 tablet by mouth every 4 (four) hours as needed for moderate pain.   I personally performed the services described in this documentation, which was scribed in my presence. The recorded information has been reviewed and is accurate.       Dione BoozeGlick, Isreal Moline, MD 10/29/16 2329

## 2016-10-29 NOTE — ED Triage Notes (Signed)
Pt c/o right leg pain from hip to foot x one week and denies any injury.

## 2016-10-29 NOTE — Discharge Instructions (Signed)
Apply ice to the right side of your lower back several times a day.

## 2016-11-05 MED FILL — Oxycodone w/ Acetaminophen Tab 5-325 MG: ORAL | Qty: 6 | Status: AC

## 2017-01-15 ENCOUNTER — Telehealth: Payer: Self-pay | Admitting: *Deleted

## 2017-01-15 NOTE — Telephone Encounter (Signed)
Pt requested refill from [facility] of Metoprolol Tartrate 50 mg BID. Rx date is 08/09/15 by Dr. Conley RollsKnowelton. Last filled on 08/01/16 with a 90 day supply. According to our records Pt is on Metoprolol Tartrate 12.5 mg BID. [facility] does not show that this Rx was filled. Spoke with pt who states that she is currently taking 50 mg 1/2 Tablet BID. Please advise.

## 2017-01-16 MED ORDER — METOPROLOL TARTRATE 25 MG PO TABS: 25.0000 mg | ORAL_TABLET | Freq: Two times a day (BID) | ORAL | 3 refills | Status: DC

## 2017-01-16 NOTE — Addendum Note (Signed)
Addended by: Kerney ElbePINNIX, Hillary Schwegler G on: 01/16/2017 02:22 PM   Modules accepted: Orders

## 2017-01-16 NOTE — Telephone Encounter (Signed)
If she has done ok with lorpessor 25mg  bid ok to continue, please update her chart  Monica FerryJ Monica Heeg MD

## 2017-01-16 NOTE — Telephone Encounter (Signed)
Refill complete 

## 2017-02-25 ENCOUNTER — Other Ambulatory Visit: Payer: Self-pay | Admitting: Cardiology

## 2017-03-05 ENCOUNTER — Other Ambulatory Visit: Payer: Self-pay | Admitting: Cardiology

## 2017-03-08 ENCOUNTER — Telehealth: Payer: Self-pay | Admitting: Adult Health

## 2017-03-08 MED ORDER — AMLODIPINE BESYLATE 5 MG PO TABS: 5.0000 mg | ORAL_TABLET | Freq: Every day | ORAL | 3 refills | Status: DC

## 2017-03-08 NOTE — Telephone Encounter (Signed)
Refill on Amlodopine / tg

## 2017-03-18 ENCOUNTER — Other Ambulatory Visit (HOSPITAL_COMMUNITY): Payer: Self-pay | Admitting: Family Medicine

## 2017-03-18 ENCOUNTER — Ambulatory Visit (HOSPITAL_COMMUNITY)
Admission: RE | Admit: 2017-03-18 | Discharge: 2017-03-18 | Disposition: A | Discharge status: Home / Self Care | Payer: Medicare HMO | Source: Ambulatory Visit | Attending: Family Medicine | Admitting: Family Medicine

## 2017-03-18 DIAGNOSIS — M25551 Pain in right hip: Secondary | ICD-10-CM | POA: Insufficient documentation

## 2017-03-18 DIAGNOSIS — R52 Pain, unspecified: Secondary | ICD-10-CM

## 2017-03-18 DIAGNOSIS — M4186 Other forms of scoliosis, lumbar region: Secondary | ICD-10-CM | POA: Insufficient documentation

## 2017-03-18 DIAGNOSIS — I700 Atherosclerosis of aorta: Secondary | ICD-10-CM | POA: Insufficient documentation

## 2017-03-18 DIAGNOSIS — M160 Bilateral primary osteoarthritis of hip: Secondary | ICD-10-CM | POA: Insufficient documentation

## 2017-03-18 DIAGNOSIS — M25552 Pain in left hip: Secondary | ICD-10-CM | POA: Insufficient documentation

## 2017-03-19 ENCOUNTER — Observation Stay (HOSPITAL_COMMUNITY)
Admission: EM | Admit: 2017-03-19 | Discharge: 2017-03-20 | Disposition: A | Discharge status: Home / Self Care | Payer: Medicare HMO | Attending: Cardiology | Admitting: Cardiology

## 2017-03-19 ENCOUNTER — Emergency Department (HOSPITAL_COMMUNITY): Payer: Medicare HMO

## 2017-03-19 ENCOUNTER — Encounter (HOSPITAL_COMMUNITY): Payer: Self-pay

## 2017-03-19 ENCOUNTER — Other Ambulatory Visit (HOSPITAL_COMMUNITY): Payer: Self-pay | Admitting: Family Medicine

## 2017-03-19 ENCOUNTER — Other Ambulatory Visit: Payer: Self-pay | Admitting: Family Medicine

## 2017-03-19 DIAGNOSIS — Z9841 Cataract extraction status, right eye: Secondary | ICD-10-CM | POA: Insufficient documentation

## 2017-03-19 DIAGNOSIS — M1610 Unilateral primary osteoarthritis, unspecified hip: Secondary | ICD-10-CM | POA: Diagnosis not present

## 2017-03-19 DIAGNOSIS — I700 Atherosclerosis of aorta: Secondary | ICD-10-CM | POA: Insufficient documentation

## 2017-03-19 DIAGNOSIS — R600 Localized edema: Secondary | ICD-10-CM | POA: Diagnosis not present

## 2017-03-19 DIAGNOSIS — Z951 Presence of aortocoronary bypass graft: Secondary | ICD-10-CM

## 2017-03-19 DIAGNOSIS — R61 Generalized hyperhidrosis: Secondary | ICD-10-CM | POA: Insufficient documentation

## 2017-03-19 DIAGNOSIS — Z90710 Acquired absence of both cervix and uterus: Secondary | ICD-10-CM | POA: Insufficient documentation

## 2017-03-19 DIAGNOSIS — I200 Unstable angina: Secondary | ICD-10-CM | POA: Diagnosis present

## 2017-03-19 DIAGNOSIS — M1990 Unspecified osteoarthritis, unspecified site: Secondary | ICD-10-CM | POA: Insufficient documentation

## 2017-03-19 DIAGNOSIS — Z7982 Long term (current) use of aspirin: Secondary | ICD-10-CM | POA: Diagnosis not present

## 2017-03-19 DIAGNOSIS — Z882 Allergy status to sulfonamides status: Secondary | ICD-10-CM | POA: Diagnosis not present

## 2017-03-19 DIAGNOSIS — E785 Hyperlipidemia, unspecified: Secondary | ICD-10-CM | POA: Diagnosis present

## 2017-03-19 DIAGNOSIS — M419 Scoliosis, unspecified: Secondary | ICD-10-CM | POA: Insufficient documentation

## 2017-03-19 DIAGNOSIS — Z8379 Family history of other diseases of the digestive system: Secondary | ICD-10-CM | POA: Diagnosis not present

## 2017-03-19 DIAGNOSIS — I10 Essential (primary) hypertension: Secondary | ICD-10-CM | POA: Diagnosis present

## 2017-03-19 DIAGNOSIS — Z820 Family history of epilepsy and other diseases of the nervous system: Secondary | ICD-10-CM | POA: Diagnosis not present

## 2017-03-19 DIAGNOSIS — G4733 Obstructive sleep apnea (adult) (pediatric): Secondary | ICD-10-CM | POA: Diagnosis not present

## 2017-03-19 DIAGNOSIS — Z8249 Family history of ischemic heart disease and other diseases of the circulatory system: Secondary | ICD-10-CM | POA: Insufficient documentation

## 2017-03-19 DIAGNOSIS — R079 Chest pain, unspecified: Secondary | ICD-10-CM | POA: Diagnosis present

## 2017-03-19 DIAGNOSIS — R55 Syncope and collapse: Secondary | ICD-10-CM

## 2017-03-19 DIAGNOSIS — Z79899 Other long term (current) drug therapy: Secondary | ICD-10-CM | POA: Diagnosis not present

## 2017-03-19 DIAGNOSIS — Z9842 Cataract extraction status, left eye: Secondary | ICD-10-CM | POA: Diagnosis not present

## 2017-03-19 DIAGNOSIS — I739 Peripheral vascular disease, unspecified: Secondary | ICD-10-CM | POA: Insufficient documentation

## 2017-03-19 DIAGNOSIS — I6529 Occlusion and stenosis of unspecified carotid artery: Secondary | ICD-10-CM | POA: Diagnosis not present

## 2017-03-19 DIAGNOSIS — M5441 Lumbago with sciatica, right side: Secondary | ICD-10-CM

## 2017-03-19 LAB — CBC WITH DIFFERENTIAL/PLATELET
Basophils Absolute: 0.1 10*3/uL (ref 0.0–0.1)
Basophils Relative: 1 %
Eosinophils Absolute: 0.2 10*3/uL (ref 0.0–0.7)
Eosinophils Relative: 2 %
HCT: 41.2 % (ref 36.0–46.0)
Hemoglobin: 13.9 g/dL (ref 12.0–15.0)
Lymphocytes Relative: 29 %
Lymphs Abs: 2.5 10*3/uL (ref 0.7–4.0)
MCH: 34.3 pg — ABNORMAL HIGH (ref 26.0–34.0)
MCHC: 33.7 g/dL (ref 30.0–36.0)
MCV: 101.7 fL — ABNORMAL HIGH (ref 78.0–100.0)
Monocytes Absolute: 0.9 10*3/uL (ref 0.1–1.0)
Monocytes Relative: 11 %
Neutro Abs: 5 10*3/uL (ref 1.7–7.7)
Neutrophils Relative %: 57 %
Platelets: 183 10*3/uL (ref 150–400)
RBC: 4.05 MIL/uL (ref 3.87–5.11)
RDW: 12.9 % (ref 11.5–15.5)
WBC: 8.6 10*3/uL (ref 4.0–10.5)

## 2017-03-19 LAB — BASIC METABOLIC PANEL
Anion gap: 8 (ref 5–15)
BUN: 27 mg/dL — ABNORMAL HIGH (ref 6–20)
CO2: 33 mmol/L — ABNORMAL HIGH (ref 22–32)
Calcium: 9.8 mg/dL (ref 8.9–10.3)
Chloride: 93 mmol/L — ABNORMAL LOW (ref 101–111)
Creatinine, Ser: 1.64 mg/dL — ABNORMAL HIGH (ref 0.44–1.00)
GFR calc Af Amer: 34 mL/min — ABNORMAL LOW (ref >60)
GFR calc non Af Amer: 29 mL/min — ABNORMAL LOW (ref >60)
Glucose, Bld: 181 mg/dL — ABNORMAL HIGH (ref 65–99)
Potassium: 3.1 mmol/L — ABNORMAL LOW (ref 3.5–5.1)
Sodium: 134 mmol/L — ABNORMAL LOW (ref 135–145)

## 2017-03-19 LAB — I-STAT TROPONIN, ED: Troponin i, poc: 0 ng/mL (ref 0.00–0.08)

## 2017-03-19 LAB — PROTIME-INR
INR: 1.04
Prothrombin Time: 13.5 seconds (ref 11.4–15.2)

## 2017-03-19 MED ORDER — HEPARIN BOLUS VIA INFUSION
4000.0000 [IU] | Freq: Once | INTRAVENOUS | Status: AC
  Administered 2017-03-19: 4000 [IU] via INTRAVENOUS

## 2017-03-19 MED ORDER — ASPIRIN 81 MG PO CHEW
243.0000 mg | CHEWABLE_TABLET | Freq: Once | ORAL | Status: AC
  Administered 2017-03-19: 243 mg via ORAL
  Filled 2017-03-19: qty 3

## 2017-03-19 MED ORDER — HEPARIN (PORCINE) IN NACL 100-0.45 UNIT/ML-% IJ SOLN
900.0000 [IU]/h | INTRAMUSCULAR | Status: DC
  Administered 2017-03-19: 900 [IU]/h via INTRAVENOUS
  Filled 2017-03-19 (×2): qty 250

## 2017-03-19 MED ORDER — POTASSIUM CHLORIDE CRYS ER 20 MEQ PO TBCR
40.0000 meq | EXTENDED_RELEASE_TABLET | Freq: Once | ORAL | Status: AC
  Administered 2017-03-19: 40 meq via ORAL
  Filled 2017-03-19: qty 2

## 2017-03-19 MED ORDER — ASPIRIN 81 MG PO CHEW: 243.0000 mg | CHEWABLE_TABLET | Freq: Once | ORAL | Status: DC

## 2017-03-19 NOTE — ED Provider Notes (Addendum)
[facility] Provider Note   CSN: [id number] Arrival date & time: 03/19/17  1835     History   Chief Complaint Chief Complaint  Patient presents with  . Chest Pain    HPI Monica Stafford is a 76 y.o. female.complains of anterior chest pain onset possibly one hour prior to coming here tonightdescribed as pressure. Nonradiating. She treated self with one sublingual nitroglycerin prior to coming here and immediately had syncopal event after taking afternitro glycerin. She is presently asymptomatic.no other associated symptoms. Nothing made symptoms better or worse.  HPI  Past Medical History:  Diagnosis Date  . Arthritis    back  all over  . Hyperlipidemia   . Hypertension   . Peripheral vascular disease (HCC)   . Pneumonia     50 years  ago  . Sleep apnea      study but dont wear   mask    Patient Active Problem List   Diagnosis Date Noted  . Acute appendicitis 02/27/2016  . 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 Essential hypertension 01/17/2016  . Dyslipidemia 01/17/2016  . Edema of both legs 01/17/2016  . Carotid stenosis, asymptomatic 04/12/2015  . OBSTRUCTIVE SLEEP APNEA 09/10/2008    Past Surgical History:  Procedure Laterality Date  . ABDOMINAL HYSTERECTOMY  1998   [facility]  . CARDIAC CATHETERIZATION    . CATARACT EXTRACTION W/PHACO  12/10/2011   Procedure: CATARACT EXTRACTION PHACO AND INTRAOCULAR LENS PLACEMENT (IOC);  Surgeon: Gemma Payor, MD;  Location: [facility];  Service: Ophthalmology;  Laterality: Right;  CDE:10.40  . CATARACT EXTRACTION W/PHACO  01/07/2012   Procedure: CATARACT EXTRACTION PHACO AND INTRAOCULAR LENS PLACEMENT (IOC);  Surgeon: Gemma Payor, MD;  Location: [facility];  Service: Ophthalmology;  Laterality: Left;  CDE 8.38  . CORONARY ARTERY BYPASS GRAFT  12/23/2000   [facility]  . LAPAROSCOPIC APPENDECTOMY N/A 02/27/2016   Procedure: APPENDECTOMY LAPAROSCOPIC;  Surgeon: Franky Macho, MD;  Location: [facility];  Service: General;   Laterality: N/A;    OB History    No data available       Home Medications    Prior to Admission medications   Medication Sig Start Date End Date Taking? Authorizing Provider  acetaminophen (TYLENOL) 500 MG tablet Take 1,000 mg by mouth 3 (three) times daily as needed for mild pain or moderate pain.    [provider]  alendronate (FOSAMAX) 70 MG tablet Take 70 mg by mouth every 7 (seven) days. Take with a full glass of water on an empty stomach. Usually on Monday    [provider]  ALPRAZolam Prudy Feeler) 1 MG tablet Take 1 mg by mouth 3 (three) times daily. 10/18/16   [provider]  amLODipine (NORVASC) 5 MG tablet Take 1 tablet (5 mg total) by mouth daily. 03/08/17   Antoine Poche, MD  aspirin EC 81 MG tablet Take 81 mg by mouth daily.    [provider]  atorvastatin (LIPITOR) 80 MG tablet Take 0.5 tablets (40 mg total) by mouth daily. 03/06/16   Abelino Derrick, PA-C  Calcium Carbonate-Vitamin D (CALTRATE 600+D) 600-400 MG-UNIT per tablet Take 1 tablet by mouth 3 (three) times daily.     [provider]  cholecalciferol (VITAMIN D) 1000 UNITS tablet Take 2,000-3,000 Units by mouth 2 (two) times daily. 3 CAPSULES IN THE MORNING AND 2 CAPSULES AT BEDTIME    [provider]  estradiol (ESTRACE) 0.1 MG/GM vaginal cream Place 1 g  vaginally 3 (three) times a week.     [provider]  fish oil-omega-3 fatty acids 1000 MG capsule Take 1 g by mouth 2 (two) times daily.     [provider]  furosemide (LASIX) 40 MG tablet Take 20 mg by mouth daily. Take 20 mg daily as needed for swelling.    [provider]  lisinopril (PRINIVIL,ZESTRIL) 20 MG tablet Take 20 mg by mouth 2 (two) times daily. 10/29/16   [provider]  metoprolol tartrate (LOPRESSOR) 25 MG tablet Take 1 tablet (25 mg total) by mouth 2 (two) times daily. 01/16/17 04/16/17  Antoine Poche, MD  Multiple Vitamin (MULTIVITAMIN WITH MINERALS)  TABS Take 1 tablet by mouth daily.    [provider]  naproxen (NAPROSYN) 375 MG tablet Take 1 tablet (375 mg total) by mouth 2 (two) times daily. 10/29/16   Dione Booze, MD  orphenadrine (NORFLEX) 100 MG tablet Take 1 tablet (100 mg total) by mouth 2 (two) times daily. 10/29/16   Dione Booze, MD  oxyCODONE-acetaminophen (PERCOCET) 5-325 MG tablet Take 1 tablet by mouth every 4 (four) hours as needed for moderate pain. 10/29/16   Dione Booze, MD  oxyCODONE-acetaminophen (PERCOCET) 5-325 MG tablet Take 1 tablet by mouth every 4 (four) hours as needed for moderate pain. 10/29/16   Dione Booze, MD  potassium chloride (MICRO-K) 10 MEQ CR capsule Take 10 mEq by mouth 2 (two) times daily.    [provider]  raloxifene (EVISTA) 60 MG tablet Take 60 mg by mouth every other day.     [provider]  triamterene-hydrochlorothiazide (MAXZIDE) 75-50 MG per tablet Take 0.5 tablets by mouth every other day.    [provider]    Family History Family History  Problem Relation Age of Onset  . Heart disease Father        before age 50  . Heart attack Father   . Parkinson's disease Sister   . Colitis Sister     Social History Social History  Substance Use Topics  . Smoking status: Never Smoker  . Smokeless tobacco: Never Used  . Alcohol use No     Allergies   Sulfonamide derivatives   Review of Systems Review of Systems  Constitutional: Negative.   HENT: Negative.   Respiratory: Negative.   Cardiovascular: Positive for chest pain.       Syncope  Gastrointestinal: Negative.   Musculoskeletal: Negative.   Skin: Negative.   Neurological: Negative.   Psychiatric/Behavioral: Negative.   All other systems reviewed and are negative.    Physical Exam Updated Vital Signs BP (!) 126/56 (BP Location: Left Arm)   Pulse (!) 59   Temp 97.9 F (36.6 C) (Oral)   Resp 18   Ht  (1.727 m)   Wt 74.8 kg (165 lb)   SpO2 97%   BMI 25.09 kg/m   Physical  Exam  Constitutional: She appears well-developed and well-nourished.  HENT:  Head: Normocephalic and atraumatic.  Eyes: Pupils are equal, round, and reactive to light. Conjunctivae are normal.  Neck: Neck supple. No tracheal deviation present. No thyromegaly present.  Cardiovascular: Normal rate and regular rhythm.   No murmur heard. Pulmonary/Chest: Effort normal and breath sounds normal.  Abdominal: Soft. Bowel sounds are normal. She exhibits no distension. There is no tenderness.  Musculoskeletal: Normal range of motion. She exhibits no edema or tenderness.  Neurological: She is alert. Coordination normal.  Skin: Skin is warm and dry. No rash noted.  Psychiatric: She has a normal mood and affect.  Nursing note and vitals reviewed.    ED Treatments / Results  Labs (all labs ordered are listed, but only abnormal results are displayed) Labs Reviewed  CBC WITH DIFFERENTIAL/PLATELET  BASIC METABOLIC PANEL  I-STAT TROPONIN, ED    EKG  EKG Interpretation  Date/Time:  Tuesday March 19 2017 18:47:29 EDT Ventricular Rate:  55 PR Interval:    QRS Duration: 101 QT Interval:  427 QTC Calculation: 409 R Axis:   73 Text Interpretation:  Sinus rhythm ST elevation in Inferior leads New since previous tracing Confirmed by Doug Sou [id number]) on 03/19/2017 7:15:56 PM       Radiology Dg Lumbar Spine Complete  Result Date: 03/18/2017 CLINICAL DATA:  76 year old female with right hip pain for 5 months. No known injury. Initial encounter. EXAM: LUMBAR SPINE - COMPLETE 4+ VIEW COMPARISON:  02/27/2016 CT FINDINGS: Scoliosis lumbar spine convex left. No pars defect.  Facet degenerative changes L3-4 through L5-S1. Minimal anterior slip/rotation L3 and L4. Very mild L5-S1 disc space narrowing. 3.4 x 2.8 mm lucency with well-defined sclerotic borders left ilium similar to prior CT suggestive of a benign process. Vascular calcifications. IMPRESSION: Scoliosis lumbar spine convex left. Facet  degenerative changes L3-4 through L5-S1. Minimal anterior slip/rotation L3 and L4. Very mild L5-S1 disc space narrowing. 3.4 x 2.8 mm lucency with well-defined sclerotic borders left ilium similar to prior CT suggestive of a benign process. Aortic Atherosclerosis (ICD10-I70.0). Electronically Signed   By: Lacy Duverney M.D.   On: 03/18/2017 18:33   Dg Hip Unilat With Pelvis Min 4 Views Right  Result Date: 03/18/2017 CLINICAL DATA:  76 year old female with right hip pain for 5 months. No known injury. Initial encounter. EXAM: DG HIP (WITH OR WITHOUT PELVIS) 4+V RIGHT COMPARISON:  02/27/2016 CT. FINDINGS: Moderate right side and mild left sided hip joint degenerative changes. No plain film evidence of avascular necrosis. Well-defined lucency with sclerotic border left iliac crest similar to prior CT suggestive of benign process. Vascular calcifications. IMPRESSION: Moderate right-sided and mild left-sided hip joint degenerative changes. Electronically Signed   By: Lacy Duverney M.D.   On: 03/18/2017 18:27    Procedures Procedures (including critical care time)  Medications Ordered in ED Medications  aspirin chewable tablet 243 mg (243 mg Oral Given 03/19/17 1903)   Chest x-ray viewed by me Results for orders placed or performed during the hospital encounter of 03/19/17  CBC with Differential  Result Value Ref Range   WBC 8.6 4.0 - 10.5 K/uL   RBC 4.05 3.87 - 5.11 MIL/uL   Hemoglobin 13.9 12.0 - 15.0 g/dL   HCT 62.1 30.8 - 65.7 %   MCV 101.7 (H) 78.0 - 100.0 fL   MCH 34.3 (H) 26.0 - 34.0 pg   MCHC 33.7 30.0 - 36.0 g/dL   RDW 84.6 96.2 - 95.2 %   Platelets 183 150 - 400 K/uL   Neutrophils Relative % 57 %   Neutro Abs 5.0 1.7 - 7.7 K/uL   Lymphocytes Relative 29 %   Lymphs Abs 2.5 0.7 - 4.0 K/uL   Monocytes Relative 11 %   Monocytes Absolute 0.9 0.1 - 1.0 K/uL   Eosinophils Relative 2 %   Eosinophils Absolute 0.2 0.0 - 0.7 K/uL   Basophils Relative 1 %   Basophils Absolute 0.1 0.0 - 0.1  K/uL  Basic metabolic panel  Result Value Ref Range   Sodium 134 (L) 135 - 145 mmol/L   Potassium 3.1 (  L) 3.5 - 5.1 mmol/L   Chloride 93 (L) 101 - 111 mmol/L   CO2 33 (H) 22 - 32 mmol/L   Glucose, Bld 181 (H) 65 - 99 mg/dL   BUN 27 (H) 6 - 20 mg/dL   Creatinine, Ser 7.82 (H) 0.44 - 1.00 mg/dL   Calcium 9.8 8.9 - 95.6 mg/dL   GFR calc non Af Amer 29 (L) >60 mL/min   GFR calc Af Amer 34 (L) >60 mL/min   Anion gap 8 5 - 15  I-Stat Troponin, ED (not at [facility])  Result Value Ref Range   Troponin i, poc 0.00 0.00 - 0.08 ng/mL   Comment 3           Dg Lumbar Spine Complete  Result Date: 03/18/2017 CLINICAL DATA:  76 year old female with right hip pain for 5 months. No known injury. Initial encounter. EXAM: LUMBAR SPINE - COMPLETE 4+ VIEW COMPARISON:  02/27/2016 CT FINDINGS: Scoliosis lumbar spine convex left. No pars defect.  Facet degenerative changes L3-4 through L5-S1. Minimal anterior slip/rotation L3 and L4. Very mild L5-S1 disc space narrowing. 3.4 x 2.8 mm lucency with well-defined sclerotic borders left ilium similar to prior CT suggestive of a benign process. Vascular calcifications. IMPRESSION: Scoliosis lumbar spine convex left. Facet degenerative changes L3-4 through L5-S1. Minimal anterior slip/rotation L3 and L4. Very mild L5-S1 disc space narrowing. 3.4 x 2.8 mm lucency with well-defined sclerotic borders left ilium similar to prior CT suggestive of a benign process. Aortic Atherosclerosis (ICD10-I70.0). Electronically Signed   By: Lacy Duverney M.D.   On: 03/18/2017 18:33   Dg Chest Port 1 View  Result Date: 03/19/2017 CLINICAL DATA:  Central chest pain.  Syncope. EXAM: PORTABLE CHEST 1 VIEW COMPARISON:  February 27, 2016 FINDINGS: The patient is status post CABG. The cardiomediastinal silhouette is stable. No pneumothorax. No pulmonary nodules or masses. No focal infiltrates. No acute abnormalities. IMPRESSION: No active disease. Electronically Signed   By: Gerome Esias Mory III M.D    On: 03/19/2017 19:36   Dg Hip Unilat With Pelvis Min 4 Views Right  Result Date: 03/18/2017 CLINICAL DATA:  76 year old female with right hip pain for 5 months. No known injury. Initial encounter. EXAM: DG HIP (WITH OR WITHOUT PELVIS) 4+V RIGHT COMPARISON:  02/27/2016 CT. FINDINGS: Moderate right side and mild left sided hip joint degenerative changes. No plain film evidence of avascular necrosis. Well-defined lucency with sclerotic border left iliac crest similar to prior CT suggestive of benign process. Vascular calcifications. IMPRESSION: Moderate right-sided and mild left-sided hip joint degenerative changes. Electronically Signed   By: Lacy Duverney M.D.   On: 03/18/2017 18:27    Initial Impression / Assessment and Plan / ED Course  I have reviewed the triage vital signs and the nursing notes.  Pertinent labs & imaging results that were available during my care of the patient were reviewed by me and considered in my medical decision making (see chart for details).     7:30 PM patient remains asymptomatic and pain-free. I consulted Dr. Mayford Knife, cardiologist at [facility] who requests transfer to [facility] telemetry unit. Intravenous heparin ordered , pharmacy to dose. She was administered aspirin orally as well as oral potassium supplementation  8:35 PM patient remains asymptomaticand pain-free Patient with inferior wall ST segment elevation on EKG. Code STEMI not called as patient pain-free and asymptomatic upon my exam Final Clinical Impressions(s) / ED Diagnoses  Diagnosis #1unstable angina #2 syncope #3 hypokalemia #4 renal insufficiency Final diagnoses:  None    New Prescriptions New Prescriptions   No medications on file     Doug Sou, MD 03/19/17 2036    Doug Sou, MD 03/19/17 2037

## 2017-03-19 NOTE — ED Triage Notes (Signed)
Pt started having chest pain at work, took a nitroglycerin, and had a syncopal episode.  Pt also started a new pain medication but can't remember what it is and she took it around 1330 today.  Reports was pain free after the nitro.  EMS reports pt was first degree and bradycardic on monitor.

## 2017-03-19 NOTE — Progress Notes (Signed)
ANTICOAGULATION CONSULT NOTE - Initial Consult  Pharmacy Consult for Heparin Indication: chest pain/ACS  Allergies  Allergen Reactions  . Sulfonamide Derivatives Itching    Patient Measurements: Height:  (172.7 cm) Weight: 165 lb (74.8 kg) IBW/kg (Calculated) : 63.9 HEPARIN DW (KG): 74.8  Vital Signs: Temp: 97.9 F (36.6 C) (10/02 1848) Temp Source: Oral (10/02 1848) BP: 126/56 (10/02 1848) Pulse Rate: 59 (10/02 1848)  Labs:  Recent Labs  03/19/17 1848  HGB 13.9  HCT 41.2  PLT 183  CREATININE 1.64*    Estimated Creatinine Clearance: 29 mL/min (A) (by C-G formula based on SCr of 1.64 mg/dL (H)).   Medical History: Past Medical History:  Diagnosis Date  . Arthritis    back  all over  . Hyperlipidemia   . Hypertension   . Peripheral vascular disease (HCC)   . Pneumonia     50 years  ago  . Sleep apnea      study but dont wear   mask    Medications:  See med rec  Assessment: 76 yo female presented to the ED with chest pain. MD requested pharmacy to start heparin.  Goal of Therapy:  Heparin level 0.3-0.7 units/ml Monitor platelets by anticoagulation protocol: Yes   Plan:  Give 4000 units bolus x 1 Start heparin infusion at 900 units/hr Check anti-Xa level in 6-8 hours and daily while on heparin Continue to monitor H&H and platelets   Elder Cyphers, BS Loura Back, BCPS Clinical Pharmacist Pager [phone]  03/19/2017,7:32 PM

## 2017-03-19 NOTE — ED Notes (Signed)
Pt's cbg per ems was 200.

## 2017-03-20 ENCOUNTER — Inpatient Hospital Stay (HOSPITAL_BASED_OUTPATIENT_CLINIC_OR_DEPARTMENT_OTHER): Payer: Medicare HMO

## 2017-03-20 ENCOUNTER — Inpatient Hospital Stay (HOSPITAL_COMMUNITY): Payer: Medicare HMO

## 2017-03-20 DIAGNOSIS — G4733 Obstructive sleep apnea (adult) (pediatric): Secondary | ICD-10-CM | POA: Diagnosis not present

## 2017-03-20 DIAGNOSIS — Z951 Presence of aortocoronary bypass graft: Secondary | ICD-10-CM | POA: Diagnosis not present

## 2017-03-20 DIAGNOSIS — I25700 Atherosclerosis of coronary artery bypass graft(s), unspecified, with unstable angina pectoris: Secondary | ICD-10-CM

## 2017-03-20 DIAGNOSIS — I214 Non-ST elevation (NSTEMI) myocardial infarction: Secondary | ICD-10-CM | POA: Insufficient documentation

## 2017-03-20 DIAGNOSIS — M25551 Pain in right hip: Secondary | ICD-10-CM

## 2017-03-20 DIAGNOSIS — I10 Essential (primary) hypertension: Secondary | ICD-10-CM | POA: Diagnosis not present

## 2017-03-20 DIAGNOSIS — R079 Chest pain, unspecified: Secondary | ICD-10-CM | POA: Diagnosis not present

## 2017-03-20 DIAGNOSIS — I6529 Occlusion and stenosis of unspecified carotid artery: Secondary | ICD-10-CM | POA: Diagnosis not present

## 2017-03-20 DIAGNOSIS — I5030 Unspecified diastolic (congestive) heart failure: Secondary | ICD-10-CM | POA: Diagnosis not present

## 2017-03-20 DIAGNOSIS — I200 Unstable angina: Secondary | ICD-10-CM | POA: Diagnosis not present

## 2017-03-20 DIAGNOSIS — E7849 Other hyperlipidemia: Secondary | ICD-10-CM | POA: Diagnosis not present

## 2017-03-20 DIAGNOSIS — R55 Syncope and collapse: Secondary | ICD-10-CM

## 2017-03-20 LAB — CBC
HCT: 41.1 % (ref 36.0–46.0)
Hemoglobin: 14 g/dL (ref 12.0–15.0)
MCH: 34.2 pg — ABNORMAL HIGH (ref 26.0–34.0)
MCHC: 34.1 g/dL (ref 30.0–36.0)
MCV: 100.5 fL — ABNORMAL HIGH (ref 78.0–100.0)
Platelets: 163 10*3/uL (ref 150–400)
RBC: 4.09 MIL/uL (ref 3.87–5.11)
RDW: 12.9 % (ref 11.5–15.5)
WBC: 7.9 10*3/uL (ref 4.0–10.5)

## 2017-03-20 LAB — TROPONIN I
Troponin I: <0.03 ng/mL (ref <0.03)
Troponin I: <0.03 ng/mL (ref <0.03)
Troponin I: <0.03 ng/mL (ref <0.03)

## 2017-03-20 LAB — NM MYOCAR MULTI W/SPECT W/WALL MOTION / EF
Exercise duration (min): 5 min
Exercise duration (sec): 43 s
Peak HR: 96 {beats}/min
Rest HR: 69 {beats}/min

## 2017-03-20 LAB — ECHOCARDIOGRAM COMPLETE
Height: 68 in
Weight: 2625.6 oz

## 2017-03-20 LAB — COMPREHENSIVE METABOLIC PANEL
ALT: 18 U/L (ref 14–54)
AST: 23 U/L (ref 15–41)
Albumin: 2.9 g/dL — ABNORMAL LOW (ref 3.5–5.0)
Alkaline Phosphatase: 65 U/L (ref 38–126)
Anion gap: 9 (ref 5–15)
BUN: 19 mg/dL (ref 6–20)
CO2: 31 mmol/L (ref 22–32)
Calcium: 8.8 mg/dL — ABNORMAL LOW (ref 8.9–10.3)
Chloride: 98 mmol/L — ABNORMAL LOW (ref 101–111)
Creatinine, Ser: 1.04 mg/dL — ABNORMAL HIGH (ref 0.44–1.00)
GFR calc Af Amer: 59 mL/min — ABNORMAL LOW (ref >60)
GFR calc non Af Amer: 50 mL/min — ABNORMAL LOW (ref >60)
Glucose, Bld: 101 mg/dL — ABNORMAL HIGH (ref 65–99)
Potassium: 3.5 mmol/L (ref 3.5–5.1)
Sodium: 138 mmol/L (ref 135–145)
Total Bilirubin: 1 mg/dL (ref 0.3–1.2)
Total Protein: 5.1 g/dL — ABNORMAL LOW (ref 6.5–8.1)

## 2017-03-20 LAB — LIPID PANEL
Cholesterol: 100 mg/dL (ref 0–200)
HDL: 42 mg/dL (ref >40)
LDL Cholesterol: 43 mg/dL (ref 0–99)
Total CHOL/HDL Ratio: 2.4 RATIO
Triglycerides: 74 mg/dL (ref <150)
VLDL: 15 mg/dL (ref 0–40)

## 2017-03-20 LAB — PROTIME-INR
INR: 1.12
Prothrombin Time: 14.3 seconds (ref 11.4–15.2)

## 2017-03-20 LAB — HEMOGLOBIN A1C
Hgb A1c MFr Bld: 5.8 % — ABNORMAL HIGH (ref 4.8–5.6)
Mean Plasma Glucose: 119.76 mg/dL

## 2017-03-20 LAB — TSH: TSH: 1.596 u[IU]/mL (ref 0.350–4.500)

## 2017-03-20 LAB — HEPARIN LEVEL (UNFRACTIONATED): Heparin Unfractionated: 0.65 IU/mL (ref 0.30–0.70)

## 2017-03-20 LAB — BRAIN NATRIURETIC PEPTIDE: B Natriuretic Peptide: 59.8 pg/mL (ref 0.0–100.0)

## 2017-03-20 MED ORDER — ASPIRIN EC 81 MG PO TBEC
81.0000 mg | DELAYED_RELEASE_TABLET | Freq: Every day | ORAL | Status: DC
  Administered 2017-03-20: 81 mg via ORAL
  Filled 2017-03-20: qty 1

## 2017-03-20 MED ORDER — CALCIUM CARBONATE-VITAMIN D 500-200 MG-UNIT PO TABS
1.0000 | ORAL_TABLET | Freq: Three times a day (TID) | ORAL | Status: DC
  Administered 2017-03-20: 13:00:00 1 via ORAL
  Filled 2017-03-20: qty 1

## 2017-03-20 MED ORDER — NITROGLYCERIN 0.4 MG SL SUBL: 0.4000 mg | SUBLINGUAL_TABLET | SUBLINGUAL | Status: DC | PRN

## 2017-03-20 MED ORDER — LISINOPRIL 20 MG PO TABS
20.0000 mg | ORAL_TABLET | Freq: Two times a day (BID) | ORAL | Status: DC
  Administered 2017-03-20 (×2): 20 mg via ORAL
  Filled 2017-03-20 (×2): qty 1

## 2017-03-20 MED ORDER — OMEGA-3-ACID ETHYL ESTERS 1 G PO CAPS
1.0000 g | ORAL_CAPSULE | Freq: Two times a day (BID) | ORAL | Status: DC
  Administered 2017-03-20 (×2): 1 g via ORAL
  Filled 2017-03-20 (×2): qty 1

## 2017-03-20 MED ORDER — HYDROCODONE-ACETAMINOPHEN 5-325 MG PO TABS
1.0000 | ORAL_TABLET | Freq: Four times a day (QID) | ORAL | Status: DC | PRN
Start: 2017-03-20 — End: 2017-03-20
  Administered 2017-03-20: 1 via ORAL
  Filled 2017-03-20: qty 1

## 2017-03-20 MED ORDER — MORPHINE SULFATE (PF) 2 MG/ML IV SOLN
2.0000 mg | Freq: Once | INTRAVENOUS | Status: AC
  Administered 2017-03-20: 2 mg via INTRAVENOUS
  Filled 2017-03-20: qty 1

## 2017-03-20 MED ORDER — RALOXIFENE HCL 60 MG PO TABS
60.0000 mg | ORAL_TABLET | ORAL | Status: DC
  Administered 2017-03-20: 60 mg via ORAL
  Filled 2017-03-20: qty 1

## 2017-03-20 MED ORDER — ALPRAZOLAM 0.25 MG PO TABS: 0.2500 mg | ORAL_TABLET | Freq: Three times a day (TID) | ORAL | Status: DC | PRN

## 2017-03-20 MED ORDER — POTASSIUM CHLORIDE CRYS ER 10 MEQ PO TBCR
10.0000 meq | EXTENDED_RELEASE_TABLET | Freq: Two times a day (BID) | ORAL | Status: DC
  Administered 2017-03-20 (×2): 10 meq via ORAL
  Filled 2017-03-20 (×2): qty 1

## 2017-03-20 MED ORDER — ATORVASTATIN CALCIUM 40 MG PO TABS
40.0000 mg | ORAL_TABLET | Freq: Every day | ORAL | Status: DC
  Administered 2017-03-20: 40 mg via ORAL
  Filled 2017-03-20: qty 1

## 2017-03-20 MED ORDER — REGADENOSON 0.4 MG/5ML IV SOLN
INTRAVENOUS | Status: AC
  Filled 2017-03-20: qty 5

## 2017-03-20 MED ORDER — NITROGLYCERIN 0.4 MG SL SUBL: 0.4000 mg | SUBLINGUAL_TABLET | SUBLINGUAL | 1 refills | Status: AC | PRN

## 2017-03-20 MED ORDER — ADULT MULTIVITAMIN W/MINERALS CH
1.0000 | ORAL_TABLET | Freq: Every day | ORAL | Status: DC
  Administered 2017-03-20: 1 via ORAL
  Filled 2017-03-20: qty 1

## 2017-03-20 MED ORDER — VITAMIN D 1000 UNITS PO TABS
2000.0000 [IU] | ORAL_TABLET | Freq: Every day | ORAL | Status: DC
  Administered 2017-03-20: 2000 [IU] via ORAL
  Filled 2017-03-20: qty 2

## 2017-03-20 MED ORDER — VITAMIN D 1000 UNITS PO TABS
3000.0000 [IU] | ORAL_TABLET | Freq: Every day | ORAL | Status: DC
  Administered 2017-03-20: 3000 [IU] via ORAL
  Filled 2017-03-20: qty 3

## 2017-03-20 MED ORDER — ACETAMINOPHEN 500 MG PO TABS: 1000.0000 mg | ORAL_TABLET | Freq: Three times a day (TID) | ORAL | Status: DC | PRN

## 2017-03-20 MED ORDER — REGADENOSON 0.4 MG/5ML IV SOLN
0.4000 mg | Freq: Once | INTRAVENOUS | Status: AC
  Administered 2017-03-20: 0.4 mg via INTRAVENOUS
  Filled 2017-03-20: qty 5

## 2017-03-20 MED ORDER — AMLODIPINE BESYLATE 5 MG PO TABS
5.0000 mg | ORAL_TABLET | Freq: Every day | ORAL | Status: DC
  Administered 2017-03-20: 5 mg via ORAL
  Filled 2017-03-20: qty 1

## 2017-03-20 MED ORDER — ESTRADIOL 0.1 MG/GM VA CREA
1.0000 g | TOPICAL_CREAM | VAGINAL | Status: DC
  Filled 2017-03-20: qty 42.5

## 2017-03-20 MED ORDER — VITAMIN D 1000 UNITS PO TABS: 2000.0000 [IU] | ORAL_TABLET | Freq: Two times a day (BID) | ORAL | Status: DC

## 2017-03-20 MED ORDER — TECHNETIUM TC 99M TETROFOSMIN IV KIT
10.0000 | PACK | Freq: Once | INTRAVENOUS | Status: AC | PRN
  Administered 2017-03-20: 10 via INTRAVENOUS

## 2017-03-20 MED ORDER — TECHNETIUM TC 99M TETROFOSMIN IV KIT
30.0000 | PACK | Freq: Once | INTRAVENOUS | Status: AC | PRN
  Administered 2017-03-20: 30 via INTRAVENOUS

## 2017-03-20 MED ORDER — TRIAMTERENE-HCTZ 37.5-25 MG PO TABS
1.0000 | ORAL_TABLET | ORAL | Status: DC
  Administered 2017-03-20: 1 via ORAL
  Filled 2017-03-20: qty 1

## 2017-03-20 NOTE — Care Management Obs Status (Signed)
MEDICARE OBSERVATION STATUS NOTIFICATION   Patient Details  Name: Monica Stafford MRN: [id number] Date of Birth: May 10, 1941   Medicare Observation Status Notification Given:  Yes    Elliot Cousin, RN 03/20/2017, 3:45 PM

## 2017-03-20 NOTE — Progress Notes (Signed)
  IV morphine x1 verbal order from Dr, Marilu Favre for patient complaining of right hip pain. 8/10

## 2017-03-20 NOTE — H&P (Signed)
CARDIOLOGY INPATIENT HISTORY AND PHYSICAL EXAMINATION NOTE  Patient ID: Monica Stafford MRN: [id number], DOB/AGE: 08-08-1940   Admit date: 03/19/2017   Primary Physician: Gareth Morgan, MD Primary Cardiologist: Dina Rich MD  Reason for admission: chest pain   HPI: This is a 76 y.o. female known DOE s/p CABG ((LIMA-LAD,RIMA-RCA,SVG-OM1,SVG-D2). LVEF 69% by LVgram in 2002), HTN, HLD, carotid stenosis s/p left CEA x  2, OSA w/o CPAP presented with chest pain.  Patient was woking at [facility] when she stared having chest pain. She took a NTG prescribed to her 4 y ago. There was no radiation, 8/10 in intensity. Patient passed out after taking NTG. She went to the [facility] ED.  NTG partially resolved and was completely gone by the time she reached hospital.  Of note patient was placed on pain medications by her PCP yesterday for hip osteoarthritis and she thinks that they led to her chest pain.   Problem List: Past Medical History:  Diagnosis Date  . Arthritis    back  all over  . Hyperlipidemia   . Hypertension   . Peripheral vascular disease (HCC)   . Pneumonia     50 years  ago  . Sleep apnea      study but dont wear   mask    Past Surgical History:  Procedure Laterality Date  . ABDOMINAL HYSTERECTOMY  1998   [facility]  . CARDIAC CATHETERIZATION    . CATARACT EXTRACTION W/PHACO  12/10/2011   Procedure: CATARACT EXTRACTION PHACO AND INTRAOCULAR LENS PLACEMENT (IOC);  Surgeon: Gemma Payor, MD;  Location: [facility];  Service: Ophthalmology;  Laterality: Right;  CDE:10.40  . CATARACT EXTRACTION W/PHACO  01/07/2012   Procedure: CATARACT EXTRACTION PHACO AND INTRAOCULAR LENS PLACEMENT (IOC);  Surgeon: Gemma Payor, MD;  Location: [facility];  Service: Ophthalmology;  Laterality: Left;  CDE 8.38  . CORONARY ARTERY BYPASS GRAFT  12/23/2000   [facility]  . LAPAROSCOPIC APPENDECTOMY N/A 02/27/2016   Procedure: APPENDECTOMY LAPAROSCOPIC;  Surgeon: Franky Macho, MD;  Location:  [facility];  Service: General;  Laterality: N/A;     Allergies:  Allergies  Allergen Reactions  . Sulfonamide Derivatives Itching     Home Medications Current Facility-Administered Medications  Medication Dose Route Frequency Provider Last Rate Last Dose  . acetaminophen (TYLENOL) tablet 1,000 mg  1,000 mg Oral TID PRN Timoteo Expose T, MD      . ALPRAZolam Prudy Feeler) tablet 0.25-1 mg  0.25-1 mg Oral TID PRN Timoteo Expose T, MD      . amLODipine (NORVASC) tablet 5 mg  5 mg Oral Daily Timoteo Expose T, MD      . aspirin EC tablet 81 mg  81 mg Oral Daily Timoteo Expose T, MD      . atorvastatin (LIPITOR) tablet 40 mg  40 mg Oral Daily Timoteo Expose T, MD      . Calcium Carbonate-Vitamin D 600-400 MG-UNIT 1 tablet  1 tablet Oral TID Timoteo Expose T, MD      . cholecalciferol (VITAMIN D) tablet 2,000-3,000 Units  2,000-3,000 Units Oral BID Timoteo Expose T, MD      . estradiol (ESTRACE) vaginal cream 1 g  1 g Vaginal Once per day on Mon Wed Fri Stephaie Dardis T, MD      . fish oil-omega-3 fatty acids capsule 1 g  1 g Oral BID Timoteo Expose T, MD      . heparin ADULT infusion 100 units/mL (25000 units/27mL sodium chloride 0.45%)  900 Units/hr Intravenous Continuous Antoine Poche, MD 9 mL/hr at 03/19/17 1949 900 Units/hr at 03/19/17 1949  . HYDROcodone-acetaminophen (NORCO/VICODIN) 5-325 MG per tablet 1 tablet  1 tablet Oral Q6H PRN Timoteo Expose T, MD      . lisinopril (PRINIVIL,ZESTRIL) tablet 20 mg  20 mg Oral BID Timoteo Expose T, MD      . multivitamin with minerals tablet 1 tablet  1 tablet Oral Daily Timoteo Expose T, MD      . potassium chloride (MICRO-K) CR capsule 10 mEq  10 mEq Oral BID Timoteo Expose T, MD      . raloxifene (EVISTA) tablet 60 mg  60 mg Oral Newman Nickels T, MD      . triamterene-hydrochlorothiazide (MAXZIDE) 75-50 MG per tablet 0.5 tablet  0.5 tablet Oral [place], MD         Family History  Problem Relation Age of Onset  . Heart  disease Father        before age 28  . Heart attack Father   . Parkinson's disease Sister   . Colitis Sister      Social History   Social History  . Marital status: Divorced    Spouse name: N/A  . Number of children: N/A  . Years of education: N/A   Occupational History  . Not on file.   Social History Main Topics  . Smoking status: Never Smoker  . Smokeless tobacco: Never Used  . Alcohol use No  . Drug use: No  . Sexual activity: Not Currently   Other Topics Concern  . Not on file   Social History Narrative  . No narrative on file     Review of Systems: General: negative for chills, fever, night sweats or weight changes.  Cardiovascular: chest pain, hot feeling negative for dyspnea on exertion, edema, orthopnea, palpitations, paroxysmal nocturnal dyspnea or shortness of breath  Dermatological: negative for rash Respiratory: negative for cough or wheezing Urologic: negative for hematuria Abdominal: negative for nausea, vomiting, diarrhea, bright red blood per rectum, melena, or hematemesis Neurologic: negative for visual changes, syncope, or dizziness Endocrine: no diabetes, no hypothyroidism Immunological: no lymph adenopathy Psych: non homicidal/suicidal  Physical Exam: Vitals: BP (!) 148/62 (BP Location: Right Arm)   Pulse (!) 57   Temp 98 F (36.7 C) (Oral)   Resp 16   Ht  (1.727 m)   Wt 74.4 kg (164 lb 1.6 oz)   SpO2 100%   BMI 24.95 kg/m  General: not in acute distress Neck: JVP flat, neck supple Heart: regular rate and rhythm, S1, S2, no murmurs  Lungs: CTAB  GI: non tender, non distended, bowel sounds present Extremities: no edema Neuro: AAO x 3  Psych: normal affect, no anxiety   Labs:   Results for orders placed or performed during the hospital encounter of 03/19/17 (from the past 24 hour(s))  CBC with Differential     Status: Abnormal   Collection Time: 03/19/17  6:48 PM  Result Value Ref Range   WBC 8.6 4.0 - 10.5 K/uL   RBC  4.05 3.87 - 5.11 MIL/uL   Hemoglobin 13.9 12.0 - 15.0 g/dL   HCT 47.8 29.5 - 62.1 %   MCV 101.7 (H) 78.0 - 100.0 fL   MCH 34.3 (H) 26.0 - 34.0 pg   MCHC 33.7 30.0 - 36.0 g/dL   RDW 30.8 65.7 - 84.6 %   Platelets 183 150 - 400 K/uL   Neutrophils Relative %  57 %   Neutro Abs 5.0 1.7 - 7.7 K/uL   Lymphocytes Relative 29 %   Lymphs Abs 2.5 0.7 - 4.0 K/uL   Monocytes Relative 11 %   Monocytes Absolute 0.9 0.1 - 1.0 K/uL   Eosinophils Relative 2 %   Eosinophils Absolute 0.2 0.0 - 0.7 K/uL   Basophils Relative 1 %   Basophils Absolute 0.1 0.0 - 0.1 K/uL  Basic metabolic panel     Status: Abnormal   Collection Time: 03/19/17  6:48 PM  Result Value Ref Range   Sodium 134 (L) 135 - 145 mmol/L   Potassium 3.1 (L) 3.5 - 5.1 mmol/L   Chloride 93 (L) 101 - 111 mmol/L   CO2 33 (H) 22 - 32 mmol/L   Glucose, Bld 181 (H) 65 - 99 mg/dL   BUN 27 (H) 6 - 20 mg/dL   Creatinine, Ser 1.61 (H) 0.44 - 1.00 mg/dL   Calcium 9.8 8.9 - 09.6 mg/dL   GFR calc non Af Amer 29 (L) >60 mL/min   GFR calc Af Amer 34 (L) >60 mL/min   Anion gap 8 5 - 15  Protime-INR     Status: None   Collection Time: 03/19/17  6:48 PM  Result Value Ref Range   Prothrombin Time 13.5 11.4 - 15.2 seconds   INR 1.04   I-Stat Troponin, ED (not at [facility])     Status: None   Collection Time: 03/19/17  7:01 PM  Result Value Ref Range   Troponin i, poc 0.00 0.00 - 0.08 ng/mL   Comment 3             Radiology/Studies: Dg Lumbar Spine Complete  Result Date: 03/18/2017 CLINICAL DATA:  76 year old female with right hip pain for 5 months. No known injury. Initial encounter. EXAM: LUMBAR SPINE - COMPLETE 4+ VIEW COMPARISON:  02/27/2016 CT FINDINGS: Scoliosis lumbar spine convex left. No pars defect.  Facet degenerative changes L3-4 through L5-S1. Minimal anterior slip/rotation L3 and L4. Very mild L5-S1 disc space narrowing. 3.4 x 2.8 mm lucency with well-defined sclerotic borders left ilium similar to prior CT suggestive of a benign process.  Vascular calcifications. IMPRESSION: Scoliosis lumbar spine convex left. Facet degenerative changes L3-4 through L5-S1. Minimal anterior slip/rotation L3 and L4. Very mild L5-S1 disc space narrowing. 3.4 x 2.8 mm lucency with well-defined sclerotic borders left ilium similar to prior CT suggestive of a benign process. Aortic Atherosclerosis (ICD10-I70.0). Electronically Signed   By: Lacy Duverney M.D.   On: 03/18/2017 18:33   Dg Chest Port 1 View  Result Date: 03/19/2017 CLINICAL DATA:  Central chest pain.  Syncope. EXAM: PORTABLE CHEST 1 VIEW COMPARISON:  February 27, 2016 FINDINGS: The patient is status post CABG. The cardiomediastinal silhouette is stable. No pneumothorax. No pulmonary nodules or masses. No focal infiltrates. No acute abnormalities. IMPRESSION: No active disease. Electronically Signed   By: Gerome Sam III M.D   On: 03/19/2017 19:36   Dg Hip Unilat With Pelvis Min 4 Views Right  Result Date: 03/18/2017 CLINICAL DATA:  76 year old female with right hip pain for 5 months. No known injury. Initial encounter. EXAM: DG HIP (WITH OR WITHOUT PELVIS) 4+V RIGHT COMPARISON:  02/27/2016 CT. FINDINGS: Moderate right side and mild left sided hip joint degenerative changes. No plain film evidence of avascular necrosis. Well-defined lucency with sclerotic border left iliac crest similar to prior CT suggestive of benign process. Vascular calcifications. IMPRESSION: Moderate right-sided and mild left-sided hip joint degenerative changes. Electronically Signed  By: Lacy Duverney M.D.   On: 03/18/2017 18:27    EKG: normal sinus rhythm, no significant ST/T wave changes  Echo: pending  Cardiac cath:  12/2000 ANGIOGRAPHIC DATA: 1. Left ventriculography was performed in the RAO projection.  Overall    ventricular systolic function was normal.  No segmental abnormalities    or contraction were identified.  There did not appear to be significant    mitral regurgitation.  Ejection fraction was  calculated at 69%. 2. Subclavian angiography revealed a patent subclavian artery, as well as    an internal mammary artery.  The internal mammary appeared to be suitable    for grafting. 3. The left main coronary artery was free of critical disease. 4. The left anterior descending artery coursed to the apex where it wrapped    the apical tip.  The LAD had about a 50% area of narrowing beyond the    origin of the first diagonal.  The first diagonal itself had about 50%    narrowing.  Distal to this, there was a segmental area of plaquing leading    into an approximate 90% stenosis just after the large anterolateral branch.    This anterolateral branch itself was a large caliber vessel that had about    80% proximal narrowing.  Both vessels appeared to be suitable for grafting. 5. The circumflex artery had mild plaquing of approximately 20% to 30% prior    to the first marginal.  The first marginal itself had about 50% ostial and    then a mid 60% stenosis.  This vessel also appeared to be suitable for    grafting. 6. The right coronary artery was a large dominant vessel providing a moderate    sized posterior descending and a very large posterolateral branch.  The    right coronary artery had a 20% to 30% narrowing in the proximal vessel.    In the mid vessel was a focal 90% stenosis.  CONCLUSIONS: 1. Preserved left ventricular function. 2. Patent internal mammary artery. 3. 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 4. Moderate stenosis of the circumflex artery. 5. 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 Medical decision making:  Discussed care with the patient Discussed care with the physician on the phone Reviewed labs and imaging personally Reviewed prior records  ASSESSMENT AND PLAN:  This is a 76 y.o. female DOE s/p CABG ((LIMA-LAD,RIMA-RCA,SVG-OM1,SVG-D2). LVEF 69% by LVgram in 2002), HTN, HLD,  carotid stenosis s/p left CEA x  2, OSA w/o CPAP presented with chest pain.    Unstable angina  Pain while exerting, no recent chest pain, prior CABG,  Negative biomarkers and negative EKG - admit to telemetry floor  - recommend IV heparin - cycle troponin - echocardiogram in the AM - NPO post midnight - CBC, CMP, INR/PTT - TSH, HbA1c, lipid panel - consider stress test in the AM vs. consider cardiac cath   2. Carotid stenosis - continue to follow with vacular  3. Hyperlipidemia - continue statin - request lipid panel from pcp  4. HTN essential  - reasonable control, continue current meds  5. Hip pain  - morphine prn   Signed, Joellyn Rued, MD MS 03/20/2017, 1:35 AM

## 2017-03-20 NOTE — Progress Notes (Signed)
Discharged to home, daughter at bedside to transport patient. D/c instructions and follow up appointments discussed with patient, verbalized understanding.

## 2017-03-20 NOTE — Discharge Summary (Signed)
Discharge Summary    Patient ID: Monica Stafford,  MRN: [id number], DOB/AGE: 08-20-1940 76 y.o.  Admit date: 03/19/2017 Discharge date: 03/20/2017   Primary Care Provider: Gareth Morgan Primary Cardiologist: Dr. Wyline Mood  Discharge Diagnoses    Principal Problem:   Chest pain Active Problems:   OBSTRUCTIVE SLEEP APNEA   Carotid stenosis, asymptomatic   Hx of CABG x 4 2002   Essential hypertension   Dyslipidemia   Edema of both legs   Vasovagal syncope   Allergies Allergies  Allergen Reactions  . Sulfonamide Derivatives Itching     History of Present Illness     Monica Stafford is a 76 y.o. female known DOE s/p CABG (LIMA-LAD, RIMA-RCA, SVG-OM1, SVG-D2), LVEF 69% by LVgram in 2002, HTN, HLD, carotid stenosis s/p left CEA x  2, OSA w/o CPAP presented with chest pain.    She was working at [employer] when she stared having chest pain. She took a NTG prescribed to her 4 years ago. Of note, the nitro did not burn under her tongue. There was no radiation, 8/10 in intensity. She became nauseated and diaphoretic and then passed out. She denies heart racing. She went to the [facility] ED.  NTG partially resolved her pain and was completely gone by the time she reached hospital. Of note patient was placed on pain medications by her PCP yesterday for hip osteoarthritis and she thinks that the painmedication led to her chest pain.   Troponins negative overnight. History of CABG, but story not typical for angina. Very likely vasovagal syncope - was noted to be bradycardic when EMS arrived. Will get a lexiscan myoview today. She is on IV heparin, which can be discontinued if her myoview is negative.   Hospital Course     Consultants: none  Pt underwent stress myoview which showed a tiny defect at the apex with normal EF. The study was read as low-risk. Heparin drip was stopped. Results of her test were discussed and a new nitro SL prescription given.   Echocardiogram with normal EF of  60-65% and grade 1 DD. Chordal SAM noted.  All home medications were continued with the exception of duplicate percocet prescriptions.   Patient seen and examined by Dr. Rennis Golden today and was stable for discharge. All follow up has been arranged.  _____________  Discharge Vitals Blood pressure (!) 169/67, pulse 75, temperature 97.6 F (36.4 C), temperature source Oral, resp. rate 18, height  (1.727 m), weight 164 lb 1.6 oz (74.4 kg), SpO2 100 %.  Filed Weights   03/19/17 1839 03/20/17 0055  Weight: 165 lb (74.8 kg) 164 lb 1.6 oz (74.4 kg)    Labs & Radiologic Studies    CBC  Recent Labs  03/19/17 1848 03/20/17 0142  WBC 8.6 7.9  NEUTROABS 5.0  --   HGB 13.9 14.0  HCT 41.2 41.1  MCV 101.7* 100.5*  PLT 183 163   Basic Metabolic Panel  Recent Labs  03/19/17 1848 03/20/17 0552  NA 134* 138  K 3.1* 3.5  CL 93* 98*  CO2 33* 31  GLUCOSE 181* 101*  BUN 27* 19  CREATININE 1.64* 1.04*  CALCIUM 9.8 8.8*   Liver Function Tests  Recent Labs  03/20/17 0552  AST 23  ALT 18  ALKPHOS 65  BILITOT 1.0  PROT 5.1*  ALBUMIN 2.9*   No results for input(s): LIPASE, AMYLASE in the last 72 hours. Cardiac Enzymes  Recent Labs  03/20/17 0142 03/20/17 0552  TROPONINI <  0.03 <0.03   BNP Invalid input(s): POCBNP D-Dimer No results for input(s): DDIMER in the last 72 hours. Hemoglobin A1C  Recent Labs  03/20/17 0142  HGBA1C 5.8*   Fasting Lipid Panel  Recent Labs  03/20/17 0142  CHOL 100  HDL 42  LDLCALC 43  TRIG 74  CHOLHDL 2.4   Thyroid Function Tests  Recent Labs  03/20/17 0142  TSH 1.596   _____________  Dg Lumbar Spine Complete  Result Date: 03/18/2017 CLINICAL DATA:  76 year old female with right hip pain for 5 months. No known injury. Initial encounter. EXAM: LUMBAR SPINE - COMPLETE 4+ VIEW COMPARISON:  02/27/2016 CT FINDINGS: Scoliosis lumbar spine convex left. No pars defect.  Facet degenerative changes L3-4 through L5-S1. Minimal anterior  slip/rotation L3 and L4. Very mild L5-S1 disc space narrowing. 3.4 x 2.8 mm lucency with well-defined sclerotic borders left ilium similar to prior CT suggestive of a benign process. Vascular calcifications. IMPRESSION: Scoliosis lumbar spine convex left. Facet degenerative changes L3-4 through L5-S1. Minimal anterior slip/rotation L3 and L4. Very mild L5-S1 disc space narrowing. 3.4 x 2.8 mm lucency with well-defined sclerotic borders left ilium similar to prior CT suggestive of a benign process. Aortic Atherosclerosis (ICD10-I70.0). Electronically Signed   By: Lacy Duverney M.D.   On: 03/18/2017 18:33   Nm Myocar Multi W/spect W/wall Motion / Ef  Result Date: 03/20/2017 CLINICAL DATA:  Chest pain EXAM: MYOCARDIAL IMAGING WITH SPECT (REST AND PHARMACOLOGIC-STRESS) GATED LEFT VENTRICULAR WALL MOTION STUDY LEFT VENTRICULAR EJECTION FRACTION TECHNIQUE: Standard myocardial SPECT imaging was performed after resting intravenous injection of 10 mCi Tc-39m tetrofosmin. Subsequently, intravenous infusion of Lexiscan was performed under the supervision of the Cardiology staff. At peak effect of the drug, 30 mCi Tc-76m tetrofosmin was injected intravenously and standard myocardial SPECT imaging was performed. Quantitative gated imaging was also performed to evaluate left ventricular wall motion, and estimate left ventricular ejection fraction. COMPARISON:  None. FINDINGS: Perfusion: Tiny fixed perfusion defect at the apex only. Wall Motion: Normal left ventricular wall motion. No left ventricular dilation. Left Ventricular Ejection Fraction: 64 % End diastolic volume 84 ml End systolic volume 30 ml IMPRESSION: 1. Tiny fixed perfusion defect at the apex. 2. Normal left ventricular wall motion. 3. Left ventricular ejection fraction 64% 4. Non invasive risk stratification*: Low risk. *2012 Appropriate Use Criteria for Coronary Revascularization Focused Update: J Am Coll Cardiol. 2012;59(9):857-881.  http://content.dementiazones.com.aspx?[email or web address] Electronically Signed   By: Jolaine Click M.D.   On: 03/20/2017 14:09   Dg Chest Port 1 View  Result Date: 03/19/2017 CLINICAL DATA:  Central chest pain.  Syncope. EXAM: PORTABLE CHEST 1 VIEW COMPARISON:  February 27, 2016 FINDINGS: The patient is status post CABG. The cardiomediastinal silhouette is stable. No pneumothorax. No pulmonary nodules or masses. No focal infiltrates. No acute abnormalities. IMPRESSION: No active disease. Electronically Signed   By: Gerome Sam III M.D   On: 03/19/2017 19:36   Dg Hip Unilat With Pelvis Min 4 Views Right  Result Date: 03/18/2017 CLINICAL DATA:  76 year old female with right hip pain for 5 months. No known injury. Initial encounter. EXAM: DG HIP (WITH OR WITHOUT PELVIS) 4+V RIGHT COMPARISON:  02/27/2016 CT. FINDINGS: Moderate right side and mild left sided hip joint degenerative changes. No plain film evidence of avascular necrosis. Well-defined lucency with sclerotic border left iliac crest similar to prior CT suggestive of benign process. Vascular calcifications. IMPRESSION: Moderate right-sided and mild left-sided hip joint degenerative changes. Electronically Signed   By: Ernie Hew.D.  On: 03/18/2017 18:27     Diagnostic Studies/Procedures    Myoview 03/20/17: IMPRESSION: 1. Tiny fixed perfusion defect at the apex. 2. Normal left ventricular wall motion. 3. Left ventricular ejection fraction 64% 4. Non invasive risk stratification*: Low risk.   Echocardiogram 03/20/17: Study Conclusions - Left ventricle: The cavity size was normal. Wall thickness was   increased in a pattern of mild LVH. Systolic function was normal.   The estimated ejection fraction was in the range of 60% to 65%.   Chordal SAM noted. Wall motion was normal; there were no regional   wall motion abnormalities. Doppler parameters are consistent with   abnormal left ventricular relaxation (grade 1  diastolic   dysfunction). - Aortic valve: There was no stenosis. - Mitral valve: Mildly calcified annulus. - Right ventricle: The cavity size was normal. Systolic function   was normal. - Tricuspid valve: Peak RV-RA gradient (S): 20 mm Hg. - Pulmonary arteries: PA peak pressure: 23 mm Hg (S). - Inferior vena cava: The vessel was normal in size. The   respirophasic diameter changes were in the normal range (>= 50%),   consistent with normal central venous pressure.  Impressions: - Normal LV size with mild LV hypertrophy. EF 60-65%. Normal RV   size and systolic function. No significant valvular   abnormalities.  Disposition   Pt is being discharged home today in good condition.  Follow-up Plans & Appointments    Follow-up Information    Antoine Poche, MD Follow up on 04/08/2017.   Specialty:  Cardiology Why:  8:20 am for hospital follow up Contact information: [address] [place] [address] [phone]          Discharge Instructions    Diet - low sodium heart healthy    Complete by:  As directed    Discharge instructions    Complete by:  As directed    No driving for 48 hr. No lifting over 5 lbs for 1 week. No sexual activity for 1 week. Keep procedure site clean & dry. If you notice increased pain, swelling, bleeding or pus, call/return!  You may shower, but no soaking baths/hot tubs/pools for 1 week.   Increase activity slowly    Complete by:  As directed       Discharge Medications   Current Discharge Medication List    START taking these medications   Details  nitroGLYCERIN (NITROSTAT) 0.4 MG SL tablet Place 1 tablet (0.4 mg total) under the tongue every 5 (five) minutes as needed for chest pain. Qty: 25 tablet, Refills: 1      CONTINUE these medications which have NOT CHANGED   Details  acetaminophen (TYLENOL) 500 MG tablet Take 1,000 mg by mouth 3 (three) times daily as needed for mild pain or moderate pain.    alendronate (FOSAMAX)  70 MG tablet Take 70 mg by mouth every 7 (seven) days. Take with a full glass of water on an empty stomach. Usually on Monday    ALPRAZolam (XANAX) 1 MG tablet Take 0.25-1 mg by mouth 3 (three) times daily as needed for anxiety.     amLODipine (NORVASC) 5 MG tablet Take 1 tablet (5 mg total) by mouth daily. Qty: 90 tablet, Refills: 3    aspirin EC 81 MG tablet Take 81 mg by mouth daily.    atorvastatin (LIPITOR) 80 MG tablet Take 0.5 tablets (40 mg total) by mouth daily. Qty: 90 tablet, Refills: 3    Calcium Carbonate-Vitamin D (CALTRATE 600+D) 600-400  MG-UNIT per tablet Take 1 tablet by mouth 3 (three) times daily.     cholecalciferol (VITAMIN D) 1000 UNITS tablet Take 2,000-3,000 Units by mouth 2 (two) times daily. 3 CAPSULES IN THE MORNING AND 2 CAPSULES AT BEDTIME    estradiol (ESTRACE) 0.1 MG/GM vaginal cream Place 1 g vaginally 3 (three) times a week.     fish oil-omega-3 fatty acids 1000 MG capsule Take 1 g by mouth 2 (two) times daily.     furosemide (LASIX) 40 MG tablet Take 20 mg by mouth daily. May also Take 20 mg daily as needed for swelling.    HYDROcodone-acetaminophen (NORCO/VICODIN) 5-325 MG tablet Take 1 tablet by mouth every 6 (six) hours as needed for moderate pain or severe pain.     lisinopril (PRINIVIL,ZESTRIL) 20 MG tablet Take 20 mg by mouth 2 (two) times daily.    metoprolol tartrate (LOPRESSOR) 25 MG tablet Take 1 tablet (25 mg total) by mouth 2 (two) times daily. Qty: 180 tablet, Refills: 3    Multiple Vitamin (MULTIVITAMIN WITH MINERALS) TABS Take 1 tablet by mouth daily.    potassium chloride (MICRO-K) 10 MEQ CR capsule Take 10 mEq by mouth 2 (two) times daily.    raloxifene (EVISTA) 60 MG tablet Take 60 mg by mouth every other day.     triamterene-hydrochlorothiazide (MAXZIDE) 75-50 MG per tablet Take 0.5 tablets by mouth every other day.    naproxen (NAPROSYN) 375 MG tablet Take 1 tablet (375 mg total) by mouth 2 (two) times daily. Qty: 20 tablet,  Refills: 0    orphenadrine (NORFLEX) 100 MG tablet Take 1 tablet (100 mg total) by mouth 2 (two) times daily. Qty: 20 tablet, Refills: 0    predniSONE (DELTASONE) 10 MG tablet Take 10 mg by mouth as directed.       STOP taking these medications     oxyCODONE-acetaminophen (PERCOCET) 5-325 MG tablet      oxyCODONE-acetaminophen (PERCOCET) 5-325 MG tablet            Outstanding Labs/Studies     Duration of Discharge Encounter   Greater than 30 minutes including physician time.  Signed, Roe Rutherford Duke PA-C 03/20/2017, 3:22 PM

## 2017-03-20 NOTE — Progress Notes (Signed)
Seen by the fellow this am. No chest pain overnight. Was working at [employer] - took an unknown pain med for hip pain, about 4 hours later had chest discomfort and nausea. Took nitro without relief, did not burn under her tongue. Came in from her car and became nauseated and diaphoretic then passed out. Denies any heart racing. Troponins negative overnight. History of CABG, but story not typical for angina. Very likely vasovagal syncope - was noted to be bradycardic when EMS arrived. Will get a lexiscan myoview today. She is on IV heparin, which can be discontinued if her myoview is negative.  Chrystie Nose, MD, [facility]  [place]  [facility]  Attending Cardiologist  Direct Dial: [phone]  Fax: [phone]  Website:  [email or web address]

## 2017-03-20 NOTE — Progress Notes (Signed)
Pre VS for lexiscan

## 2017-03-20 NOTE — Progress Notes (Signed)
ANTICOAGULATION CONSULT NOTE  Pharmacy Consult for Heparin Indication: chest pain/ACS  Allergies  Allergen Reactions  . Sulfonamide Derivatives Itching    Patient Measurements: Height:  (172.7 cm) Weight: 164 lb 1.6 oz (74.4 kg) IBW/kg (Calculated) : 63.9 HEPARIN DW (KG): 74.4  Vital Signs: Temp: 98 F (36.7 C) (10/03 0055) Temp Source: Oral (10/03 0055) BP: 148/62 (10/03 0055) Pulse Rate: 57 (10/03 0055)  Labs:  Recent Labs  03/19/17 1848 03/20/17 0142  HGB 13.9 14.0  HCT 41.2 41.1  PLT 183 163  LABPROT 13.5  --   INR 1.04  --   HEPARINUNFRC  --  0.65  CREATININE 1.64*  --   TROPONINI  --  <0.03    Estimated Creatinine Clearance: 29 mL/min (A) (by C-G formula based on SCr of 1.64 mg/dL (H)).   Assessment: 76 y.o. female with chest pain for heparin  Goal of Therapy:  Heparin level 0.3-0.7 units/ml Monitor platelets by anticoagulation protocol: Yes   Plan:  Continue Heparin at current rate   Geannie Risen, PharmD, BCPS  03/20/2017,3:40 AM

## 2017-03-20 NOTE — Care Management Note (Signed)
Case Management Note  Patient Details  Name: Monica Stafford MRN: [id number] Date of Birth: [birth year]  Subjective/Objective:      Chest pain, Syncope               Action/Plan: Discharge Planning: NCM spoke to pt and states she was independent pta. States she has dtr that will assist her as needed. Pt denies needing any DME. States she can borrow RW if needed.   PCP Gareth Morgan MD  Expected Discharge Date:  03/20/17               Expected Discharge Plan:  Home/Self Care  In-House Referral:  NA  Discharge planning Services  CM Consult  Post Acute Care Choice:  NA Choice offered to:  NA  DME Arranged:  N/A DME Agency:  NA  HH Arranged:  NA HH Agency:  NA  Status of Service:  Completed, signed off  If discussed at Long Length of Stay Meetings, dates discussed:    Additional Comments:  Elliot Cousin, RN 03/20/2017, 3:51 PM

## 2017-03-20 NOTE — Care Management CC44 (Signed)
Condition Code 44 Documentation Completed  Patient Details  Name: Monica Stafford MRN: [id number] Date of Birth: Sep 24, 1940   Condition Code 44 given:  Yes Patient signature on Condition Code 44 notice:  Yes Documentation of 2 MD's agreement:  Yes Code 44 added to claim:  Yes    Elliot Cousin, RN 03/20/2017, 3:46 PM

## 2017-03-20 NOTE — Progress Notes (Signed)
    Patient presented for Lexiscan nuclear stress test. Tolerated procedure well. Pending final stress imaging result.  Berton Bon, AGNP-C 03/20/2017  12:27 PM Pager: [phone]

## 2017-03-20 NOTE — Progress Notes (Signed)
  Echocardiogram 2D Echocardiogram has been performed.  Monica Stafford 03/20/2017, 10:04 AM

## 2017-03-20 NOTE — Progress Notes (Signed)
Arrived at room and nurse informs me that patient is gone down for testing/procedure.    Please notify by consult if additional support is needed for patient.    03/20/17 1150  Clinical Encounter Type  Visited With Patient not available;Health care provider

## 2017-03-20 NOTE — Progress Notes (Addendum)
New Admission Note:   Arrival Method: From [facility] via [facility], ambulated to unit bed. Mental Orientation: A&O x4 Telemetry: initiated and verified Skin: soft, brown mole looking area on right hip, peri skin is red, patient says it itches at times and it has been coming and going for 53 years. Pain: right hip 8/10 Tubes: left forearm IV, heparin running @ 76ml/hr Safety Measures: Implemented  Orders to be reviewed and implemented. Will continue to monitor the patient. Call light has been placed within reach and bed alarm has been activated.   Mar Daring, RN Phone: [phone]

## 2017-03-22 ENCOUNTER — Ambulatory Visit (HOSPITAL_COMMUNITY)
Admission: RE | Admit: 2017-03-22 | Discharge: 2017-03-22 | Disposition: A | Discharge status: Home / Self Care | Payer: Medicare HMO | Source: Ambulatory Visit | Attending: Family Medicine | Admitting: Family Medicine

## 2017-03-22 DIAGNOSIS — M5136 Other intervertebral disc degeneration, lumbar region: Secondary | ICD-10-CM | POA: Insufficient documentation

## 2017-03-22 DIAGNOSIS — M48061 Spinal stenosis, lumbar region without neurogenic claudication: Secondary | ICD-10-CM | POA: Diagnosis not present

## 2017-03-22 DIAGNOSIS — M5441 Lumbago with sciatica, right side: Secondary | ICD-10-CM | POA: Diagnosis present

## 2017-03-22 DIAGNOSIS — M5127 Other intervertebral disc displacement, lumbosacral region: Secondary | ICD-10-CM | POA: Insufficient documentation

## 2017-03-22 DIAGNOSIS — M1288 Other specific arthropathies, not elsewhere classified, other specified site: Secondary | ICD-10-CM | POA: Insufficient documentation

## 2017-04-02 ENCOUNTER — Other Ambulatory Visit (HOSPITAL_COMMUNITY): Payer: Self-pay | Admitting: Family Medicine

## 2017-04-02 DIAGNOSIS — M25551 Pain in right hip: Secondary | ICD-10-CM

## 2017-04-05 ENCOUNTER — Ambulatory Visit (HOSPITAL_COMMUNITY)
Admission: RE | Admit: 2017-04-05 | Discharge: 2017-04-05 | Disposition: A | Discharge status: Home / Self Care | Payer: Medicare HMO | Source: Ambulatory Visit | Attending: Family Medicine | Admitting: Family Medicine

## 2017-04-05 DIAGNOSIS — M1611 Unilateral primary osteoarthritis, right hip: Secondary | ICD-10-CM | POA: Diagnosis not present

## 2017-04-05 DIAGNOSIS — M25451 Effusion, right hip: Secondary | ICD-10-CM | POA: Insufficient documentation

## 2017-04-05 DIAGNOSIS — M25551 Pain in right hip: Secondary | ICD-10-CM | POA: Diagnosis present

## 2017-04-08 ENCOUNTER — Encounter: Payer: Self-pay | Admitting: Cardiology

## 2017-04-08 ENCOUNTER — Ambulatory Visit (INDEPENDENT_AMBULATORY_CARE_PROVIDER_SITE_OTHER): Payer: Medicare HMO | Admitting: Cardiology

## 2017-04-08 VITALS — BP 134/68 | HR 62 | Ht 68.0 in | Wt 169.0 lb

## 2017-04-08 DIAGNOSIS — I2510 Atherosclerotic heart disease of native coronary artery without angina pectoris: Secondary | ICD-10-CM | POA: Diagnosis not present

## 2017-04-08 DIAGNOSIS — R001 Bradycardia, unspecified: Secondary | ICD-10-CM

## 2017-04-08 DIAGNOSIS — E782 Mixed hyperlipidemia: Secondary | ICD-10-CM

## 2017-04-08 DIAGNOSIS — I10 Essential (primary) hypertension: Secondary | ICD-10-CM

## 2017-04-08 DIAGNOSIS — R55 Syncope and collapse: Secondary | ICD-10-CM | POA: Diagnosis not present

## 2017-04-08 MED ORDER — FUROSEMIDE 20 MG PO TABS: 20.0000 mg | ORAL_TABLET | Freq: Every day | ORAL | 3 refills | Status: DC | PRN
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
## 2017-04-08 NOTE — Patient Instructions (Signed)
Medication Instructions:  Your physician has recommended you make the following change in your medication:  Lasix 20 mg Daily As Needed    Labwork: NONE   Testing/Procedures: NONE   Follow-Up: Your physician wants you to follow-up in: 6 Months with Dr. Wyline MoodBranch. You will receive a reminder letter in the mail two months in advance. If you don't receive a letter, please call our office to schedule the follow-up appointment.   Any Other Special Instructions Will Be Listed Below (If Applicable).     If you need a refill on your cardiac medications before your next appointment, please call your pharmacy. Thank you for choosing [facility]!

## 2017-04-11 ENCOUNTER — Other Ambulatory Visit: Payer: Self-pay | Admitting: Family Medicine

## 2017-04-11 DIAGNOSIS — M5441 Lumbago with sciatica, right side: Secondary | ICD-10-CM

## 2017-04-17 ENCOUNTER — Encounter: Payer: Self-pay | Admitting: Family

## 2017-04-17 ENCOUNTER — Ambulatory Visit (INDEPENDENT_AMBULATORY_CARE_PROVIDER_SITE_OTHER): Payer: Medicare HMO | Admitting: Family

## 2017-04-17 ENCOUNTER — Ambulatory Visit (HOSPITAL_COMMUNITY)
Admission: RE | Admit: 2017-04-17 | Discharge: 2017-04-17 | Disposition: A | Discharge status: Home / Self Care | Payer: Medicare HMO | Source: Ambulatory Visit | Attending: Family | Admitting: Family

## 2017-04-17 VITALS — BP 136/83 | HR 56 | Temp 97.1°F | Resp 16 | Ht 68.0 in | Wt 164.0 lb

## 2017-04-17 DIAGNOSIS — I6523 Occlusion and stenosis of bilateral carotid arteries: Secondary | ICD-10-CM

## 2017-04-17 DIAGNOSIS — Z9889 Other specified postprocedural states: Secondary | ICD-10-CM | POA: Diagnosis not present

## 2017-04-17 LAB — VAS US CAROTID
LEFT ECA DIAS: 0 cm/s
LEFT VERTEBRAL DIAS: 11 cm/s
Left CCA dist dias: 0 cm/s
Left CCA dist sys: 60 cm/s
Left CCA prox dias: 11 cm/s
Left CCA prox sys: 71 cm/s
Left ICA dist dias: -23 cm/s
Left ICA dist sys: -83 cm/s
Left ICA prox dias: 13 cm/s
Left ICA prox sys: 31 cm/s
RIGHT CCA MID DIAS: 14 cm/s
RIGHT ECA DIAS: -9 cm/s
Right CCA prox dias: -9 cm/s
Right CCA prox sys: -71 cm/s
Right cca dist sys: -70 cm/s

## 2017-04-17 NOTE — Patient Instructions (Signed)
Stroke Prevention Some medical conditions and behaviors are associated with an increased chance of having a stroke. You may prevent a stroke by making healthy choices and managing medical conditions. How can I reduce my risk of having a stroke?  Stay physically active. Get at least 30 minutes of activity on most or all days.  Do not smoke. It may also be helpful to avoid exposure to secondhand smoke.  Limit alcohol use. Moderate alcohol use is considered to be: ? No more than 2 drinks per day for men. ? No more than 1 drink per day for nonpregnant women.  Eat healthy foods. This involves: ? Eating 5 or more servings of fruits and vegetables a day. ? Making dietary changes that address high blood pressure (hypertension), high cholesterol, diabetes, or obesity.  Manage your cholesterol levels. ? Making food choices that are high in fiber and low in saturated fat, trans fat, and cholesterol may control cholesterol levels. ? Take any prescribed medicines to control cholesterol as directed by your health care provider.  Manage your diabetes. ? Controlling your carbohydrate and sugar intake is recommended to manage diabetes. ? Take any prescribed medicines to control diabetes as directed by your health care provider.  Control your hypertension. ? Making food choices that are low in salt (sodium), saturated fat, trans fat, and cholesterol is recommended to manage hypertension. ? Ask your health care provider if you need treatment to lower your blood pressure. Take any prescribed medicines to control hypertension as directed by your health care provider. ? If you are 18-39 years of age, have your blood pressure checked every 3-5 years. If you are 40 years of age or older, have your blood pressure checked every year.  Maintain a healthy weight. ? Reducing calorie intake and making food choices that are low in sodium, saturated fat, trans fat, and cholesterol are recommended to manage  weight.  Stop drug abuse.  Avoid taking birth control pills. ? Talk to your health care provider about the risks of taking birth control pills if you are over 35 years old, smoke, get migraines, or have ever had a blood clot.  Get evaluated for sleep disorders (sleep apnea). ? Talk to your health care provider about getting a sleep evaluation if you snore a lot or have excessive sleepiness.  Take medicines only as directed by your health care provider. ? For some people, aspirin or blood thinners (anticoagulants) are helpful in reducing the risk of forming abnormal blood clots that can lead to stroke. If you have the irregular heart rhythm of atrial fibrillation, you should be on a blood thinner unless there is a good reason you cannot take them. ? Understand all your medicine instructions.  Make sure that other conditions (such as anemia or atherosclerosis) are addressed. Get help right away if:  You have sudden weakness or numbness of the face, arm, or leg, especially on one side of the body.  Your face or eyelid droops to one side.  You have sudden confusion.  You have trouble speaking (aphasia) or understanding.  You have sudden trouble seeing in one or both eyes.  You have sudden trouble walking.  You have dizziness.  You have a loss of balance or coordination.  You have a sudden, severe headache with no known cause.  You have new chest pain or an irregular heartbeat. Any of these symptoms may represent a serious problem that is an emergency. Do not wait to see if the symptoms will go away.   Get medical help at once. Call your local emergency services (911 in [place] Do not drive yourself to the hospital. This information is not intended to replace advice given to you by your health care provider. Make sure you discuss any questions you have with your health care provider. Document Released: 07/12/2004 Document Revised: 11/10/2015 Document Reviewed: 12/05/2012 Elsevier  Interactive Patient Education  2017 [employer] Inc.     Preventing Cerebrovascular Disease Arteries are blood vessels that carry blood that contains oxygen from the heart to all parts of the body. Cerebrovascular disease affects arteries that supply the brain. Any condition that blocks or disrupts blood flow to the brain can cause cerebrovascular disease. Brain cells that lose blood supply start to die within minutes (stroke). Stroke is the main danger of cerebrovascular disease. Atherosclerosis and high blood pressure are common causes of cerebrovascular disease. Atherosclerosis is narrowing and hardening of an artery that results when fat, cholesterol, calcium, or other substances (plaque) build up inside an artery. Plaque reduces blood flow through the artery. High blood pressure increases the risk of bleeding inside the brain. Making diet and lifestyle changes to prevent atherosclerosis and high blood pressure lowers your risk of cerebrovascular disease. What nutrition changes can be made?  Eat more fruits, vegetables, and whole grains.  Reduce how much saturated fat you eat. To do this, eat less red meat and fewer full-fat dairy products.  Eat healthy proteins instead of red meat. Healthy proteins include: ? Fish. Eat fish that contains heart-healthy omega-3 fatty acids, twice a week. Examples include salmon, albacore tuna, mackerel, and herring. ? Chicken. ? Nuts. ? Low-fat or nonfat yogurt.  Avoid processed meats, like bacon and lunchmeat.  Avoid foods that contain: ? A lot of sugar, such as sweets and drinks with added sugar. ? A lot of salt (sodium). Avoid adding extra salt to your food, as told by your health care provider. ? Trans fats, such as margarine and baked goods. Trans fats may be listed as "partially hydrogenated oils" on food labels.  Check food labels to see how much sodium, sugar, and trans fats are in foods.  Use vegetable oils that contain low amounts of  saturated fat, such as olive oil or canola oil. What lifestyle changes can be made?  Drink alcohol in moderation. This means no more than 1 drink a day for nonpregnant women and 2 drinks a day for men. One drink equals 12 oz of beer, 5 oz of wine, or 1 oz of hard liquor.  If you are overweight, ask your health care provider to recommend a weight-loss plan for you. Losing 5-10 lb (2.2-4.5 kg) can reduce your risk of diabetes, atherosclerosis, and high blood pressure.  Exercise for 30?60 minutes on most days, or as much as told by your health care provider. ? Do moderate-intensity exercise, such as brisk walking, bicycling, and water aerobics. Ask your health care provider which activities are safe for you.  Do not use any products that contain nicotine or tobacco, such as cigarettes and e-cigarettes. If you need help quitting, ask your health care provider. Why are these changes important? Making these changes lowers your risk of many diseases that can cause cerebrovascular disease and stroke. Stroke is a leading cause of death and disability. Making these changes also improves your overall health and quality of life. What can I do to lower my risk? The following factors make you more likely to develop cerebrovascular disease:  Being overweight.  Smoking.  Being physically inactive.    Eating a high-fat diet.  Having certain health conditions, such as: ? Diabetes. ? High blood pressure. ? Heart disease. ? Atherosclerosis. ? High cholesterol. ? Sickle cell disease.  Talk with your health care provider about your risk for cerebrovascular disease. Work with your health care provider to control diseases that you have that may contribute to cerebrovascular disease. Your health care provider may prescribe medicines to help prevent major causes of cerebrovascular disease. Where to find more information: Learn more about preventing cerebrovascular disease from:  National Heart, Lung, and  Blood Institute: www.nhlbi.nih.gov/health/health-topics/topics/stroke  Centers for Disease Control and Prevention: cdc.gov/stroke/about.htm  Summary  Cerebrovascular disease can lead to a stroke.  Atherosclerosis and high blood pressure are major causes of cerebrovascular disease.  Making diet and lifestyle changes can reduce your risk of cerebrovascular disease.  Work with your health care provider to get your risk factors under control to reduce your risk of cerebrovascular disease. This information is not intended to replace advice given to you by your health care provider. Make sure you discuss any questions you have with your health care provider. Document Released: 06/19/2015 Document Revised: 12/23/2015 Document Reviewed: 06/19/2015 Elsevier Interactive Patient Education  2018 Elsevier Inc.  

## 2017-04-17 NOTE — Progress Notes (Signed)
Chief Complaint: Follow up Extracranial Carotid Artery Stenosis   History of Present Illness  Monica Stafford is a 76 y.o. female who is s/p left carotid endarterectomy in 2002 by Dr. Hart Rochester. She had a carotid ultrasound at [facility] which revealed a 50-70% right ICA stenosis and she was referred for further evaluation. She has not had regular follow-up since her original surgery.  She denies any known history of stroke or TIA. Specifically she deniesa history of amaurosis fugax or monocular blindness, unilateral facial drooping, hemiplegia, orreceptive or expressive aphasia.    She had an appendectomy in August 2017. Her cardiologist is Dr. Dina Rich.  She had a CABG in 2002.   Her right hip and right leg sciatic pain is bothering her, states she will receive an ESI soon. She has had MRI of back and right hip.  Her activity is limited by right sciatic pain, but she still keeps going.   She works 2 part time jobs. Her oldest son is in a nursing home due to being hit by a car, had a closed head injury.   Pt Diabetic: no Pt smoker: non-smoker  Pt meds include: Statin : yes ASA: yes Other anticoagulants/antiplatelets: no   Past Medical History:  Diagnosis Date  . Arthritis    back  all over  . Hyperlipidemia   . Hypertension   . Peripheral vascular disease (HCC)   . Pneumonia     50 years  ago  . Sleep apnea      study but dont wear   mask    Social History Social History  Substance Use Topics  . Smoking status: Never Smoker  . Smokeless tobacco: Never Used  . Alcohol use No    Family History Family History  Problem Relation Age of Onset  . Heart disease Father        before age 59  . Heart attack Father   . Parkinson's disease Sister   . Colitis Sister     Surgical History Past Surgical History:  Procedure Laterality Date  . ABDOMINAL HYSTERECTOMY  1998   [facility]  . CARDIAC CATHETERIZATION    . CATARACT  EXTRACTION W/PHACO  12/10/2011   Procedure: CATARACT EXTRACTION PHACO AND INTRAOCULAR LENS PLACEMENT (IOC);  Surgeon: Gemma Payor, MD;  Location: [facility];  Service: Ophthalmology;  Laterality: Right;  CDE:10.40  . CATARACT EXTRACTION W/PHACO  01/07/2012   Procedure: CATARACT EXTRACTION PHACO AND INTRAOCULAR LENS PLACEMENT (IOC);  Surgeon: Gemma Payor, MD;  Location: [facility];  Service: Ophthalmology;  Laterality: Left;  CDE 8.38  . CORONARY ARTERY BYPASS GRAFT  12/23/2000   [facility]  . LAPAROSCOPIC APPENDECTOMY N/A 02/27/2016   Procedure: APPENDECTOMY LAPAROSCOPIC;  Surgeon: Franky Macho, MD;  Location: [facility];  Service: General;  Laterality: N/A;    Allergies  Allergen Reactions  . Sulfonamide Derivatives Itching    Current Outpatient Prescriptions  Medication Sig Dispense Refill  . acetaminophen (TYLENOL) 500 MG tablet Take 1,000 mg by mouth 3 (three) times daily as needed for mild pain or moderate pain.    Marland Kitchen alendronate (FOSAMAX) 70 MG tablet Take 70 mg by mouth every 7 (seven) days. Take with a full glass of water on an empty stomach. Usually on Monday    . ALPRAZolam (XANAX) 1 MG tablet Take 0.25-1 mg by mouth 3 (three) times daily as needed for anxiety.     Marland Kitchen amLODipine (NORVASC) 5 MG tablet Take 1 tablet (5 mg total)  by mouth daily. 90 tablet 3  . aspirin EC 81 MG tablet Take 81 mg by mouth daily.    Marland Kitchen atorvastatin (LIPITOR) 80 MG tablet Take 0.5 tablets (40 mg total) by mouth daily. 90 tablet 3  . cholecalciferol (VITAMIN D) 1000 UNITS tablet Take 2,000-3,000 Units by mouth 2 (two) times daily. 3 CAPSULES IN THE MORNING AND 2 CAPSULES AT BEDTIME    . estradiol (ESTRACE) 0.1 MG/GM vaginal cream Place 1 g vaginally 3 (three) times a week.     . fish oil-omega-3 fatty acids 1000 MG capsule Take 1 g by mouth 2 (two) times daily.     . furosemide (LASIX) 20 MG tablet Take 1 tablet (20 mg total) by mouth daily as needed. (Patient taking differently: Take 10 mg by mouth daily as needed. ) 90  tablet 3  . HYDROcodone-acetaminophen (NORCO/VICODIN) 5-325 MG tablet Take 1 tablet by mouth every 6 (six) hours as needed for moderate pain or severe pain.     Marland Kitchen lisinopril (PRINIVIL,ZESTRIL) 20 MG tablet Take 20 mg by mouth 2 (two) times daily.    . Multiple Vitamin (MULTIVITAMIN WITH MINERALS) TABS Take 1 tablet by mouth daily.    . nitroGLYCERIN (NITROSTAT) 0.4 MG SL tablet Place 1 tablet (0.4 mg total) under the tongue every 5 (five) minutes as needed for chest pain. 25 tablet 1  . potassium chloride (MICRO-K) 10 MEQ CR capsule Take 10 mEq by mouth 2 (two) times daily.    . raloxifene (EVISTA) 60 MG tablet Take 60 mg by mouth every other day.     . triamterene-hydrochlorothiazide (MAXZIDE) 75-50 MG per tablet Take 0.5 tablets by mouth every other day.    . Calcium Carbonate-Vitamin D (CALTRATE 600+D) 600-400 MG-UNIT per tablet Take 1 tablet by mouth 3 (three) times daily.     . ciprofloxacin (CIPRO) 500 MG tablet     . KLOR-CON M20 20 MEQ tablet     . metoprolol tartrate (LOPRESSOR) 25 MG tablet Take 1 tablet (25 mg total) by mouth 2 (two) times daily. 180 tablet 3  . predniSONE (DELTASONE) 10 MG tablet      No current facility-administered medications for this visit.     Review of Systems : See HPI for pertinent positives and negatives.  Physical Examination  Vitals:   04/17/17 1142 04/17/17 1146  BP: 114/75 136/83  Pulse: (!) 56 (!) 56  Resp: 16   Temp: (!) 97.1 F (36.2 C)   SpO2: 94%   Weight: 164 lb (74.4 kg)   Height: 5\' 8"  (1.727 m)    Body mass index is 24.94 kg/m.  General: WDWN female in NAD GAIT: normal Eyes: PERRLA Pulmonary:  Respirations are non-labored, good air movement, CTAB.   Cardiac: regular rhythm, no detected murmur.  VASCULAR EXAM Carotid Bruits Right Left   Negative Negative     Abdominal aortic pulse is not palpable. Radial pulses are 2+ palpable and equal.  LE Pulses Right Left       POPLITEAL  not palpable   not palpable       POSTERIOR TIBIAL  faintly palpable   1+ palpable        DORSALIS PEDIS      ANTERIOR TIBIAL  not palpable   faintly palpable     Gastrointestinal: soft, nontender, BS WNL, no r/g, no palpable masses.  Musculoskeletal: No muscle atrophy/wasting. M/S 5/5 throughout, extremities without ischemic changes.  Neurologic: A&O X 3; appropriate affect, speech is normal, CN 2-12 intact, pain and light touch intact in extremities, Motor exam as listed above     Assessment: Monica Stafford is a 76 y.o. female who is s/p left carotid endarterectomy in 2002. She has no history of stroke or TIA.  DATA Carotid Duplex (04/17/17): Right ICA: 1-39% stenosis. Left ICA: CEA site with no restenosis.  Bilateral vertebral arteries are antegrade. Bilateral subclavian arteries are triphasic. No significant change compared to the exam on 04-10-16.   Plan:  Follow-up in 18 months with Carotid Duplex scan.   I discussed in depth with the patient the nature of atherosclerosis, and emphasized the importance of maximal medical management including strict control of blood pressure, blood glucose, and lipid levels, obtaining regular exercise, and continued cessation of smoking.  The patient is aware that without maximal medical management the underlying atherosclerotic disease process will progress, limiting the benefit of any interventions. The patient was given information about stroke prevention and what symptoms should prompt the patient to seek immediate medical care. Thank you for allowing us to participate in this patient's care.  Charisse MarchSuzanne Tatem Fesler, RN, MSN, FNP-C [facility] [place] Office: [phone]  Clinic Physician: Chen/Early  04/17/17 12:06 PM

## 2017-04-22 ENCOUNTER — Ambulatory Visit
Admission: RE | Admit: 2017-04-22 | Discharge: 2017-04-22 | Disposition: A | Discharge status: Home / Self Care | Payer: Medicare HMO | Source: Ambulatory Visit | Attending: Family Medicine | Admitting: Family Medicine

## 2017-04-22 DIAGNOSIS — M5441 Lumbago with sciatica, right side: Secondary | ICD-10-CM

## 2017-04-22 MED ORDER — METHYLPREDNISOLONE ACETATE 40 MG/ML INJ SUSP (RADIOLOG
120.0000 mg | Freq: Once | INTRAMUSCULAR | Status: AC
  Administered 2017-04-22: 120 mg via EPIDURAL

## 2017-04-22 MED ORDER — IOPAMIDOL (ISOVUE-M 200) INJECTION 41%
1.0000 mL | Freq: Once | INTRAMUSCULAR | Status: AC
  Administered 2017-04-22: 1 mL via EPIDURAL

## 2017-04-22 NOTE — Discharge Instructions (Signed)

## 2017-06-25 ENCOUNTER — Other Ambulatory Visit (HOSPITAL_COMMUNITY): Payer: Self-pay | Admitting: Family Medicine

## 2017-06-25 DIAGNOSIS — Z1231 Encounter for screening mammogram for malignant neoplasm of breast: Secondary | ICD-10-CM

## 2017-07-08 ENCOUNTER — Ambulatory Visit (HOSPITAL_COMMUNITY)
Admission: RE | Admit: 2017-07-08 | Discharge: 2017-07-08 | Disposition: A | Discharge status: Home / Self Care | Payer: Medicare HMO | Source: Ambulatory Visit | Attending: Family Medicine | Admitting: Family Medicine

## 2017-07-08 DIAGNOSIS — Z1231 Encounter for screening mammogram for malignant neoplasm of breast: Secondary | ICD-10-CM | POA: Diagnosis present

## 2017-08-05 ENCOUNTER — Other Ambulatory Visit: Payer: Self-pay | Admitting: Family Medicine

## 2017-08-05 DIAGNOSIS — M5441 Lumbago with sciatica, right side: Secondary | ICD-10-CM

## 2017-08-12 ENCOUNTER — Ambulatory Visit
Admission: RE | Admit: 2017-08-12 | Discharge: 2017-08-12 | Disposition: A | Discharge status: Home / Self Care | Payer: Medicare HMO | Source: Ambulatory Visit | Attending: Family Medicine | Admitting: Family Medicine

## 2017-08-12 DIAGNOSIS — M5441 Lumbago with sciatica, right side: Secondary | ICD-10-CM

## 2017-08-12 MED ORDER — IOPAMIDOL (ISOVUE-M 200) INJECTION 41%
1.0000 mL | Freq: Once | INTRAMUSCULAR | Status: AC
  Administered 2017-08-12: 1 mL via EPIDURAL

## 2017-08-12 MED ORDER — METHYLPREDNISOLONE ACETATE 40 MG/ML INJ SUSP (RADIOLOG
120.0000 mg | Freq: Once | INTRAMUSCULAR | Status: AC
  Administered 2017-08-12: 120 mg via EPIDURAL

## 2017-08-12 NOTE — Discharge Instructions (Signed)

## 2017-10-29 ENCOUNTER — Ambulatory Visit: Payer: Medicare HMO | Admitting: Cardiology

## 2017-10-29 ENCOUNTER — Encounter: Payer: Self-pay | Admitting: Cardiology

## 2017-10-29 VITALS — BP 122/58 | HR 48 | Ht 68.0 in | Wt 173.0 lb

## 2017-10-29 DIAGNOSIS — I2510 Atherosclerotic heart disease of native coronary artery without angina pectoris: Secondary | ICD-10-CM

## 2017-10-29 DIAGNOSIS — E782 Mixed hyperlipidemia: Secondary | ICD-10-CM

## 2017-10-29 DIAGNOSIS — I10 Essential (primary) hypertension: Secondary | ICD-10-CM

## 2017-10-29 DIAGNOSIS — R001 Bradycardia, unspecified: Secondary | ICD-10-CM | POA: Diagnosis not present

## 2017-10-29 DIAGNOSIS — I6523 Occlusion and stenosis of bilateral carotid arteries: Secondary | ICD-10-CM | POA: Diagnosis not present

## 2017-10-29 MED ORDER — METOPROLOL TARTRATE 25 MG PO TABS: 12.5000 mg | ORAL_TABLET | Freq: Two times a day (BID) | ORAL | 3 refills | Status: DC

## 2017-10-29 NOTE — Patient Instructions (Signed)
Medication Instructions:  Decrease metoprolol to 12.5 mg - two times daily   Labwork: I will request labs form pcp  Testing/Procedures: none  Follow-Up: Your physician wants you to follow-up in: 6 months.  You will receive a reminder letter in the mail two months in advance. If you don't receive a letter, please call our office to schedule the follow-up appointment.   Any Other Special Instructions Will Be Listed Below (If Applicable).     If you need a refill on your cardiac medications before your next appointment, please call your pharmacy.
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
## 2017-11-03 ENCOUNTER — Encounter: Payer: Self-pay | Admitting: Cardiology

## 2018-02-22 ENCOUNTER — Other Ambulatory Visit: Payer: Self-pay | Admitting: Cardiology

## 2018-04-26 ENCOUNTER — Other Ambulatory Visit: Payer: Self-pay | Admitting: Cardiology

## 2018-04-28 ENCOUNTER — Encounter: Payer: Self-pay | Admitting: Cardiology

## 2018-04-28 ENCOUNTER — Ambulatory Visit: Payer: Medicare HMO | Admitting: Cardiology

## 2018-04-28 VITALS — BP 160/78 | HR 59 | Ht 68.0 in | Wt 178.0 lb

## 2018-04-28 DIAGNOSIS — I10 Essential (primary) hypertension: Secondary | ICD-10-CM

## 2018-04-28 DIAGNOSIS — I2510 Atherosclerotic heart disease of native coronary artery without angina pectoris: Secondary | ICD-10-CM

## 2018-04-28 DIAGNOSIS — I6523 Occlusion and stenosis of bilateral carotid arteries: Secondary | ICD-10-CM | POA: Diagnosis not present

## 2018-04-28 DIAGNOSIS — E782 Mixed hyperlipidemia: Secondary | ICD-10-CM | POA: Diagnosis not present

## 2018-04-28 NOTE — Progress Notes (Signed)
Clinical Summary Ms. Driskill is a 77 y.o.female seen today  for the following medical problems  1. CAD - history of CABG in 2002 (LIMA-LAD,RIMA-RCA,SVG-OM1,SVG-D2). LVEF 69% by LVgram in 2002 - 03/2017 admit with chest pain. Troponins negative. Nuclear stress without ischemia, low risk - 03/2017 echo LVEF 60-65%, grade I diastolic dysfunction  - mild chest pain few nights last week. Aching pain 2/10 in severity, no other associated symptoms. Would last few minutes. Tried NG with some benefit, but also better with drinking and belching.  - no exertional symptoms.  - compliant with meds  2. Carotid stenosis - history of left CEA 12/2000 - 02/2015 right 50-70% at right carotid bulb bifurcation, no significant disease on left.  - vascular f/u planned for spring 2020  3. Hyperlipidemia -03/2017 TC 100 TG 74 HDL 42 LDL 43 -Decreased atorva to 40mg  daily due to mildly elevated LFTs.  - 09/2017 TC 117 HDL 44 LDL 54 TG 103  4.HTN - compliant with meds   5. Sinus bradycardia -chronic, asymptomatic.  - we lowered lopressor to 12.5mg  bid previously No symptoms.    6. LE edema - started after norvasc was increased to 10mg  daily back in 10/2015 - norvasc was decreased to 5mg  daily which improved swelling - mild chronic edema. Previously a fairly benign echo, suspect likely component of venous insufficiency - overall stable, not bothersome.     7. Syncope - presented 03/2017 with chest pain and syncope - syncope episode occurred after becoming nauseated - labs suggested she was dehydrated. She had also taken a nitroglycerin while standing around that time.  - no recent issues   SH:  Works at [employer], works Diplomatic Services operational officer at [employer].   Past Medical History:  Diagnosis Date  . Arthritis    back  all over  . Hyperlipidemia   . Hypertension   . Peripheral vascular disease (HCC)   . Pneumonia     50 years  ago  . Sleep apnea      study but dont wear   mask       Allergies  Allergen Reactions  . Sulfonamide Derivatives Itching     Current Outpatient Medications  Medication Sig Dispense Refill  . acetaminophen (TYLENOL) 500 MG tablet Take 1,000 mg by mouth 3 (three) times daily as needed for mild pain or moderate pain.    Marland Kitchen alendronate (FOSAMAX) 70 MG tablet Take 70 mg by mouth every 7 (seven) days. Take with a full glass of water on an empty stomach. Usually on Monday    . ALPRAZolam (XANAX) 1 MG tablet Take 0.25-1 mg by mouth 3 (three) times daily as needed for anxiety.     Marland Kitchen amLODipine (NORVASC) 5 MG tablet TAKE 1 TABLET BY MOUTH ONCE DAILY 90 tablet 3  . aspirin EC 81 MG tablet Take 81 mg by mouth daily.    Marland Kitchen atorvastatin (LIPITOR) 80 MG tablet Take 0.5 tablets (40 mg total) by mouth daily. 90 tablet 3  . Calcium Carbonate-Vitamin D (CALTRATE 600+D) 600-400 MG-UNIT per tablet Take 1 tablet by mouth 3 (three) times daily.     . cholecalciferol (VITAMIN D) 1000 UNITS tablet Take 2,000-3,000 Units by mouth 2 (two) times daily. 3 CAPSULES IN THE MORNING AND 2 CAPSULES AT BEDTIME    . ciprofloxacin (CIPRO) 500 MG tablet     . estradiol (ESTRACE) 0.1 MG/GM vaginal cream Place 1 g vaginally 3 (three) times a week.     . fish oil-omega-3 fatty acids  1000 MG capsule Take 1 g by mouth 2 (two) times daily.     . furosemide (LASIX) 20 MG tablet Take 1 tablet (20 mg total) by mouth daily as needed. (Patient taking differently: Take 10 mg by mouth daily as needed. ) 90 tablet 3  . KLOR-CON M20 20 MEQ tablet     . lisinopril (PRINIVIL,ZESTRIL) 20 MG tablet Take 20 mg by mouth 2 (two) times daily.    . metoprolol tartrate (LOPRESSOR) 25 MG tablet Take 0.5 tablets (12.5 mg total) by mouth 2 (two) times daily. 90 tablet 3  . Multiple Vitamin (MULTIVITAMIN WITH MINERALS) TABS Take 1 tablet by mouth daily.    . nitroGLYCERIN (NITROSTAT) 0.4 MG SL tablet Place 1 tablet (0.4 mg total) under the tongue every 5 (five) minutes as needed for chest pain. 25 tablet 1   . potassium chloride (MICRO-K) 10 MEQ CR capsule Take 10 mEq by mouth 2 (two) times daily.    . raloxifene (EVISTA) 60 MG tablet Take 60 mg by mouth every other day.     . triamterene-hydrochlorothiazide (MAXZIDE) 75-50 MG per tablet Take 0.5 tablets by mouth every other day.     No current facility-administered medications for this visit.      Past Surgical History:  Procedure Laterality Date  . ABDOMINAL HYSTERECTOMY  1998   [facility]  . CARDIAC CATHETERIZATION    . CATARACT EXTRACTION W/PHACO  12/10/2011   Procedure: CATARACT EXTRACTION PHACO AND INTRAOCULAR LENS PLACEMENT (IOC);  Surgeon: Gemma Payor, MD;  Location: [facility];  Service: Ophthalmology;  Laterality: Right;  CDE:10.40  . CATARACT EXTRACTION W/PHACO  01/07/2012   Procedure: CATARACT EXTRACTION PHACO AND INTRAOCULAR LENS PLACEMENT (IOC);  Surgeon: Gemma Payor, MD;  Location: [facility];  Service: Ophthalmology;  Laterality: Left;  CDE 8.38  . CORONARY ARTERY BYPASS GRAFT  12/23/2000   [facility]  . LAPAROSCOPIC APPENDECTOMY N/A 02/27/2016   Procedure: APPENDECTOMY LAPAROSCOPIC;  Surgeon: Franky Macho, MD;  Location: [facility];  Service: General;  Laterality: N/A;     Allergies  Allergen Reactions  . Sulfonamide Derivatives Itching      Family History  Problem Relation Age of Onset  . Heart disease Father        before age 5  . Heart attack Father   . Parkinson's disease Sister   . Colitis Sister      Social History Ms. Redinger reports that she has never smoked. She has never used smokeless tobacco. Ms. Hauschild reports that she does not drink alcohol.   Review of Systems CONSTITUTIONAL: No weight loss, fever, chills, weakness or fatigue.  HEENT: Eyes: No visual loss, blurred vision, double vision or yellow sclerae.No hearing loss, sneezing, congestion, runny nose or sore throat.  SKIN: No rash or itching.  CARDIOVASCULAR: per hpi RESPIRATORY: No shortness of breath, cough or sputum.   GASTROINTESTINAL: No anorexia, nausea, vomiting or diarrhea. No abdominal pain or blood.  GENITOURINARY: No burning on urination, no polyuria NEUROLOGICAL: No headache, dizziness, syncope, paralysis, ataxia, numbness or tingling in the extremities. No change in bowel or bladder control.  MUSCULOSKELETAL: No muscle, back pain, joint pain or stiffness.  LYMPHATICS: No enlarged nodes. No history of splenectomy.  PSYCHIATRIC: No history of depression or anxiety.  ENDOCRINOLOGIC: No reports of sweating, cold or heat intolerance. No polyuria or polydipsia.  Marland Kitchen   Physical Examination Vitals:   04/28/18 1034  BP: (!) 160/78  Pulse: (!) 59  SpO2: 95%   Vitals:  04/28/18 1034  Weight: 178 lb (80.7 kg)  Height: 5\' 8"  (1.727 m)    Gen: resting comfortably, no acute distress HEENT: no scleral icterus, pupils equal round and reactive, no palptable cervical adenopathy,  CV: RRR, no m/r/g, no vjd Resp: Clear to auscultation bilaterally GI: abdomen is soft, non-tender, non-distended, normal bowel sounds, no hepatosplenomegaly MSK: extremities are warm, 1+ bilateral pitting LE edema.  Skin: warm, no rash Neuro:  no focal deficits Psych: appropriate affect   Diagnostic Studies 12/2000 Cath PROCEDURES PERFORMED: 1. Left heart catheterization. 2. Selective coronary arteriography. 3. Selective left ventriculography. 4. Subclavian angiography.  DESCRIPTION OF PROCEDURE: The procedure was performed from the right femoral artery using 6-French catheters. She tolerated the procedure well without complications. She did have elevated blood pressures. She was given two doses of labetalol 20 mg IV, plus an additional dose of 5 mg of intravenous metoprolol. There were no complications from the procedure and she was taken to the holding area in satisfactory clinical condition.  HEMODYNAMIC DATA: Central aortic pressure was 190/83. LV pressure was 190/20. There was no gradient on pullback  across the aortic valve.  ANGIOGRAPHIC DATA: 1. Left ventriculography was performed in the RAO projection. Overall  ventricular systolic function was normal. No segmental abnormalities  or contraction were identified. There did not appear to be significant  mitral regurgitation. Ejection fraction was calculated at 69%. 2. Subclavian angiography revealed a patent subclavian artery, as well as  an internal mammary artery. The internal mammary appeared to be suitable  for grafting. 3. The left main coronary artery was free of critical disease. 4. The left anterior descending artery coursed to the apex where it wrapped  the apical tip. The LAD had about a 50% area of narrowing beyond the  origin of the first diagonal. The first diagonal itself had about 50%  narrowing. Distal to this, there was a segmental area of plaquing leading  into an approximate 90% stenosis just after the large anterolateral R[place]ford Alarie.  This anterolateral Klye Besecker itself was a large caliber vessel that had about  80% proximal narrowing. Both vessels appeared to be suitable for grafting. 5. The circumflex artery had mild plaquing of approximately 20% to 30% prior  to the first marginal. The first marginal itself had about 50% ostial and  then a mid 60% stenosis. This vessel also appeared to be suitable for  grafting. 6. The right coronary artery was a large dominant vessel providing a moderate  sized posterior descending and a very large posterolateral Nikita Humble. The  right coronary artery had a 20% to 30% narrowing in the proximal vessel.  In the mid vessel was a focal 90% stenosis.  CONCLUSIONS: 1. Preserved left ventricular function. 2. Patent internal mammary artery. 3. 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 4. Moderate stenosis of the circumflex artery. 5. High-grade stenosis of the mid right coronary artery that is suitable  for  intervention.  Cath report 2002 recs: The patient has three-vessel disease. The right is suitable for intervention, but the LAD is a complex lesion with a high risk of target vessel revascularization characterized by a long lesion and small caliber vessel. Based on these risk factors and the multivessel disease, we likely will recommend revascularization surgery. I will notify her primary care physician and we will get a surgical consultation.DD: 12/20/00 TD: 12/20/00 Job: [id number]     03/2017 Nuclear stress IMPRESSION: 1. Tiny fixed perfusion defect at the apex.  2. Normal left ventricular wall motion.  3. Left ventricular ejection fraction 64%  4. Non invasive risk stratification*: Low risk.  03/2017 echo Study Conclusions  - Left ventricle: The cavity size was normal. Wall thickness was increased in a pattern of mild LVH. Systolic function was normal. The estimated ejection fraction was in the range of 60% to 65%. Chordal SAM noted. Wall motion was normal; there were no regional wall motion abnormalities. Doppler parameters are consistent with abnormal left ventricular relaxation (grade 1 diastolic dysfunction). - Aortic valve: There was no stenosis. - Mitral valve: Mildly calcified annulus. - Right ventricle: The cavity size was normal. Systolic function was normal. - Tricuspid valve: Peak RV-RA gradient (S): 20 mm Hg. - Pulmonary arteries: PA peak pressure: 23 mm Hg (S). - Inferior vena cava: The vessel was normal in size. The respirophasic diameter changes were in the normal range (>= 50%), consistent with normal central venous pressure.  Impressions:  - Normal LV size with mild LV hypertrophy. EF 60-65%. Normal RV size and systolic function. No significant valvular abnormalities.     Assessment and Plan  1. CAD -recent nonexertioanl atypical symptoms, suspect likely GI etiology. Continue to monitor at this  time, continue current meds  2. Carotid stenosis - f/u with vascular - continue medical therapy.   3. Hyperlipidemia - at goal, continue statin. Dosing limited due to mildly elevated LFTs   4. HTN - manaul recheck 130/85, cotninue current meds  F/u 6 months.       Antoine Poche, M.D.

## 2018-04-28 NOTE — Patient Instructions (Signed)
Medication Instructions:  Your physician recommends that you continue on your current medications as directed. Please refer to the Current Medication list given to you today.  If you need a refill on your cardiac medications before your next appointment, please call your pharmacy.   Lab work: None If you have labs (blood work) drawn today and your tests are completely normal, you will receive your results only by: . MyChart Message (if you have MyChart) OR . A paper copy in the mail If you have any lab test that is abnormal or we need to change your treatment, we will call you to review the results.  Testing/Procedures: None  Follow-Up: At [facility], you and your health needs are our priority.  As part of our continuing mission to provide you with exceptional heart care, we have created designated Provider Care Teams.  These Care Teams include your primary Cardiologist (physician) and Advanced Practice Providers (APPs -  Physician Assistants and Nurse Practitioners) who all work together to provide you with the care you need, when you need it. You will need a follow up appointment in 6 months.  Please call our office 2 months in advance to schedule this appointment.  You may see Branch, Jonathan, MD or one of the following Advanced Practice Providers on your designated Care Team:   Brittany Strader, PA-C ([place] Office) . Michele Lenze, PA-C ([place] Office)  Any Other Special Instructions Will Be Listed Below (If Applicable). None   

## 2018-05-01 ENCOUNTER — Other Ambulatory Visit: Payer: Self-pay | Admitting: Cardiology

## 2018-05-02 ENCOUNTER — Other Ambulatory Visit: Payer: Self-pay

## 2018-05-02 MED ORDER — METOPROLOL TARTRATE 25 MG PO TABS: 12.5000 mg | ORAL_TABLET | Freq: Two times a day (BID) | ORAL | 3 refills | Status: DC

## 2018-07-17 ENCOUNTER — Other Ambulatory Visit (HOSPITAL_COMMUNITY): Payer: Self-pay | Admitting: Family Medicine

## 2018-07-17 DIAGNOSIS — Z1231 Encounter for screening mammogram for malignant neoplasm of breast: Secondary | ICD-10-CM

## 2018-07-24 ENCOUNTER — Encounter (HOSPITAL_COMMUNITY): Payer: Self-pay

## 2018-07-24 ENCOUNTER — Ambulatory Visit (HOSPITAL_COMMUNITY)
Admission: RE | Admit: 2018-07-24 | Discharge: 2018-07-24 | Disposition: A | Discharge status: Home / Self Care | Payer: Medicare HMO | Source: Ambulatory Visit | Attending: Family Medicine | Admitting: Family Medicine

## 2018-07-24 DIAGNOSIS — Z1231 Encounter for screening mammogram for malignant neoplasm of breast: Secondary | ICD-10-CM | POA: Diagnosis present

## 2018-08-04 ENCOUNTER — Other Ambulatory Visit (HOSPITAL_COMMUNITY): Payer: Self-pay | Admitting: Family Medicine

## 2018-08-04 DIAGNOSIS — Z780 Asymptomatic menopausal state: Secondary | ICD-10-CM

## 2018-08-11 ENCOUNTER — Ambulatory Visit (HOSPITAL_COMMUNITY)
Admission: RE | Admit: 2018-08-11 | Discharge: 2018-08-11 | Disposition: A | Discharge status: Home / Self Care | Payer: Medicare HMO | Source: Ambulatory Visit | Attending: Family Medicine | Admitting: Family Medicine

## 2018-08-11 DIAGNOSIS — Z780 Asymptomatic menopausal state: Secondary | ICD-10-CM | POA: Diagnosis present

## 2018-10-13 ENCOUNTER — Ambulatory Visit: Payer: Medicare HMO | Admitting: Family

## 2018-10-13 ENCOUNTER — Encounter (HOSPITAL_COMMUNITY): Payer: Medicare HMO

## 2018-10-31 IMAGING — MG 2D DIGITAL SCREENING BILATERAL MAMMOGRAM WITH CAD AND ADJUNCT TO
6 of 10 series · 6 of 26 positions shown · non-contrast
Comparison: Previous exam(s).

CLINICAL DATA: Screening.

EXAM:
2D DIGITAL SCREENING BILATERAL MAMMOGRAM WITH CAD AND ADJUNCT TOMO

[R CC (1 of 2)]
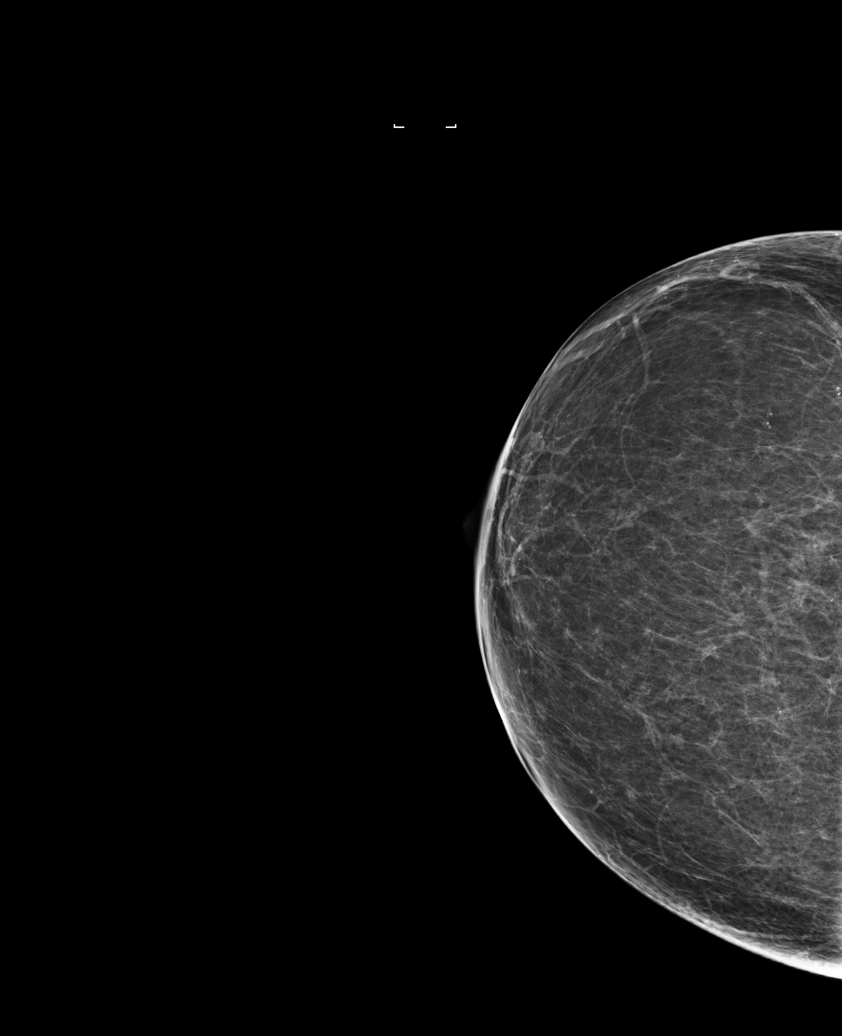

[L MLO (1 of 2)]
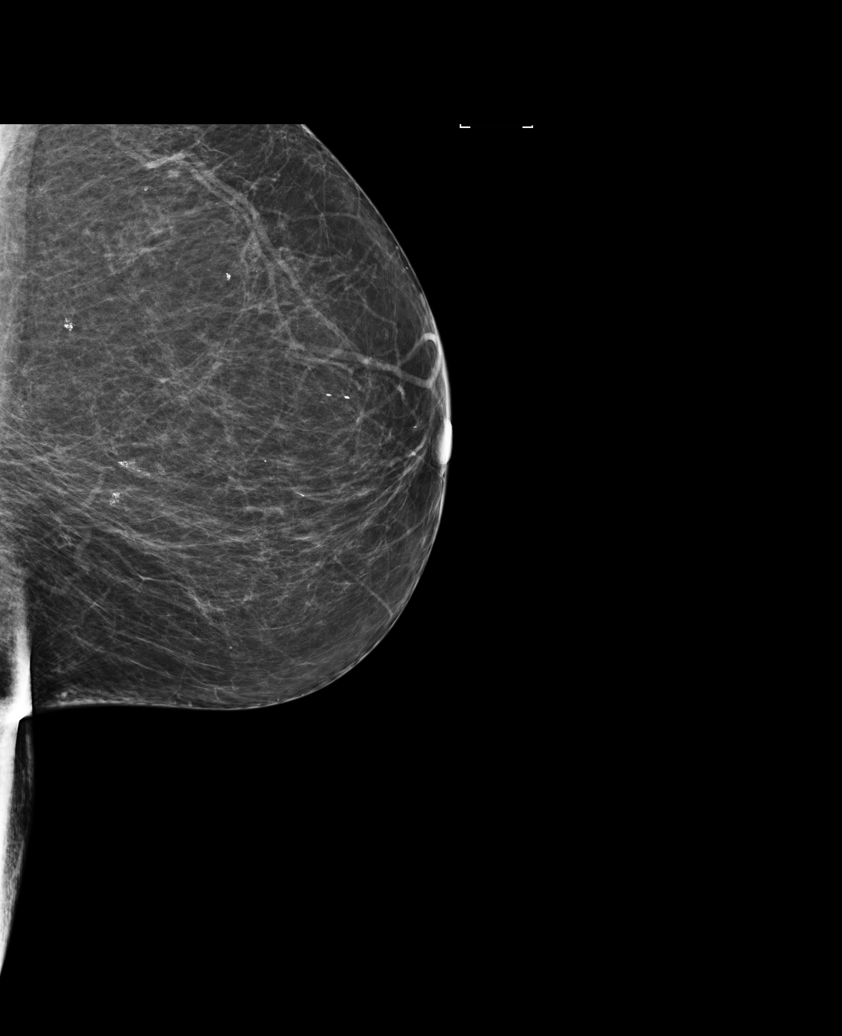

[L MLO (2 of 2)]
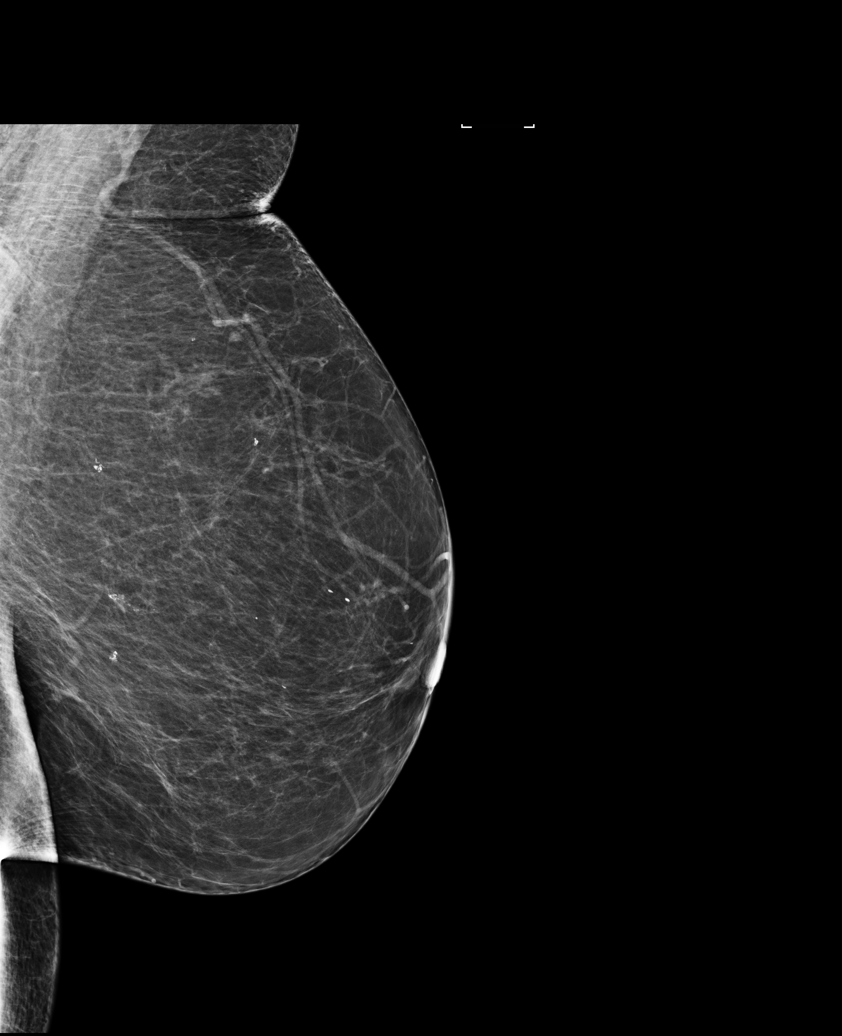

[R MLO]
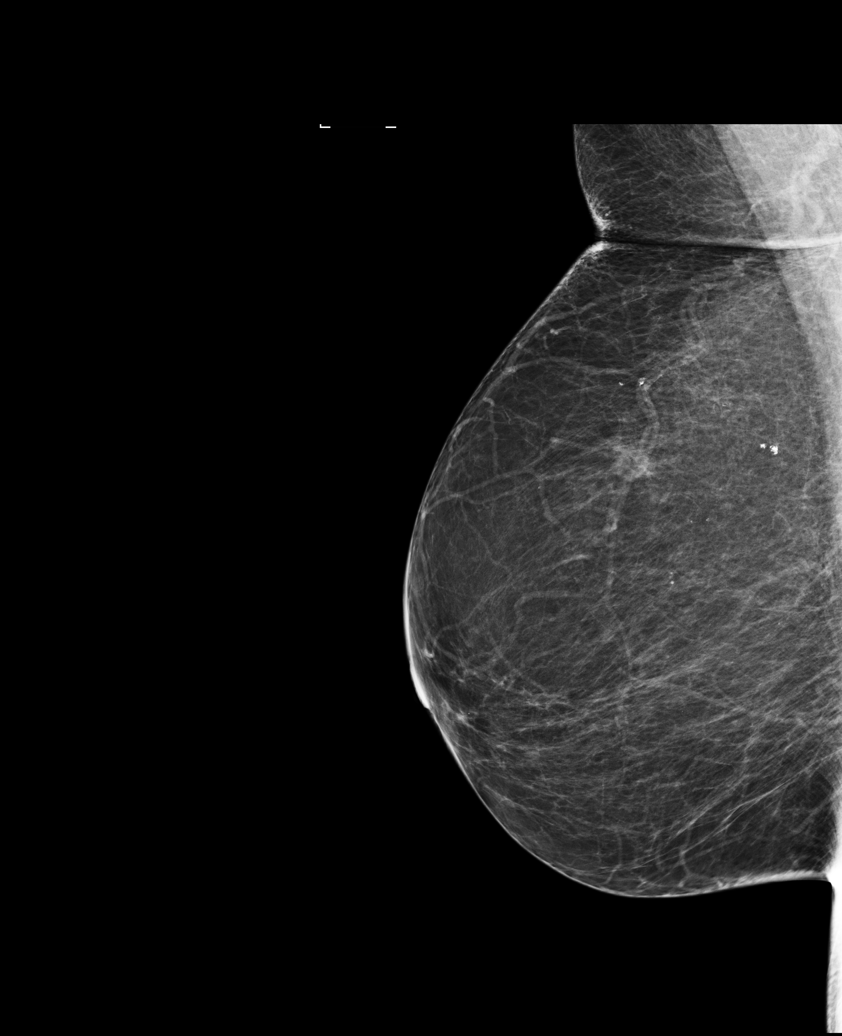

[L CC]
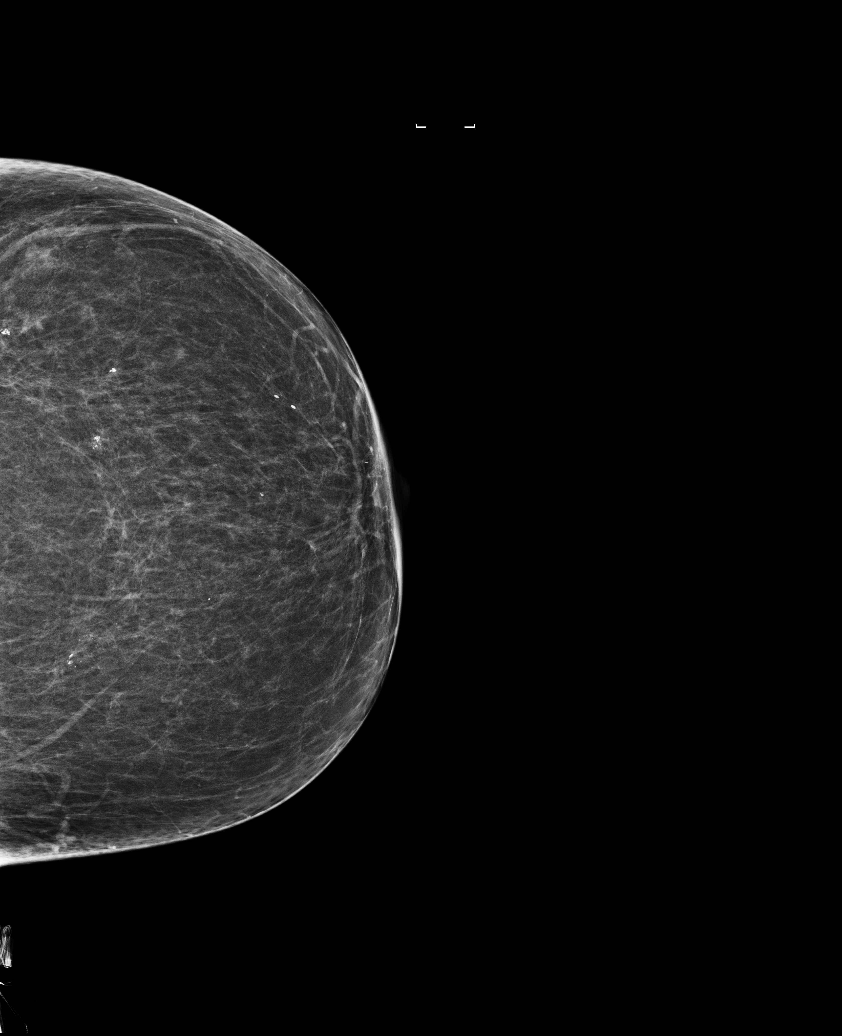

[R CC (2 of 2)]
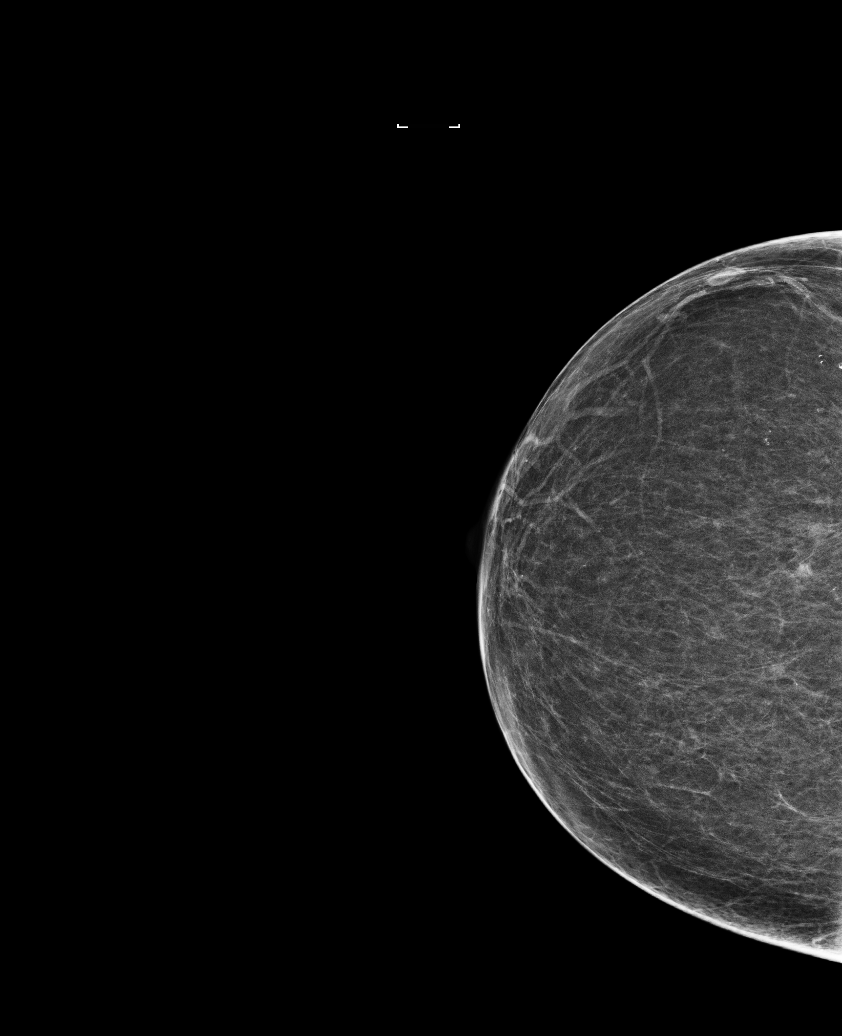

[6 of 26 positions shown; findings below may reference images not displayed]

ACR Breast Density Category b: There are scattered areas of
fibroglandular density.
FINDINGS: There are no findings suspicious for malignancy. Images were
processed with CAD.
IMPRESSION: No mammographic evidence of malignancy. A result letter of this
screening mammogram will be mailed directly to the patient.

RECOMMENDATION:
Screening mammogram in one year. (Code:97-6-RS4)

BI-RADS CATEGORY  1: Negative.

## 2018-11-17 ENCOUNTER — Other Ambulatory Visit: Payer: Self-pay

## 2018-11-17 ENCOUNTER — Telehealth (INDEPENDENT_AMBULATORY_CARE_PROVIDER_SITE_OTHER): Payer: Medicare HMO | Admitting: Cardiology

## 2018-11-17 VITALS — BP 154/90 | Ht 68.0 in | Wt 180.2 lb

## 2018-11-17 DIAGNOSIS — I2510 Atherosclerotic heart disease of native coronary artery without angina pectoris: Secondary | ICD-10-CM

## 2018-11-17 DIAGNOSIS — I6523 Occlusion and stenosis of bilateral carotid arteries: Secondary | ICD-10-CM

## 2018-11-17 DIAGNOSIS — E782 Mixed hyperlipidemia: Secondary | ICD-10-CM

## 2018-11-17 DIAGNOSIS — I10 Essential (primary) hypertension: Secondary | ICD-10-CM

## 2018-11-17 NOTE — Patient Instructions (Signed)
Medication Instructions:  Your physician recommends that you continue on your current medications as directed. Please refer to the Current Medication list given to you today.   Labwork: I will request labs from your PCP   Testing/Procedures: NONE  Follow-Up: Your physician wants you to follow-up in: 6 Months with Dr. Wyline Mood. You will receive a reminder letter in the mail two months in advance. If you don't receive a letter, please call our office to schedule the follow-up appointment.   Any Other Special Instructions Will Be Listed Below (If Applicable).  Your physician has requested that you regularly monitor and record your blood pressure readings at home. Please use the same machine at the same time of day to check your readings and record them to call and report on Friday.     If you need a refill on your cardiac medications before your next appointment, please call your pharmacy.  Thank you for choosing [facility]!

## 2018-11-17 NOTE — Progress Notes (Signed)
Virtual Visit via Telephone Note   This visit type was conducted due to national recommendations for restrictions regarding the COVID-19 Pandemic (e.g. social distancing) in an effort to limit this patient's exposure and mitigate transmission in our community.  Due to her co-morbid illnesses, this patient is at least at moderate risk for complications without adequate follow up.  This format is felt to be most appropriate for this patient at this time.  The patient did not have access to video technology/had technical difficulties with video requiring transitioning to audio format only (telephone).  All issues noted in this document were discussed and addressed.  No physical exam could be performed with this format.  Please refer to the patient's chart for her  consent to telehealth for [facility].   Date:  11/17/2018   ID:  Monica Stafford, DOB [birth year], MRN [id number]  Patient Location: Home Provider Location: Home  PCP:  Gareth Morgan, MD  Cardiologist:  Dina Rich, MD  Electrophysiologist:  None   Evaluation Performed:  Follow-Up Visit  Chief Complaint:  6 month follow up  History of Present Illness:    Monica Stafford is a 78 y.o. female  seen today  for the following medical problems   1. CAD - history of CABG in 2002 (LIMA-LAD,RIMA-RCA,SVG-OM1,SVG-D2). LVEF 69% by LVgram in 2002 - 03/2017 admit with chest pain. Troponins negative. Nuclear stress without ischemia, low risk - 03/2017 echo LVEF 60-65%, grade I diastolic dysfunction   - no recent chest pain. No SOB or DOE - compliant with meds   2. Carotid stenosis - history of left CEA 12/2000 - 02/2015 right 50-70% at right carotid bulb bifurcation, no significant disease on left.  - vascular f/u planned for spring 2020  - had to reschedule her vascular appt due to COVID-19  3. Hyperlipidemia -03/2017 TC 100 TG 74 HDL 42 LDL 43 -Decreased atorva to  daily due to mildly elevated LFTs.  -  09/2017 TC 117 HDL 44 LDL 54 TG 103  - reports lasbs with pcp in 08/2018   4.HTN - she is compliant with meds   5. Sinus bradycardia -chronic, asymptomatic. - we lowered lopressor to 12.5mg  bid previously - no recent symptoms   6. LE edema - started after norvasc was increased to  daily back in 10/2015 - norvasc was decreased to  dailywhich improved swelling - mild chronic edema. Previously a fairly benign echo, suspect likely component of venous insufficiency   - chronic and stable, takes her lasix just prn and has not required regularly.     7. Syncope - presented 03/2017 with chest pain and syncope - syncope episode occurred after becoming nauseated - labs suggested she was dehydrated. She had also taken a nitroglycerin while standing around that time.  - no recent issues   SH:  Works at [employer], works Diplomatic Services operational officer at [employer].   The patient does not have symptoms concerning for COVID-19 infection (fever, chills, cough, or new shortness of breath).    Past Medical History:  Diagnosis Date  . Arthritis    back  all over  . Hyperlipidemia   . Hypertension   . Peripheral vascular disease (HCC)   . Pneumonia     50 years  ago  . Sleep apnea      study but dont wear   mask   Past Surgical History:  Procedure Laterality Date  . ABDOMINAL HYSTERECTOMY  1998   [facility]  . CARDIAC CATHETERIZATION    .  CATARACT EXTRACTION W/PHACO  12/10/2011   Procedure: CATARACT EXTRACTION PHACO AND INTRAOCULAR LENS PLACEMENT (IOC);  Surgeon: Gemma Payor, MD;  Location: [facility];  Service: Ophthalmology;  Laterality: Right;  CDE:10.40  . CATARACT EXTRACTION W/PHACO  01/07/2012   Procedure: CATARACT EXTRACTION PHACO AND INTRAOCULAR LENS PLACEMENT (IOC);  Surgeon: Gemma Payor, MD;  Location: [facility];  Service: Ophthalmology;  Laterality: Left;  CDE 8.38  . CORONARY ARTERY BYPASS GRAFT  12/23/2000   [facility]   . LAPAROSCOPIC APPENDECTOMY N/A 02/27/2016    Procedure: APPENDECTOMY LAPAROSCOPIC;  Surgeon: Franky Macho, MD;  Location: [facility];  Service: General;  Laterality: N/A;     No outpatient medications have been marked as taking for the 11/17/18 encounter (Appointment) with Antoine Poche, MD.     Allergies:   Sulfonamide derivatives   Social History   Tobacco Use  . Smoking status: Never Smoker  . Smokeless tobacco: Never Used  Substance Use Topics  . Alcohol use: No    Alcohol/week: 0.0 standard drinks  . Drug use: No     Family Hx: The patient's family history includes Colitis in her sister; Heart attack in her father; Heart disease in her father; Parkinson's disease in her sister.  ROS:   Please see the history of present illness.     All other systems reviewed and are negative.   Prior CV studies:   The following studies were reviewed today:  12/2000 Cath PROCEDURES PERFORMED: 1. Left heart catheterization. 2. Selective coronary arteriography. 3. Selective left ventriculography. 4. Subclavian angiography.  DESCRIPTION OF PROCEDURE: The procedure was performed from the right femoral artery using 6-French catheters. She tolerated the procedure well without complications. She did have elevated blood pressures. She was given two doses of labetalol 20 mg IV, plus an additional dose of 5 mg of intravenous metoprolol. There were no complications from the procedure and she was taken to the holding area in satisfactory clinical condition.  HEMODYNAMIC DATA: Central aortic pressure was 190/83. LV pressure was 190/20. There was no gradient on pullback across the aortic valve.  ANGIOGRAPHIC DATA: 1. Left ventriculography was performed in the RAO projection. Overall  ventricular systolic function was normal. No segmental abnormalities  or contraction were identified. There did not appear to be significant  mitral regurgitation. Ejection fraction was calculated at 69%. 2. Subclavian angiography  revealed a patent subclavian artery, as well as  an internal mammary artery. The internal mammary appeared to be suitable  for grafting. 3. The left main coronary artery was free of critical disease. 4. The left anterior descending artery coursed to the apex where it wrapped  the apical tip. The LAD had about a 50% area of narrowing beyond the  origin of the first diagonal. The first diagonal itself had about 50%  narrowing. Distal to this, there was a segmental area of plaquing leading  into an approximate 90% stenosis just after the large anterolateral .  This anterolateral  itself was a large caliber vessel that had about  80% proximal narrowing. Both vessels appeared to be suitable for grafting. 5. The circumflex artery had mild plaquing of approximately 20% to 30% prior  to the first marginal. The first marginal itself had about 50% ostial and  then a mid 60% stenosis. This vessel also appeared to be suitable for  grafting. 6. The right coronary artery was a large dominant vessel providing a moderate  sized posterior descending and a very large posterolateral . The  right coronary artery had  a 20% to 30% narrowing in the proximal vessel.  In the mid vessel was a focal 90% stenosis.  CONCLUSIONS: 1. Preserved left ventricular function. 2. Patent internal mammary artery. 3. 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 4. Moderate stenosis of the circumflex artery. 5. High-grade stenosis of the mid right coronary artery that is suitable  for intervention.  Cath report 2002 recs: The patient has three-vessel disease. The right is suitable for intervention, but the LAD is a complex lesion with a high risk of target vessel revascularization characterized by a long lesion and small caliber vessel. Based on these risk factors and the multivessel disease, we likely will recommend revascularization  surgery. I will notify her primary care physician and we will get a surgical consultation.DD: 12/20/00 TD: 12/20/00 Job: [id number]     03/2017 Nuclear stress IMPRESSION: 1. Tiny fixed perfusion defect at the apex.  2. Normal left ventricular wall motion.  3. Left ventricular ejection fraction 64%  4. Non invasive risk stratification*: Low risk.  03/2017 echo Study Conclusions  - Left ventricle: The cavity size was normal. Wall thickness was increased in a pattern of mild LVH. Systolic function was normal. The estimated ejection fraction was in the range of 60% to 65%. Chordal SAM noted. Wall motion was normal; there were no regional wall motion abnormalities. Doppler parameters are consistent with abnormal left ventricular relaxation (grade 1 diastolic dysfunction). - Aortic valve: There was no stenosis. - Mitral valve: Mildly calcified annulus. - Right ventricle: The cavity size was normal. Systolic function was normal. - Tricuspid valve: Peak RV-RA gradient (S): 20 mm Hg. - Pulmonary arteries: PA peak pressure: 23 mm Hg (S). - Inferior vena cava: The vessel was normal in size. The respirophasic diameter changes were in the normal range (>= 50%), consistent with normal central venous pressure.  Impressions:  - Normal LV size with mild LV hypertrophy. EF 60-65%. Normal RV size and systolic function. No significant valvular abnormalities.   Labs/Other Tests and Data Reviewed:    EKG:  No ECG reviewed.  Recent Labs: No results found for requested labs within last 8760 hours.   Recent Lipid Panel Lab Results  Component Value Date/Time   CHOL 100 03/20/2017 01:42 AM   TRIG 74 03/20/2017 01:42 AM   HDL 42 03/20/2017 01:42 AM   CHOLHDL 2.4 03/20/2017 01:42 AM   LDLCALC 43 03/20/2017 01:42 AM    Wt Readings from Last 3 Encounters:  04/28/18 178 lb (80.7 kg)  10/29/17 173 lb (78.5 kg)  04/17/17 164 lb (74.4 kg)      Objective:    Vital Signs:   Today's Vitals   11/17/18 0843  BP: (!) 154/90  Weight: 180 lb 3.2 oz (81.7 kg)  Height: 5\' 8"  (1.727 m)   Body mass index is 27.4 kg/m.  Normal affect. Normal speech pattern and tone. Comfortable, no apparent distress. No audible signs of SOB or wheezing.   ASSESSMENT & PLAN:    1. CAD -no recent symptoms, contoinue current meds  2. Carotid stenosis -continue medical therapy, she is to follow up with vascular  3. Hyperlipidemia -  Dosing limited due to mildly elevated LFTs - request pcp labs, continue statin  4. HTN - elevated by home check, she checked early AM prior to meds -she will keep bp log until end of week and call us, asked to take bp's in later afternoon    F/u 6 months.  COVID-19 Education: The signs and symptoms of  COVID-19 were discussed with the patient and how to seek care for testing (follow up with PCP or arrange E-visit).  The importance of social distancing was discussed today.  Time:   Today, I have spent 18 minutes with the patient with telehealth technology discussing the above problems.     Medication Adjustments/Labs and Tests Ordered: Current medicines are reviewed at length with the patient today.  Concerns regarding medicines are outlined above.   Tests Ordered: No orders of the defined types were placed in this encounter.   Medication Changes: No orders of the defined types were placed in this encounter.   Disposition:  Follow up 6 months  Signed, Dina Rich, MD  11/17/2018 8:05 AM    [facility]

## 2018-11-21 ENCOUNTER — Telehealth: Payer: Self-pay | Admitting: Cardiology

## 2018-11-21 DIAGNOSIS — I10 Essential (primary) hypertension: Secondary | ICD-10-CM

## 2018-11-21 MED ORDER — SPIRONOLACTONE 25 MG PO TABS: 12.5000 mg | ORAL_TABLET | Freq: Every day | ORAL | 3 refills | Status: DC

## 2018-11-21 MED ORDER — CHLORTHALIDONE 25 MG PO TABS: 12.5000 mg | ORAL_TABLET | Freq: Every day | ORAL | 3 refills | Status: DC

## 2018-11-21 NOTE — Telephone Encounter (Signed)
Please give pt a call concerning BP readings  

## 2018-11-21 NOTE — Telephone Encounter (Signed)
BP too high. Can we stop her maxzide, start chlorthalidone 12.5mg  daily and also aldactone 12.5mg  daily. BMET/Mg in 2 weeks. Update [place] with bp's in 2 weeks, does not have to check daily, 3-4 times a week is fine   [name] MD

## 2018-11-21 NOTE — Addendum Note (Signed)
Addended by: Marlyn Corporal A on: 11/21/2018 02:36 PM   Modules accepted: Orders

## 2018-11-21 NOTE — Telephone Encounter (Signed)
BP recordings of this past week:  154/79 HR 67, 158/86 , 157/87 HR 66, 138/72  HR 69, 145/77 HR 48, 133/82 HR 72, 137/76 HR 68, 162/92 HR 62, 139/79 HR 70, 157/75 , 145/78 HR 68, 158/84 HR 56       I will forward to [employer]

## 2018-11-21 NOTE — Telephone Encounter (Signed)
Pt will make med changes as directed, I will mail lan slip and she will call back with BP readings

## 2018-11-25 ENCOUNTER — Telehealth: Payer: Self-pay | Admitting: Cardiology

## 2018-11-25 NOTE — Telephone Encounter (Signed)
Pt has questions concerning her medications

## 2018-11-25 NOTE — Telephone Encounter (Signed)
Pt calling to ask what medication she needs to stop and states that she needs another copy of labs mailed to her.

## 2018-12-01 ENCOUNTER — Telehealth: Payer: Self-pay | Admitting: *Deleted

## 2018-12-01 NOTE — Telephone Encounter (Signed)
Pt called to report BP readings from last week.  6/8    147/82  69 6/9    142/74  71  6/10  164/79  60 6/11  153/79  68 6/12  171/85  73 6/13  136/80  76 6/14  159/82  78 Pt states that she feels fine at this time

## 2018-12-01 NOTE — Telephone Encounter (Signed)
BP's still running too high, once we get her labs back we will know if we can adjust the new meds further. Please verify she is planning for labs within the next few days, I believe it was to be 2 weeks from last visit   Zandra Abts MD

## 2018-12-01 NOTE — Telephone Encounter (Signed)
Spoke with pt who states that she plans to have labs done on Thursday morning.

## 2018-12-02 ENCOUNTER — Other Ambulatory Visit (HOSPITAL_COMMUNITY)
Admission: RE | Admit: 2018-12-02 | Discharge: 2018-12-02 | Disposition: A | Discharge status: Home / Self Care | Payer: Medicare HMO | Source: Ambulatory Visit | Attending: Cardiology | Admitting: Cardiology

## 2018-12-02 ENCOUNTER — Other Ambulatory Visit: Payer: Self-pay

## 2018-12-02 DIAGNOSIS — I10 Essential (primary) hypertension: Secondary | ICD-10-CM | POA: Insufficient documentation

## 2018-12-02 LAB — BASIC METABOLIC PANEL
Anion gap: 8 (ref 5–15)
BUN: 18 mg/dL (ref 8–23)
CO2: 31 mmol/L (ref 22–32)
Calcium: 9.4 mg/dL (ref 8.9–10.3)
Chloride: 102 mmol/L (ref 98–111)
Creatinine, Ser: 0.78 mg/dL (ref 0.44–1.00)
GFR calc Af Amer: >60 mL/min (ref >60)
GFR calc non Af Amer: >60 mL/min (ref >60)
Glucose, Bld: 116 mg/dL — ABNORMAL HIGH (ref 70–99)
Potassium: 3.7 mmol/L (ref 3.5–5.1)
Sodium: 141 mmol/L (ref 135–145)

## 2018-12-02 LAB — MAGNESIUM: Magnesium: 2 mg/dL (ref 1.7–2.4)

## 2018-12-09 ENCOUNTER — Telehealth: Payer: Self-pay | Admitting: *Deleted

## 2018-12-09 DIAGNOSIS — Z79899 Other long term (current) drug therapy: Secondary | ICD-10-CM

## 2018-12-09 MED ORDER — SPIRONOLACTONE 25 MG PO TABS: 25.0000 mg | ORAL_TABLET | Freq: Every day | ORAL | 3 refills | Status: DC

## 2018-12-09 MED ORDER — CHLORTHALIDONE 25 MG PO TABS: 25.0000 mg | ORAL_TABLET | Freq: Every day | ORAL | 3 refills | Status: DC

## 2018-12-09 NOTE — Telephone Encounter (Signed)
-----   Message from Arnoldo Lenis, MD sent at 12/08/2018 12:47 PM EDT ----- Labs look good. Her reported bp's were too high last check. Please increase chlorthalidone to 25mg  daily and aldactone to 25mg  daily, recheck BMET/Mg in 2 weeks. Update [place] on bp's in 2 weeks, only needs to check 3-4 times a week  J BrancH MD

## 2018-12-09 NOTE — Telephone Encounter (Signed)
Pt notified and orders placed  

## 2018-12-22 ENCOUNTER — Telehealth: Payer: Self-pay | Admitting: Cardiology

## 2018-12-22 NOTE — Telephone Encounter (Signed)
Please give pt a call concerning BP readings  

## 2018-12-22 NOTE — Telephone Encounter (Signed)
Pt.notified

## 2018-12-22 NOTE — Telephone Encounter (Signed)
136/79 HR 69, 139/66 HR 73, 142/70 HR 64, 130/71 HR 71, 128/75 HR 55, 131/78 HR 68, 130/73 HR 61, 122/88 HR 75 141/82 HR 73, 135/72 HR 77, 124/49 HR 72,  127/80 HR 51

## 2018-12-22 NOTE — Telephone Encounter (Signed)
Numbers look fine, no changes   J Monica Monts MD 

## 2018-12-23 ENCOUNTER — Other Ambulatory Visit: Payer: Self-pay

## 2018-12-23 ENCOUNTER — Other Ambulatory Visit (HOSPITAL_COMMUNITY)
Admission: RE | Admit: 2018-12-23 | Discharge: 2018-12-23 | Disposition: A | Discharge status: Home / Self Care | Payer: Medicare HMO | Source: Ambulatory Visit | Attending: Family Medicine | Admitting: Family Medicine

## 2018-12-23 DIAGNOSIS — Z79899 Other long term (current) drug therapy: Secondary | ICD-10-CM | POA: Insufficient documentation

## 2018-12-23 LAB — BASIC METABOLIC PANEL
Anion gap: 8 (ref 5–15)
BUN: 21 mg/dL (ref 8–23)
CO2: 30 mmol/L (ref 22–32)
Calcium: 9.4 mg/dL (ref 8.9–10.3)
Chloride: 100 mmol/L (ref 98–111)
Creatinine, Ser: 0.91 mg/dL (ref 0.44–1.00)
GFR calc Af Amer: >60 mL/min (ref >60)
GFR calc non Af Amer: >60 mL/min (ref >60)
Glucose, Bld: 115 mg/dL — ABNORMAL HIGH (ref 70–99)
Potassium: 4 mmol/L (ref 3.5–5.1)
Sodium: 138 mmol/L (ref 135–145)

## 2018-12-23 LAB — MAGNESIUM: Magnesium: 1.7 mg/dL (ref 1.7–2.4)

## 2019-02-07 ENCOUNTER — Other Ambulatory Visit: Payer: Self-pay | Admitting: Cardiology

## 2019-02-12 ENCOUNTER — Other Ambulatory Visit: Payer: Self-pay

## 2019-02-12 DIAGNOSIS — I6523 Occlusion and stenosis of bilateral carotid arteries: Secondary | ICD-10-CM

## 2019-02-16 ENCOUNTER — Ambulatory Visit: Payer: Medicare HMO | Admitting: Family

## 2019-02-16 ENCOUNTER — Ambulatory Visit (HOSPITAL_COMMUNITY)
Admission: RE | Admit: 2019-02-16 | Discharge: 2019-02-16 | Disposition: A | Discharge status: Home / Self Care | Payer: Medicare HMO | Source: Ambulatory Visit | Attending: Family | Admitting: Family

## 2019-02-16 ENCOUNTER — Other Ambulatory Visit: Payer: Self-pay

## 2019-02-16 ENCOUNTER — Encounter: Payer: Self-pay | Admitting: Family

## 2019-02-16 VITALS — BP 134/71 | HR 53 | Temp 96.8°F | Resp 16 | Ht 68.0 in | Wt 182.0 lb

## 2019-02-16 DIAGNOSIS — Z9889 Other specified postprocedural states: Secondary | ICD-10-CM

## 2019-02-16 DIAGNOSIS — I6523 Occlusion and stenosis of bilateral carotid arteries: Secondary | ICD-10-CM

## 2019-02-16 NOTE — Progress Notes (Signed)
Chief Complaint: Follow up Extracranial Carotid Artery Stenosis   History of Present Illness  Monica Stafford is a 78 y.o. female who is s/p left carotid endarterectomy in 2002 by Dr. Hart Rochester. She had a carotid ultrasound at [facility] revealed a 50-70% right ICA stenosis and she was referred for further evaluation. She has not had regular follow-up since her original surgery.  Shedenies any known history of stroke or TIA. Specifically she deniesa history of amaurosis fugax or monocular blindness, unilateral facial drooping, hemiplegia, orreceptive or expressive aphasia.   She had an appendectomy in August 2017. Her cardiologist is Dr. Dina Rich. She had a CABG in 2002.   Her right hip and right leg sciatic pain rarely bothers her, states has received ESI's. As long as she does the exercises taught her her in PT, her sciatica remains at bay. She has had an MRI of back and right hip.  Her activity is limited by right sciatic pain, but she still keeps going.   She does not seem to have claudication symptoms with walking, has no tissue loss in her lower extremities.    She works 2 part time jobs. Her oldest son is in a nursing home due to being hit by a car, had a closed head injury.   Diabetic: no Tobacco use: non-smoker  Pt meds include: Statin : yes ASA: yes Other anticoagulants/antiplatelets: no   Past Medical History:  Diagnosis Date  . Arthritis    back  all over  . Hyperlipidemia   . Hypertension   . Peripheral vascular disease (HCC)   . Pneumonia     50 years  ago  . Sleep apnea      study but dont wear   mask    Social History Social History   Tobacco Use  . Smoking status: Never Smoker  . Smokeless tobacco: Never Used  Substance Use Topics  . Alcohol use: No    Alcohol/week: 0.0 standard drinks  . Drug use: No    Family History Family History  Problem Relation Age of Onset  . Heart disease Father        before age  54  . Heart attack Father   . Parkinson's disease Sister   . Colitis Sister     Surgical History Past Surgical History:  Procedure Laterality Date  . ABDOMINAL HYSTERECTOMY  1998   [facility]  . CARDIAC CATHETERIZATION    . CATARACT EXTRACTION W/PHACO  12/10/2011   Procedure: CATARACT EXTRACTION PHACO AND INTRAOCULAR LENS PLACEMENT (IOC);  Surgeon: Gemma Payor, MD;  Location: [facility];  Service: Ophthalmology;  Laterality: Right;  CDE:10.40  . CATARACT EXTRACTION W/PHACO  01/07/2012   Procedure: CATARACT EXTRACTION PHACO AND INTRAOCULAR LENS PLACEMENT (IOC);  Surgeon: Gemma Payor, MD;  Location: [facility];  Service: Ophthalmology;  Laterality: Left;  CDE 8.38  . CORONARY ARTERY BYPASS GRAFT  12/23/2000   [facility]  . LAPAROSCOPIC APPENDECTOMY N/A 02/27/2016   Procedure: APPENDECTOMY LAPAROSCOPIC;  Surgeon: Franky Macho, MD;  Location: [facility];  Service: General;  Laterality: N/A;    Allergies  Allergen Reactions  . Sulfonamide Derivatives Itching    Current Outpatient Medications  Medication Sig Dispense Refill  . acetaminophen (TYLENOL) 500 MG tablet Take 1,000 mg by mouth 3 (three) times daily as needed for mild pain or moderate pain.    Marland Kitchen alendronate (FOSAMAX) 70 MG tablet Take 70 mg by mouth every 7 (seven) days. Take with a full glass  of water on an empty stomach. Usually on Monday    . ALPRAZolam (XANAX) 1 MG tablet Take 0.25-1 mg by mouth 3 (three) times daily as needed for anxiety.     Marland Kitchen. amLODipine (NORVASC) 5 MG tablet Take 1 tablet by mouth once daily 90 tablet 0  . aspirin EC 81 MG tablet Take 81 mg by mouth daily.    Marland Kitchen. atorvastatin (LIPITOR) 40 MG tablet     . Calcium Carbonate-Vitamin D (CALTRATE 600+D) 600-400 MG-UNIT per tablet Take 1 tablet by mouth 3 (three) times daily.     . chlorthalidone (HYGROTON) 25 MG tablet Take 1 tablet (25 mg total) by mouth daily. 90 tablet 3  . estradiol (ESTRACE) 0.1 MG/GM vaginal cream Place 1 g vaginally 3 (three) times a  week.     . fish oil-omega-3 fatty acids 1000 MG capsule Take 1 g by mouth 2 (two) times daily.     Marland Kitchen. KLOR-CON M20 20 MEQ tablet     . lisinopril (PRINIVIL,ZESTRIL) 20 MG tablet Take 20 mg by mouth 2 (two) times daily.    . Multiple Vitamin (MULTIVITAMIN WITH MINERALS) TABS Take 1 tablet by mouth daily.    . nitroGLYCERIN (NITROSTAT) 0.4 MG SL tablet Place 1 tablet (0.4 mg total) under the tongue every 5 (five) minutes as needed for chest pain. 25 tablet 1  . raloxifene (EVISTA) 60 MG tablet Take 60 mg by mouth every other day.     . spironolactone (ALDACTONE) 25 MG tablet Take 1 tablet (25 mg total) by mouth daily. 90 tablet 3  . furosemide (LASIX) 20 MG tablet Take 1 tablet (20 mg total) by mouth daily as needed. (Patient taking differently: Take 10 mg by mouth daily as needed. ) 90 tablet 3  . metoprolol tartrate (LOPRESSOR) 25 MG tablet Take 0.5 tablets (12.5 mg total) by mouth 2 (two) times daily. 90 tablet 3   No current facility-administered medications for this visit.     Review of Systems : See HPI for pertinent positives and negatives.  Physical Examination  Vitals:   02/16/19 1150 02/16/19 1154  BP: 119/62 134/71  Pulse: (!) 53 (!) 53  Resp: 16   Temp: (!) 96.8 F (36 C)   TempSrc: Temporal   SpO2: 100%   Weight: 182 lb (82.6 kg)   Height: 5\' 8"  (1.727 m)    Body mass index is 27.67 kg/m.  General: WDWN female in NAD GAIT: normal Eyes: PERRLA HENT: No gross abnormalities.  Pulmonary:  Respirations are non-labored, good air movement in all fields, CTAB, no rales, rhonchi, or wheezes Cardiac: irregular rhythm, no detected murmur.  VASCULAR EXAM Carotid Bruits Right Left   Negative Negative     Abdominal aortic pulse is not palpable. Radial pulses are 2+ palpable and equal.  LE Pulses Right Left       POPLITEAL  not palpable   not palpable        POSTERIOR TIBIAL  not palpable   not palpable        DORSALIS PEDIS      ANTERIOR TIBIAL not palpable  not palpable    Gastrointestinal: soft, nontender, BS WNL, no r/g, no palpable masses. Musculoskeletal: no muscle atrophy/wasting. M/S 5/5 throughout, extremities without ischemic changes  Skin: No rashes, no ulcers, no cellulitis.   Neurologic:  A&O X 3; appropriate affect, sensation is normal; speech is normal, CN 2-12 intact, pain and light touch intact in extremities, motor exam as listed above. Psychiatric: Normal thought content, mood appropriate to clinical situation.    Assessment: JULIYAH MERGEN is a 78 y.o. female who is s/p left carotid endarterectomy in 2002. She has no history of stroke or TIA.  Fortunately she has never used tobacco and does not have DM. She takes a daily ASA and a statin.   She sees Dr. Harl Bowie, her cardiologist. She states she has no history of an irregular cardiac rhythm. She denies feeling light headed or dyspneic, denies chest pain.  She does take a beta blocker.  I advised her to discuss this with Dr. Harl Bowie.    DATA Carotid Duplex (02-16-19): Right ICA: 1-39% stenosis. Left ICA: CEA site with no restenosis.  Bilateral vertebral arteries are antegrade. Bilateral subclavian arteries are triphasic. No significant change compared to the exams on 04-10-16 and 04-17-17.    Plan: Follow-up in 18 months with Carotid Duplex scan.   I discussed in depth with the patient the nature of atherosclerosis, and emphasized the importance of maximal medical management including strict control of blood pressure, blood glucose, and lipid levels, obtaining regular exercise, and continued cessation of smoking.  The patient is aware that without maximal medical management the underlying atherosclerotic disease process will progress, limiting the benefit of any interventions. The patient was given information about stroke prevention and what symptoms  should prompt the patient to seek immediate medical care. Thank you for allowing [place] to participate in this patient's care.  Clemon Chambers, RN, MSN, FNP-C [facility] Office: [facility] Physician: Trula Slade  02/16/19 12:20 PM

## 2019-02-16 NOTE — Patient Instructions (Signed)

## 2019-04-07 ENCOUNTER — Other Ambulatory Visit: Payer: Self-pay | Admitting: Cardiology

## 2019-05-25 ENCOUNTER — Other Ambulatory Visit: Payer: Self-pay | Admitting: Cardiology

## 2019-06-15 ENCOUNTER — Other Ambulatory Visit: Payer: Self-pay | Admitting: Cardiology

## 2019-06-23 ENCOUNTER — Other Ambulatory Visit (HOSPITAL_COMMUNITY): Payer: Self-pay | Admitting: Family Medicine

## 2019-06-23 DIAGNOSIS — Z1231 Encounter for screening mammogram for malignant neoplasm of breast: Secondary | ICD-10-CM

## 2019-07-24 ENCOUNTER — Telehealth (INDEPENDENT_AMBULATORY_CARE_PROVIDER_SITE_OTHER): Payer: Medicare HMO | Admitting: Cardiology

## 2019-07-24 ENCOUNTER — Encounter: Payer: Self-pay | Admitting: Cardiology

## 2019-07-24 VITALS — BP 116/71 | Ht 68.0 in | Wt 180.0 lb

## 2019-07-24 DIAGNOSIS — I6523 Occlusion and stenosis of bilateral carotid arteries: Secondary | ICD-10-CM

## 2019-07-24 DIAGNOSIS — I2510 Atherosclerotic heart disease of native coronary artery without angina pectoris: Secondary | ICD-10-CM | POA: Diagnosis not present

## 2019-07-24 DIAGNOSIS — I10 Essential (primary) hypertension: Secondary | ICD-10-CM

## 2019-07-24 DIAGNOSIS — E782 Mixed hyperlipidemia: Secondary | ICD-10-CM

## 2019-07-24 NOTE — Patient Instructions (Signed)
Your physician wants you to follow-up in: 2 MONTHS WITH DR BRANCH You will receive a reminder letter in the mail two months in advance. If you don't receive a letter, please call our office to schedule the follow-up appointment.  Your physician recommends that you continue on your current medications as directed. Please refer to the Current Medication list given to you today.  Thank you for choosing [facility]!!    

## 2019-07-24 NOTE — Progress Notes (Signed)
Virtual Visit via Telephone Note   This visit type was conducted due to national recommendations for restrictions regarding the COVID-19 Pandemic (e.g. social distancing) in an effort to limit this patient's exposure and mitigate transmission in our community.  Due to her co-morbid illnesses, this patient is at least at moderate risk for complications without adequate follow up.  This format is felt to be most appropriate for this patient at this time.  The patient did not have access to video technology/had technical difficulties with video requiring transitioning to audio format only (telephone).  All issues noted in this document were discussed and addressed.  No physical exam could be performed with this format.  Please refer to the patient's chart for her  consent to telehealth for [facility].   Date:  07/24/2019   ID:  Monica Stafford, DOB 1941-04-24, MRN [id number]  Patient Location: Home Provider Location: Office  PCP:  Gareth Morgan, MD  Cardiologist:  Dina Rich, MD  Electrophysiologist:  None   Evaluation Performed:  Follow-Up Visit  Chief Complaint:  Follow up  History of Present Illness:    Monica Stafford is a 79 y.o. female seen today for follow up of the following medical problems.   seen today for the following medical problems   1. CAD - history of CABG in 2002 (LIMA-LAD,RIMA-RCA,SVG-OM1,SVG-D2). LVEF 69% by LVgram in 2002 - 03/2017 admit with chest pain. Troponins negative. Nuclear stress without ischemia, low risk - 03/2017 echo LVEF 60-65%, grade I diastolic dysfunction - no beta blocker due to bradycardia    - no recent chest pain -some SOB with walking hill. Some SOB at times. +chronic cough, ;particularty in the morning.    2. Carotid stenosis - history of left CEA 12/2000 -01/2019 carotid US RICA 1-39%, <50% placque LICA with patent CEA - followed by vascualr  3. Hyperlipidemia - 09/2017 TC 117 HDL 44 LDL 54 TG 103  -  labs followed by pcp - compliant withs tatin   4.HTN - she is compliant with meds - she feels like chlorthlaidone or aldactone may be causeing some of her recent stomach issues. Nausea, diarrhea   5. Sinus bradycardia -chronic, asymptomatic. - avoiding av nodal agenta   6. LE edema - started after norvasc was increased to 10mg  daily back in 10/2015 - norvasc was decreased to 5mg  dailywhich improved swelling - mild chronic edema.Previously a fairly benign echo, suspect likely component of venous insufficiency  -stable since last visit   7. Syncope - presented 03/2017 with chest pain and syncope - syncope episode occurred after becoming nauseated - labs suggested she was dehydrated. She had also taken a nitroglycerin while standing around that time.  - no recent issues       SH: Works at , works 04/2017 at [employer].Son passed away in 05-29-23 from COVID.   The patient does not have symptoms concerning for COVID-19 infection (fever, chills, cough, or new shortness of breath).    Past Medical History:  Diagnosis Date  . Arthritis    back  all over  . Hyperlipidemia   . Hypertension   . Peripheral vascular disease (HCC)   . Pneumonia     50 years  ago  . Sleep apnea      study but dont wear   mask   Past Surgical History:  Procedure Laterality Date  . ABDOMINAL HYSTERECTOMY  1998   [facility]  . CARDIAC CATHETERIZATION    . CATARACT EXTRACTION W/PHACO  12/10/2011  Procedure: CATARACT EXTRACTION PHACO AND INTRAOCULAR LENS PLACEMENT (IOC);  Surgeon: Gemma Payor, MD;  Location: [facility];  Service: Ophthalmology;  Laterality: Right;  CDE:10.40  . CATARACT EXTRACTION W/PHACO  01/07/2012   Procedure: CATARACT EXTRACTION PHACO AND INTRAOCULAR LENS PLACEMENT (IOC);  Surgeon: Gemma Payor, MD;  Location: [facility];  Service: Ophthalmology;  Laterality: Left;  CDE 8.38  . CORONARY ARTERY BYPASS GRAFT  12/23/2000   [facility]  .  LAPAROSCOPIC APPENDECTOMY N/A 02/27/2016   Procedure: APPENDECTOMY LAPAROSCOPIC;  Surgeon: Franky Macho, MD;  Location: [facility];  Service: General;  Laterality: N/A;     No outpatient medications have been marked as taking for the 07/24/19 encounter (Appointment) with Antoine Poche, MD.     Allergies:   Sulfonamide derivatives   Social History   Tobacco Use  . Smoking status: Never Smoker  . Smokeless tobacco: Never Used  Substance Use Topics  . Alcohol use: No    Alcohol/week: 0.0 standard drinks  . Drug use: No     Family Hx: The patient's family history includes Colitis in her sister; Heart attack in her father; Heart disease in her father; Parkinson's disease in her sister.  ROS:   Please see the history of present illness.     All other systems reviewed and are negative.   Prior CV studies:   The following studies were reviewed today:  12/2000 Cath PROCEDURES PERFORMED: 1. Left heart catheterization. 2. Selective coronary arteriography. 3. Selective left ventriculography. 4. Subclavian angiography.  DESCRIPTION OF PROCEDURE: The procedure was performed from the right femoral artery using 6-French catheters. She tolerated the procedure well without complications. She did have elevated blood pressures. She was given two doses of labetalol 20 mg IV, plus an additional dose of 5 mg of intravenous metoprolol. There were no complications from the procedure and she was taken to the holding area in satisfactory clinical condition.  HEMODYNAMIC DATA: Central aortic pressure was 190/83. LV pressure was 190/20. There was no gradient on pullback across the aortic valve.  ANGIOGRAPHIC DATA: 1. Left ventriculography was performed in the RAO projection. Overall  ventricular systolic function was normal. No segmental abnormalities  or contraction were identified. There did not appear to be significant  mitral regurgitation. Ejection fraction was  calculated at 69%. 2. Subclavian angiography revealed a patent subclavian artery, as well as  an internal mammary artery. The internal mammary appeared to be suitable  for grafting. 3. The left main coronary artery was free of critical disease. 4. The left anterior descending artery coursed to the apex where it wrapped  the apical tip. The LAD had about a 50% area of narrowing beyond the  origin of the first diagonal. The first diagonal itself had about 50%  narrowing. Distal to this, there was a segmental area of plaquing leading  into an approximate 90% stenosis just after the large anterolateral Teriyah Purington.  This anterolateral Myisha Pickerel itself was a large caliber vessel that had about  80% proximal narrowing. Both vessels appeared to be suitable for grafting. 5. The circumflex artery had mild plaquing of approximately 20% to 30% prior  to the first marginal. The first marginal itself had about 50% ostial and  then a mid 60% stenosis. This vessel also appeared to be suitable for  grafting. 6. The right coronary artery was a large dominant vessel providing a moderate  sized posterior descending and a very large posterolateral Ace Bergfeld. The  right coronary artery had a 20% to 30% narrowing in the  proximal vessel.  In the mid vessel was a focal 90% stenosis.  CONCLUSIONS: 1. Preserved left ventricular function. 2. Patent internal mammary artery. 3. 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 4. Moderate stenosis of the circumflex artery. 5. High-grade stenosis of the mid right coronary artery that is suitable  for intervention.  Cath report 2002 recs: The patient has three-vessel disease. The right is suitable for intervention, but the LAD is a complex lesion with a high risk of target vessel revascularization characterized by a long lesion and small caliber vessel. Based on these risk factors and the multivessel disease, we  likely will recommend revascularization surgery. I will notify her primary care physician and we will get a surgical consultation.DD: 12/20/00 TD: 12/20/00 Job: [id number]     03/2017 Nuclear stress IMPRESSION: 1. Tiny fixed perfusion defect at the apex.  2. Normal left ventricular wall motion.  3. Left ventricular ejection fraction 64%  4. Non invasive risk stratification*: Low risk.  03/2017 echo Study Conclusions  - Left ventricle: The cavity size was normal. Wall thickness was increased in a pattern of mild LVH. Systolic function was normal. The estimated ejection fraction was in the range of 60% to 65%. Chordal SAM noted. Wall motion was normal; there were no regional wall motion abnormalities. Doppler parameters are consistent with abnormal left ventricular relaxation (grade 1 diastolic dysfunction). - Aortic valve: There was no stenosis. - Mitral valve: Mildly calcified annulus. - Right ventricle: The cavity size was normal. Systolic function was normal. - Tricuspid valve: Peak RV-RA gradient (S): 20 mm Hg. - Pulmonary arteries: PA peak pressure: 23 mm Hg (S). - Inferior vena cava: The vessel was normal in size. The respirophasic diameter changes were in the normal range (>= 50%), consistent with normal central venous pressure.  Impressions:  - Normal LV size with mild LV hypertrophy. EF 60-65%. Normal RV size and systolic function. No significant valvular abnormalities.    Labs/Other Tests and Data Reviewed:    EKG:  No ECG reviewed.  Recent Labs: 12/23/2018: BUN 21; Creatinine, Ser 0.91; Magnesium 1.7; Potassium 4.0; Sodium 138   Recent Lipid Panel Lab Results  Component Value Date/Time   CHOL 100 03/20/2017 01:42 AM   TRIG 74 03/20/2017 01:42 AM   HDL 42 03/20/2017 01:42 AM   CHOLHDL 2.4 03/20/2017 01:42 AM   LDLCALC 43 03/20/2017 01:42 AM    Wt Readings from Last 3 Encounters:  02/16/19 182 lb (82.6 kg)    11/17/18 180 lb 3.2 oz (81.7 kg)  04/28/18 178 lb (80.7 kg)     Objective:    Vital Signs:   Today's Vitals   07/24/19 0938  BP: 116/71  Weight: 180 lb (81.6 kg)  Height: 5\' 8"  (1.727 m)   Body mass index is 27.37 kg/m. NOrmal affect. Normal speech pattern and tone. Comfortable, no apparent distress. No audible signs of SOB or wheezing  ASSESSMENT & PLAN:    1. CAD -no recent chest pain - some SOB but also chronic cough and congestion, she is to discuss with pcp - continue current meds  2. Carotid stenosis - continue medical therapy.   3. Hyperlipidemia - continue statin, request pcp labs  4. HTN - at goal, continue current meds -she is to discuss her chronic nausea and diarrhea with pcp. If after there eval no clear etiology could try altering her chlorthalidone or aldactone as she thinks they may be associated   F/u 2 months. Reasess SOB, HTN regimen and  potential side effects   COVID-19 Education: The signs and symptoms of COVID-19 were discussed with the patient and how to seek care for testing (follow up with PCP or arrange E-visit).  The importance of social distancing was discussed today.  Time:   Today, I have spent 22 minutes with the patient with telehealth technology discussing the above problems.     Medication Adjustments/Labs and Tests Ordered: Current medicines are reviewed at length with the patient today.  Concerns regarding medicines are outlined above.   Tests Ordered: No orders of the defined types were placed in this encounter.   Medication Changes: No orders of the defined types were placed in this encounter.   Follow Up:  In Person in 2 month(s)  Signed, Carlyle Dolly, MD  07/24/2019 8:44 AM    [place]

## 2019-08-03 ENCOUNTER — Ambulatory Visit (HOSPITAL_COMMUNITY): Payer: Medicare HMO

## 2019-08-10 ENCOUNTER — Other Ambulatory Visit: Payer: Self-pay

## 2019-08-10 ENCOUNTER — Ambulatory Visit (HOSPITAL_COMMUNITY)
Admission: RE | Admit: 2019-08-10 | Discharge: 2019-08-10 | Disposition: A | Discharge status: Home / Self Care | Payer: Medicare HMO | Source: Ambulatory Visit | Attending: Family Medicine | Admitting: Family Medicine

## 2019-08-10 DIAGNOSIS — Z1231 Encounter for screening mammogram for malignant neoplasm of breast: Secondary | ICD-10-CM

## 2019-09-28 ENCOUNTER — Other Ambulatory Visit: Payer: Self-pay

## 2019-09-28 ENCOUNTER — Ambulatory Visit (INDEPENDENT_AMBULATORY_CARE_PROVIDER_SITE_OTHER): Payer: Medicare HMO | Admitting: Cardiology

## 2019-09-28 ENCOUNTER — Encounter: Payer: Self-pay | Admitting: Cardiology

## 2019-09-28 ENCOUNTER — Encounter: Payer: Self-pay | Admitting: *Deleted

## 2019-09-28 ENCOUNTER — Telehealth: Payer: Self-pay | Admitting: Family Medicine

## 2019-09-28 VITALS — BP 124/66 | HR 64 | Ht 68.0 in | Wt 182.4 lb

## 2019-09-28 DIAGNOSIS — I10 Essential (primary) hypertension: Secondary | ICD-10-CM

## 2019-09-28 DIAGNOSIS — I6523 Occlusion and stenosis of bilateral carotid arteries: Secondary | ICD-10-CM | POA: Diagnosis not present

## 2019-09-28 DIAGNOSIS — I2510 Atherosclerotic heart disease of native coronary artery without angina pectoris: Secondary | ICD-10-CM

## 2019-09-28 DIAGNOSIS — E782 Mixed hyperlipidemia: Secondary | ICD-10-CM | POA: Diagnosis not present

## 2019-09-28 DIAGNOSIS — I4891 Unspecified atrial fibrillation: Secondary | ICD-10-CM

## 2019-09-28 MED ORDER — APIXABAN 5 MG PO TABS: 5.0000 mg | ORAL_TABLET | Freq: Two times a day (BID) | ORAL | 6 refills | Status: DC

## 2019-09-28 MED ORDER — APIXABAN 5 MG PO TABS: 5.0000 mg | ORAL_TABLET | Freq: Two times a day (BID) | ORAL | 0 refills | Status: DC

## 2019-09-28 NOTE — Telephone Encounter (Signed)
  Precert needed for: Echo   Location: [facility]    Date: Oct 22, 2019

## 2019-09-28 NOTE — Patient Instructions (Addendum)
Medication Instructions:   Stop Aspirin.   Begin Eliquis 5mg  twice a day.  Continue all other medications.    Labwork: none  Testing/Procedures:  Your physician has requested that you have an echocardiogram. Echocardiography is a painless test that uses sound waves to create images of your heart. It provides your doctor with information about the size and shape of your heart and how well your heart's chambers and valves are working. This procedure takes approximately one hour. There are no restrictions for this procedure.  Office will contact with results via phone or letter.    Follow-Up: 3 months   Any Other Special Instructions Will Be Listed Below (If Applicable). 1 month with anticoagulation nurse   If you need a refill on your cardiac medications before your next appointment, please call your pharmacy.

## 2019-09-28 NOTE — Progress Notes (Signed)
Clinical Summary Monica Stafford is a 79 y.o.female seen today for follow up of the following medical problems.   seen today for the following medical problems   1. CAD - history of CABG in 2002 (LIMA-LAD,RIMA-RCA,SVG-OM1,SVG-D2). LVEF 69% by LVgram in 2002 - 03/2017 admit with chest pain. Troponins negative. Nuclear stress without ischemia, low risk - 03/2017 echo LVEF 66-06%, grade I diastolic dysfunction - no beta blocker due to bradycardia    - no recent chest pain, no SOB/DOE - compliant with meds - some SOB she attributes to allergies.     2. Carotid stenosis - history of left CEA 12/2000 -01/2019 carotid US RICA 3-01%, <60% placque LICA with patent CEA - followed by vascualr - no recent symptoms  3. Hyperlipidemia -labs followed by pcp, compliant with statin   4.HTN -compliant with meds   5. Afib - new diagnosis today by EKG - no recent symptoms.    6. LE edema - started after norvasc was increased to 10mg  daily back in 10/2015 - norvasc was decreased to 5mg  dailywhich improved swelling - mild chronic edema.Previously a fairly benign echo, suspect likely component of venous insufficiency    7. Syncope - presented 03/2017 with chest pain and syncope - syncope episode occurred after becoming nauseated - labs suggested she was dehydrated. She had also taken a nitroglycerin while standing around that time. - no recent issues      SH: Works at [employer], works Network engineer at [employer].Son passed away in May 28, 2023 from [place].    Past Medical History:  Diagnosis Date  . Arthritis    back  all over  . Hyperlipidemia   . Hypertension   . Peripheral vascular disease ([place])   . Pneumonia     50 years  ago  . Sleep apnea      study but dont wear   mask     Allergies  Allergen Reactions  . Sulfonamide Derivatives Itching     Current Outpatient Medications  Medication Sig Dispense Refill  . acetaminophen (TYLENOL) 500  MG tablet Take 1,000 mg by mouth 3 (three) times daily as needed for mild pain or moderate pain.    Marland Kitchen ALPRAZolam (XANAX) 1 MG tablet Take 0.25-1 mg by mouth 3 (three) times daily as needed for anxiety.     Marland Kitchen amLODipine (NORVASC) 5 MG tablet Take 1 tablet by mouth once daily 90 tablet 1  . aspirin EC 81 MG tablet Take 81 mg by mouth daily.    Marland Kitchen atorvastatin (LIPITOR) 80 MG tablet Take 80 mg by mouth daily.    . Calcium Carbonate-Vitamin D (CALTRATE 600+D) 600-400 MG-UNIT per tablet Take 1 tablet by mouth 3 (three) times daily.     . chlorthalidone (HYGROTON) 25 MG tablet Take 1 tablet (25 mg total) by mouth daily. 90 tablet 3  . estradiol (ESTRACE) 0.1 MG/GM vaginal cream Place 1 g vaginally 3 (three) times a week.     . fish oil-omega-3 fatty acids 1000 MG capsule Take 1 g by mouth 2 (two) times daily.     Marland Kitchen KLOR-CON M20 20 MEQ tablet     . lisinopril (PRINIVIL,ZESTRIL) 20 MG tablet Take 20 mg by mouth 2 (two) times daily.    . metoprolol tartrate (LOPRESSOR) 25 MG tablet Take 1/2 (one-half) tablet by mouth twice daily 90 tablet 1  . Multiple Vitamin (MULTIVITAMIN WITH MINERALS) TABS Take 1 tablet by mouth daily.    . nitroGLYCERIN (NITROSTAT) 0.4 MG SL tablet Place 1  tablet (0.4 mg total) under the tongue every 5 (five) minutes as needed for chest pain. 25 tablet 1  . raloxifene (EVISTA) 60 MG tablet Take 60 mg by mouth every other day.     . spironolactone (ALDACTONE) 25 MG tablet Take 1 tablet (25 mg total) by mouth daily. 90 tablet 3   No current facility-administered medications for this visit.     Past Surgical History:  Procedure Laterality Date  . ABDOMINAL HYSTERECTOMY  1998   [facility]  . CARDIAC CATHETERIZATION    . CATARACT EXTRACTION W/PHACO  12/10/2011   Procedure: CATARACT EXTRACTION PHACO AND INTRAOCULAR LENS PLACEMENT (IOC);  Surgeon: Gemma Payor, MD;  Location: [facility];  Service: Ophthalmology;  Laterality: Right;  CDE:10.40  . CATARACT EXTRACTION W/PHACO   01/07/2012   Procedure: CATARACT EXTRACTION PHACO AND INTRAOCULAR LENS PLACEMENT (IOC);  Surgeon: Gemma Payor, MD;  Location: [facility];  Service: Ophthalmology;  Laterality: Left;  CDE 8.38  . CORONARY ARTERY BYPASS GRAFT  12/23/2000   [facility]  . LAPAROSCOPIC APPENDECTOMY N/A 02/27/2016   Procedure: APPENDECTOMY LAPAROSCOPIC;  Surgeon: Franky Macho, MD;  Location: [facility];  Service: General;  Laterality: N/A;     Allergies  Allergen Reactions  . Sulfonamide Derivatives Itching      Family History  Problem Relation Age of Onset  . Heart disease Father        before age 51  . Heart attack Father   . Parkinson's disease Sister   . Colitis Sister      Social History Ms. Beaumont reports that she has never smoked. She has never used smokeless tobacco. Ms. Treaster reports no history of alcohol use.   Review of Systems CONSTITUTIONAL: No weight loss, fever, chills, weakness or fatigue.  HEENT: Eyes: No visual loss, blurred vision, double vision or yellow sclerae.No hearing loss, sneezing, congestion, runny nose or sore throat.  SKIN: No rash or itching.  CARDIOVASCULAR: per hpi RESPIRATORY: No shortness of breath, cough or sputum.  GASTROINTESTINAL: No anorexia, nausea, vomiting or diarrhea. No abdominal pain or blood.  GENITOURINARY: No burning on urination, no polyuria NEUROLOGICAL: No headache, dizziness, syncope, paralysis, ataxia, numbness or tingling in the extremities. No change in bowel or bladder control.  MUSCULOSKELETAL: No muscle, back pain, joint pain or stiffness.  LYMPHATICS: No enlarged nodes. No history of splenectomy.  PSYCHIATRIC: No history of depression or anxiety.  ENDOCRINOLOGIC: No reports of sweating, cold or heat intolerance. No polyuria or polydipsia.  Marland Kitchen   Physical Examination Today's Vitals   09/28/19 1423  BP: 124/66  Pulse: 64  SpO2: 98%  Weight: 182 lb 6.4 oz (82.7 kg)  Height: 5\' 8"  (1.727 m)   Body mass index is 27.73 kg/m.  Gen:  resting comfortably, no acute distress HEENT: no scleral icterus, pupils equal round and reactive, no palptable cervical adenopathy,  CV:  Irreg, no m/r/g, no jvd Resp: Clear to auscultation bilaterally GI: abdomen is soft, non-tender, non-distended, normal bowel sounds, no hepatosplenomegaly MSK: extremities are warm, no edema.  Skin: warm, no rash Neuro:  no focal deficits Psych: appropriate affect   Diagnostic Studies 12/2000 Cath PROCEDURES PERFORMED: 1. Left heart catheterization. 2. Selective coronary arteriography. 3. Selective left ventriculography. 4. Subclavian angiography.  DESCRIPTION OF PROCEDURE: The procedure was performed from the right femoral artery using 6-French catheters. She tolerated the procedure well without complications. She did have elevated blood pressures. She was given two doses of labetalol 20 mg IV, plus an additional dose of  5 mg of intravenous metoprolol. There were no complications from the procedure and she was taken to the holding area in satisfactory clinical condition.  HEMODYNAMIC DATA: Central aortic pressure was 190/83. LV pressure was 190/20. There was no gradient on pullback across the aortic valve.  ANGIOGRAPHIC DATA: 1. Left ventriculography was performed in the RAO projection. Overall  ventricular systolic function was normal. No segmental abnormalities  or contraction were identified. There did not appear to be significant  mitral regurgitation. Ejection fraction was calculated at 69%. 2. Subclavian angiography revealed a patent subclavian artery, as well as  an internal mammary artery. The internal mammary appeared to be suitable  for grafting. 3. The left main coronary artery was free of critical disease. 4. The left anterior descending artery coursed to the apex where it wrapped  the apical tip. The LAD had about a 50% area of narrowing beyond the  origin of the first diagonal. The first  diagonal itself had about 50%  narrowing. Distal to this, there was a segmental area of plaquing leading  into an approximate 90% stenosis just after the large anterolateral Mayan Kloepfer.  This anterolateral Irie Fiorello itself was a large caliber vessel that had about  80% proximal narrowing. Both vessels appeared to be suitable for grafting. 5. The circumflex artery had mild plaquing of approximately 20% to 30% prior  to the first marginal. The first marginal itself had about 50% ostial and  then a mid 60% stenosis. This vessel also appeared to be suitable for  grafting. 6. The right coronary artery was a large dominant vessel providing a moderate  sized posterior descending and a very large posterolateral Piya Mesch. The  right coronary artery had a 20% to 30% narrowing in the proximal vessel.  In the mid vessel was a focal 90% stenosis.  CONCLUSIONS: 1. Preserved left ventricular function. 2. Patent internal mammary artery. 3. 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 4. Moderate stenosis of the circumflex artery. 5. High-grade stenosis of the mid right coronary artery that is suitable  for intervention.  Cath report 2002 recs: The patient has three-vessel disease. The right is suitable for intervention, but the LAD is a complex lesion with a high risk of target vessel revascularization characterized by a long lesion and small caliber vessel. Based on these risk factors and the multivessel disease, we likely will recommend revascularization surgery. I will notify her primary care physician and we will get a surgical consultation.DD: 12/20/00 TD: 12/20/00 Job: [id number]     03/2017 Nuclear stress IMPRESSION: 1. Tiny fixed perfusion defect at the apex.  2. Normal left ventricular wall motion.  3. Left ventricular ejection fraction 64%  4. Non invasive risk stratification*: Low risk.  03/2017 echo Study  Conclusions  - Left ventricle: The cavity size was normal. Wall thickness was increased in a pattern of mild LVH. Systolic function was normal. The estimated ejection fraction was in the range of 60% to 65%. Chordal SAM noted. Wall motion was normal; there were no regional wall motion abnormalities. Doppler parameters are consistent with abnormal left ventricular relaxation (grade 1 diastolic dysfunction). - Aortic valve: There was no stenosis. - Mitral valve: Mildly calcified annulus. - Right ventricle: The cavity size was normal. Systolic function was normal. - Tricuspid valve: Peak RV-RA gradient (S): 20 mm Hg. - Pulmonary arteries: PA peak pressure: 23 mm Hg (S). - Inferior vena cava: The vessel was normal in size. The respirophasic diameter changes were in the normal range (>= 50%),  consistent with normal central venous pressure.  Impressions:  - Normal LV size with mild LV hypertrophy. EF 60-65%. Normal RV size and systolic function. No significant valvular abnormalities.     Assessment and Plan  1. CAD -no recent symptoms, continue current meds  2. Carotid stenosis -she will continue medical therapy  3. Hyperlipidemia -request pcp labs, continue statin  4. HTN -she is at goal, continue current meds  5. Afib - no symptoms, new diagnosis by EKG today - on lopressor 12.5mg  bid, rates look good - CHADS2Vasc score of 4, stop asa and start eliquis 5mg  bid.       , M.D.

## 2019-10-22 ENCOUNTER — Ambulatory Visit (INDEPENDENT_AMBULATORY_CARE_PROVIDER_SITE_OTHER): Payer: Medicare HMO

## 2019-10-22 ENCOUNTER — Other Ambulatory Visit: Payer: Self-pay

## 2019-10-22 DIAGNOSIS — I4891 Unspecified atrial fibrillation: Secondary | ICD-10-CM

## 2019-10-23 ENCOUNTER — Other Ambulatory Visit: Payer: Self-pay | Admitting: Cardiology

## 2019-10-29 ENCOUNTER — Other Ambulatory Visit: Payer: Self-pay

## 2019-10-29 ENCOUNTER — Ambulatory Visit (INDEPENDENT_AMBULATORY_CARE_PROVIDER_SITE_OTHER): Payer: Medicare HMO | Admitting: *Deleted

## 2019-10-29 ENCOUNTER — Telehealth: Payer: Self-pay | Admitting: *Deleted

## 2019-10-29 DIAGNOSIS — I4891 Unspecified atrial fibrillation: Secondary | ICD-10-CM | POA: Diagnosis not present

## 2019-10-29 DIAGNOSIS — Z5181 Encounter for therapeutic drug level monitoring: Secondary | ICD-10-CM

## 2019-10-29 NOTE — Progress Notes (Signed)
Pt was started on Eliquis 5mg  twice daily for atrial fib on 09/28/19 by Dr 11/28/19.    Labs from 07/28/19:  Hgb 13.7  Hct 40.0  SCr 1.08  Plts.185K  Wt: 82.7kg   Reviewed patients medication list.  Pt is not currently on any combined P-gp and strong CYP3A4 inhibitors/inducers (ketoconazole, traconazole, ritonavir, carbamazepine, phenytoin, rifampin, St. John's wort).  Reviewed labs from 5/17 @ [facility]:  SCr 1.04, Weight 83kg, CrCl 56.53 .  Dose is appropriate based on age, weight, and SCr.  Hgb and HCT: 13.5/40.4  Plts: 145K  A full discussion of the nature of anticoagulants has been carried out.  A benefit/risk analysis has been presented to the patient, so that they understand the justification for choosing anticoagulation with Eliquis at this time.  The need for compliance is stressed.  Pt is aware to take the medication twice daily.  Side effects of potential bleeding are discussed, including unusual colored urine or stools, coughing up blood or coffee ground emesis, nose bleeds or serious fall or head trauma.  Discussed signs and symptoms of stroke. The patient should avoid any OTC items containing aspirin or ibuprofen.  Avoid alcohol consumption.   Call if any signs of abnormal bleeding.  Discussed financial obligations and resolved any difficulty in obtaining medication.    Lab results from 11/02/19 discussed with patient and 6 month follow up made for 05/10/20.

## 2019-10-29 NOTE — Telephone Encounter (Signed)
-----   Message from Antoine Poche, MD sent at 10/29/2019  1:02 PM EDT ----- Echo shows normal heart pumping function. She has some heart valves with mild to moderate leaking but not significant enough to be an issue.   [name] MD

## 2019-11-03 LAB — CBC
HCT: 40.4 % (ref 35.0–45.0)
Hemoglobin: 13.5 g/dL (ref 11.7–15.5)
MCH: 34.9 pg — ABNORMAL HIGH (ref 27.0–33.0)
MCHC: 33.4 g/dL (ref 32.0–36.0)
MCV: 104.4 fL — ABNORMAL HIGH (ref 80.0–100.0)
MPV: 10.8 fL (ref 7.5–12.5)
Platelets: 145 10*3/uL (ref 140–400)
RBC: 3.87 10*6/uL (ref 3.80–5.10)
RDW: 11.7 % (ref 11.0–15.0)
WBC: 6.2 10*3/uL (ref 3.8–10.8)

## 2019-11-03 LAB — BASIC METABOLIC PANEL
BUN/Creatinine Ratio: 17 (calc) (ref 6–22)
BUN: 18 mg/dL (ref 7–25)
CO2: 30 mmol/L (ref 20–32)
Calcium: 9.3 mg/dL (ref 8.6–10.4)
Chloride: 102 mmol/L (ref 98–110)
Creat: 1.04 mg/dL — ABNORMAL HIGH (ref 0.60–0.88)
Glucose, Bld: 109 mg/dL (ref 65–139)
Potassium: 4 mmol/L (ref 3.5–5.3)
Sodium: 138 mmol/L (ref 135–146)

## 2019-11-05 NOTE — Telephone Encounter (Signed)
Pt voiced understanding - routed to pcp  

## 2019-11-10 ENCOUNTER — Telehealth: Payer: Self-pay | Admitting: *Deleted

## 2019-11-10 NOTE — Telephone Encounter (Signed)
Pt aware - routed to pcp  

## 2019-11-10 NOTE — Telephone Encounter (Signed)
-----   Message from Antoine Poche, MD sent at 11/06/2019  9:47 AM EDT ----- Labs look good  [name] MD

## 2019-12-07 ENCOUNTER — Other Ambulatory Visit: Payer: Self-pay | Admitting: Cardiology

## 2019-12-29 ENCOUNTER — Other Ambulatory Visit: Payer: Self-pay | Admitting: Cardiology

## 2019-12-30 NOTE — Progress Notes (Signed)
Cardiology Office Note  Date: 12/31/2019   ID: Monica, Stafford [birth year], MRN [id number]  PCP:  Gareth Morgan, MD  Cardiologist:  Dina Rich, MD Electrophysiologist:  None   Chief Complaint: Follow-up CAD  History of Present Illness: Monica Stafford is a 79 y.o. female with a history of CAD (s/p CABG 2002: LIMA-LAD, RIMA-RCA, SVG-OM1, SVG-D2).  LVEF 69% by LV gram 2002.  Admitted 04/06/2017 chest pain, troponins negative, NST without ischemia, low risk, echocardiogram LVEF 60 to 65%, grade 1 DD, no beta-blocker due to bradycardia.  Carotid stenosis status post left CEA 12/2030.  Carotid ultrasound 02/04/2025 RCA 1 to 39%, less than 67% plaque LICA with patent CEA, followed by vascular.  Compliant with statin for HLD.  History of lower extremity edema starting after amlodipine increased to 10 mg in 2013.  No breast decreased to 5 with improved swelling.  Last visit with Dr. Wyline Mood 09/28/2019.  Had no recent chest pain, shortness of breath, DOE.  Atrial fibrillation was a new diagnosis at last visit by EKG.  CHA2DS2-VASc score 4.  Aspirin was stopped and Eliquis was started at 5 mg p.o. twice daily.  Blood pressure was at goal and she was continuing medications.  Labs were requested from PCP.  She was continuing Lopressor 12.5 mg p.o. twice daily.   Patient is here for 51-month follow-up.  She denies any recent acute illnesses, or hospitalizations.  Denies any progressive anginal or exertional symptoms other than mild exertion when performing more than usual activities.  Otherwise denies any palpitations or arrhythmias, orthostatic symptoms, CVA or TIA-like symptoms, bleeding in stool or urine.  PND, orthopnea, claudication-like symptoms but that states she has back issues and performs daily stretching exercises for her back.  States she has had some steroid injections in the past but these seem to have diminishing effects on her back pain.  She sees vascular for carotid disease  and usually has a yearly carotid artery duplex study.  States she has tried to increase her walking recently.    Past Medical History:  Diagnosis Date  . Arthritis    back  all over  . Hyperlipidemia   . Hypertension   . Peripheral vascular disease (HCC)   . Pneumonia     50 years  ago  . Sleep apnea      study but dont wear   mask    Past Surgical History:  Procedure Laterality Date  . ABDOMINAL HYSTERECTOMY  1998   [facility]  . CARDIAC CATHETERIZATION    . CATARACT EXTRACTION W/PHACO  12/10/2011   Procedure: CATARACT EXTRACTION PHACO AND INTRAOCULAR LENS PLACEMENT (IOC);  Surgeon: Gemma Payor, MD;  Location: [facility];  Service: Ophthalmology;  Laterality: Right;  CDE:10.40  . CATARACT EXTRACTION W/PHACO  01/07/2012   Procedure: CATARACT EXTRACTION PHACO AND INTRAOCULAR LENS PLACEMENT (IOC);  Surgeon: Gemma Payor, MD;  Location: [facility];  Service: Ophthalmology;  Laterality: Left;  CDE 8.38  . CORONARY ARTERY BYPASS GRAFT  12/23/2000   [facility]  . LAPAROSCOPIC APPENDECTOMY N/A 02/27/2016   Procedure: APPENDECTOMY LAPAROSCOPIC;  Surgeon: Franky Macho, MD;  Location: [facility];  Service: General;  Laterality: N/A;    Current Outpatient Medications  Medication Sig Dispense Refill  . acetaminophen (TYLENOL) 500 MG tablet Take 1,000 mg by mouth 3 (three) times daily as needed for mild pain or moderate pain.    Marland Kitchen alendronate (FOSAMAX) 70 MG tablet Take 70 mg by mouth once a week.    Marland Kitchen  ALPRAZolam (XANAX) 1 MG tablet Take 0.25-1 mg by mouth 3 (three) times daily as needed for anxiety.     Marland Kitchen. amLODipine (NORVASC) 5 MG tablet Take 1 tablet by mouth once daily 90 tablet 1  . atorvastatin (LIPITOR) 40 MG tablet Take 40 mg by mouth daily.    . Calcium Carbonate-Vitamin D (CALTRATE 600+D) 600-400 MG-UNIT per tablet Take 1 tablet by mouth 3 (three) times daily.     . chlorthalidone (HYGROTON) 25 MG tablet Take 1 tablet by mouth once daily 90 tablet 1  . ELIQUIS 5 MG TABS tablet  Take 1 tablet by mouth twice daily 60 tablet 6  . estradiol (ESTRACE) 0.1 MG/GM vaginal cream Place 1 g vaginally every 30 (thirty) days.     . fish oil-omega-3 fatty acids 1000 MG capsule Take 1 g by mouth daily.     Marland Kitchen. KLOR-CON M20 20 MEQ tablet Take 20-40 mEq by mouth as directed. Take 40 meq for 2 days then take 20 meq-alternating    . lisinopril (PRINIVIL,ZESTRIL) 20 MG tablet Take 20 mg by mouth 2 (two) times daily.    . metoprolol tartrate (LOPRESSOR) 25 MG tablet Take 1/2 (one-half) tablet by mouth twice daily 90 tablet 1  . Multiple Vitamin (MULTIVITAMIN WITH MINERALS) TABS Take 1 tablet by mouth daily.    . nitroGLYCERIN (NITROSTAT) 0.4 MG SL tablet Place 1 tablet (0.4 mg total) under the tongue every 5 (five) minutes as needed for chest pain. 25 tablet 1  . raloxifene (EVISTA) 60 MG tablet Take 60 mg by mouth every other day.     . spironolactone (ALDACTONE) 25 MG tablet Take 1 tablet by mouth once daily 90 tablet 1   No current facility-administered medications for this visit.   Allergies:  Sulfonamide derivatives   Social History: The patient  reports that she has never smoked. She has never used smokeless tobacco. She reports that she does not drink alcohol and does not use drugs.   Family History: The patient's family history includes Colitis in her sister; Heart attack in her father; Heart disease in her father; Parkinson's disease in her sister.   ROS:  Please see the history of present illness. Otherwise, complete review of systems is positive for none.  All other systems are reviewed and negative.   Physical Exam: VS:  BP 110/70   Pulse 66   Ht 5\' 8"  (1.727 m)   Wt 184 lb 3.2 oz (83.6 kg)   SpO2 95%   BMI 28.01 kg/m , BMI Body mass index is 28.01 kg/m.  Wt Readings from Last 3 Encounters:  12/31/19 184 lb 3.2 oz (83.6 kg)  09/28/19 182 lb 6.4 oz (82.7 kg)  07/24/19 180 lb (81.6 kg)    General: Patient appears comfortable at rest. Neck: Supple, no elevated JVP or  carotid bruits, no thyromegaly. Lungs: Clear to auscultation, nonlabored breathing at rest. Cardiac: Irregularly irregular rate and rhythm, no S3 or significant systolic murmur, no pericardial rub. Abdomen: Soft, nontender, no hepatomegaly, bowel sounds present, no guarding or rebound. Extremities: No pitting edema, distal pulses 2+. Skin: Warm and dry. Musculoskeletal: No kyphosis. Neuropsychiatric: Alert and oriented x3, affect grossly appropriate.  ECG: EKG on 09/28/2019 showed atrial fibrillation with a rate of 75.  Ectopic ventricular couplets  Recent Labwork: 11/02/2019: BUN 18; Creat 1.04; Hemoglobin 13.5; Platelets 145; Potassium 4.0; Sodium 138     Component Value Date/Time   CHOL 100 03/20/2017 0142   TRIG 74 03/20/2017 0142   HDL  42 03/20/2017 0142   CHOLHDL 2.4 03/20/2017 0142   VLDL 15 03/20/2017 0142   LDLCALC 43 03/20/2017 0142    Other Studies Reviewed Today:   Diagnostic Studies 12/2000 Cath PROCEDURES PERFORMED: 1. Left heart catheterization. 2. Selective coronary arteriography. 3. Selective left ventriculography. 4. Subclavian angiography.  DESCRIPTION OF PROCEDURE: The procedure was performed from the right femoral artery using 6-French catheters. She tolerated the procedure well without complications. She did have elevated blood pressures. She was given two doses of labetalol 20 mg IV, plus an additional dose of 5 mg of intravenous metoprolol. There were no complications from the procedure and she was taken to the holding area in satisfactory clinical condition.  HEMODYNAMIC DATA: Central aortic pressure was 190/83. LV pressure was 190/20. There was no gradient on pullback across the aortic valve.  ANGIOGRAPHIC DATA: 1. Left ventriculography was performed in the RAO projection. Overall  ventricular systolic function was normal. No segmental abnormalities  or contraction were identified. There did not appear to be significant   mitral regurgitation. Ejection fraction was calculated at 69%. 2. Subclavian angiography revealed a patent subclavian artery, as well as  an internal mammary artery. The internal mammary appeared to be suitable  for grafting. 3. The left main coronary artery was free of critical disease. 4. The left anterior descending artery coursed to the apex where it wrapped  the apical tip. The LAD had about a 50% area of narrowing beyond the  origin of the first diagonal. The first diagonal itself had about 50%  narrowing. Distal to this, there was a segmental area of plaquing leading  into an approximate 90% stenosis just after the large anterolateral branch.  This anterolateral branch itself was a large caliber vessel that had about  80% proximal narrowing. Both vessels appeared to be suitable for grafting. 5. The circumflex artery had mild plaquing of approximately 20% to 30% prior  to the first marginal. The first marginal itself had about 50% ostial and  then a mid 60% stenosis. This vessel also appeared to be suitable for  grafting. 6. The right coronary artery was a large dominant vessel providing a moderate  sized posterior descending and a very large posterolateral branch. The  right coronary artery had a 20% to 30% narrowing in the proximal vessel.  In the mid vessel was a focal 90% stenosis.  CONCLUSIONS: 1. Preserved left ventricular function. 2. Patent internal mammary artery. 3. 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 4. Moderate stenosis of the circumflex artery. 5. High-grade stenosis of the mid right coronary artery that is suitable  for intervention.  Cath report 2002 recs: The patient has three-vessel disease. The right is suitable for intervention, but the LAD is a complex lesion with a high risk of target vessel revascularization characterized by a long lesion and small caliber vessel. Based on these  risk factors and the multivessel disease, we likely will recommend revascularization surgery. I will notify her primary care physician and we will get a surgical consultation.DD: 12/20/00 TD: 12/20/00 Job: [id number]     03/2017 Nuclear stress IMPRESSION: 1. Tiny fixed perfusion defect at the apex.  2. Normal left ventricular wall motion.  3. Left ventricular ejection fraction 64%  4. Non invasive risk stratification*: Low risk.  03/2017 echo Study Conclusions  - Left ventricle: The cavity size was normal. Wall thickness was increased in a pattern of mild LVH. Systolic function was normal. The estimated ejection fraction was in the range of 60%  to 65%. Chordal SAM noted. Wall motion was normal; there were no regional wall motion abnormalities. Doppler parameters are consistent with abnormal left ventricular relaxation (grade 1 diastolic dysfunction). - Aortic valve: There was no stenosis. - Mitral valve: Mildly calcified annulus. - Right ventricle: The cavity size was normal. Systolic function was normal. - Tricuspid valve: Peak RV-RA gradient (S): 20 mm Hg. - Pulmonary arteries: PA peak pressure: 23 mm Hg (S). - Inferior vena cava: The vessel was normal in size. The respirophasic diameter changes were in the normal range (>= 50%), consistent with normal central venous pressure.  Impressions:  - Normal LV size with mild LV hypertrophy. EF 60-65%. Normal RV size and systolic function. No significant valvular abnormalities.      Assessment and Plan:  1. CAD in native artery   2. Asymptomatic carotid artery stenosis, unspecified laterality   3. Atrial fibrillation, unspecified type (HCC)   4. Essential hypertension   5. Mixed hyperlipidemia    1. CAD in native artery Denies any anginal symptoms.  Does have some minor dyspnea on exertion which is not new. Continue nitroglycerin sublingual as needed.  2. Asymptomatic  carotid artery stenosis, unspecified laterality Follows with vascular. Carotid stenosis status post left CEA 12/2030.  Carotid ultrasound 02/04/2025 RCA 1 to 39%, less than 96% plaque LICA with patent CEA, followed by vascular.  States he usually gets a yearly carotid artery duplex study by vascular.  Continue to follow with vascular.  3. Atrial fibrillation, unspecified type (HCC) Rate is controlled today at 66.  Rhythm is irregularly irregular.  Continue metoprolol 25 mg p.o. twice daily.  Continue Eliquis 5 mg p.o. twice daily  4. Essential hypertension Blood pressure is well controlled today on current medications.  Continue amlodipine 5 mg daily, lisinopril 20 mg daily, metoprolol 25 mg p.o. twice daily.,  Spironolactone 25 mg daily.,  Chlorthalidone 25 mg daily.  5. Mixed hyperlipidemia Continue atorvastatin 40 mg p.o. daily.Lab work on 07/29/2019 from PCP office showed total cholesterol 110, triglycerides 78, LDL 59, HDL 35  Medication Adjustments/Labs and Tests Ordered: Current medicines are reviewed at length with the patient today.  Concerns regarding medicines are outlined above.   Disposition: Follow-up with Dr. Wyline Mood or APP 6 months Signed, Rennis Harding, NP 12/31/2019 10:20 AM    [facility] [address] [place], [place], [place] [address] Phone: [phone]; Fax: [phone]

## 2019-12-31 ENCOUNTER — Encounter: Payer: Self-pay | Admitting: Family Medicine

## 2019-12-31 ENCOUNTER — Ambulatory Visit (INDEPENDENT_AMBULATORY_CARE_PROVIDER_SITE_OTHER): Payer: Medicare HMO | Admitting: Family Medicine

## 2019-12-31 VITALS — BP 110/70 | HR 66 | Ht 68.0 in | Wt 184.2 lb

## 2019-12-31 DIAGNOSIS — I4891 Unspecified atrial fibrillation: Secondary | ICD-10-CM | POA: Diagnosis not present

## 2019-12-31 DIAGNOSIS — I2510 Atherosclerotic heart disease of native coronary artery without angina pectoris: Secondary | ICD-10-CM

## 2019-12-31 DIAGNOSIS — E782 Mixed hyperlipidemia: Secondary | ICD-10-CM

## 2019-12-31 DIAGNOSIS — I6529 Occlusion and stenosis of unspecified carotid artery: Secondary | ICD-10-CM | POA: Diagnosis not present

## 2019-12-31 DIAGNOSIS — I10 Essential (primary) hypertension: Secondary | ICD-10-CM

## 2019-12-31 NOTE — Patient Instructions (Addendum)

## 2020-02-29 ENCOUNTER — Other Ambulatory Visit: Payer: Self-pay

## 2020-02-29 ENCOUNTER — Ambulatory Visit (HOSPITAL_COMMUNITY)
Admission: RE | Admit: 2020-02-29 | Discharge: 2020-02-29 | Disposition: A | Discharge status: Home / Self Care | Payer: Medicare HMO | Source: Ambulatory Visit | Attending: Nurse Practitioner | Admitting: Nurse Practitioner

## 2020-02-29 ENCOUNTER — Other Ambulatory Visit (HOSPITAL_COMMUNITY): Payer: Self-pay | Admitting: Nurse Practitioner

## 2020-02-29 DIAGNOSIS — M25551 Pain in right hip: Secondary | ICD-10-CM | POA: Diagnosis present

## 2020-04-20 ENCOUNTER — Other Ambulatory Visit: Payer: Self-pay | Admitting: *Deleted

## 2020-04-20 MED ORDER — APIXABAN 5 MG PO TABS: 5.0000 mg | ORAL_TABLET | Freq: Two times a day (BID) | ORAL | 6 refills | Status: DC

## 2020-05-10 ENCOUNTER — Other Ambulatory Visit: Payer: Self-pay

## 2020-05-10 ENCOUNTER — Ambulatory Visit: Payer: Medicare HMO | Admitting: *Deleted

## 2020-05-10 DIAGNOSIS — Z5181 Encounter for therapeutic drug level monitoring: Secondary | ICD-10-CM

## 2020-05-10 DIAGNOSIS — I4891 Unspecified atrial fibrillation: Secondary | ICD-10-CM | POA: Diagnosis not present

## 2020-05-10 NOTE — Progress Notes (Signed)
Pt was started on Eliquis 5mg  twice daily for atrial fib on 09/28/19 by Dr 11/28/19.    Pt denies problems taking Eliquis.  No bleeding, increased bruising or GI upset.  Reviewed patients medication list.  Pt is not currently on any combined P-gp and strong CYP3A4 inhibitors/inducers (ketoconazole, traconazole, ritonavir, carbamazepine, phenytoin, rifampin, St. John's wort).  Reviewed labs from 05/20/20 @ [facility]:  SCr 1.29, Weight 83kg, CrCl 56.53 .  Dose is appropriate based on age, weight, and SCr.  Hgb and HCT: 12.2/35.6  Plts: 153  A full discussion of the nature of anticoagulants has been carried out.  A benefit/risk analysis has been presented to the patient, so that they understand the justification for choosing anticoagulation with Eliquis at this time.  The need for compliance is stressed.  Pt is aware to take the medication twice daily.  Side effects of potential bleeding are discussed, including unusual colored urine or stools, coughing up blood or coffee ground emesis, nose bleeds or serious fall or head trauma.  Discussed signs and symptoms of stroke. The patient should avoid any OTC items containing aspirin or ibuprofen.  Avoid alcohol consumption.   Call if any signs of abnormal bleeding.  Discussed financial obligations and resolved any difficulty in obtaining medication.    Called pt with lab results.  F/U in 6 months.

## 2020-05-20 LAB — BASIC METABOLIC PANEL
BUN/Creatinine Ratio: 23 (calc) — ABNORMAL HIGH (ref 6–22)
BUN: 30 mg/dL — ABNORMAL HIGH (ref 7–25)
CO2: 29 mmol/L (ref 20–32)
Calcium: 9.9 mg/dL (ref 8.6–10.4)
Chloride: 103 mmol/L (ref 98–110)
Creat: 1.29 mg/dL — ABNORMAL HIGH (ref 0.60–0.88)
Glucose, Bld: 89 mg/dL (ref 65–139)
Potassium: 4.5 mmol/L (ref 3.5–5.3)
Sodium: 136 mmol/L (ref 135–146)

## 2020-05-20 LAB — CBC
HCT: 35.6 % (ref 35.0–45.0)
Hemoglobin: 12.2 g/dL (ref 11.7–15.5)
MCH: 35.8 pg — ABNORMAL HIGH (ref 27.0–33.0)
MCHC: 34.3 g/dL (ref 32.0–36.0)
MCV: 104.4 fL — ABNORMAL HIGH (ref 80.0–100.0)
MPV: 11.2 fL (ref 7.5–12.5)
Platelets: 153 10*3/uL (ref 140–400)
RBC: 3.41 10*6/uL — ABNORMAL LOW (ref 3.80–5.10)
RDW: 11.6 % (ref 11.0–15.0)
WBC: 5.8 10*3/uL (ref 3.8–10.8)

## 2020-05-30 ENCOUNTER — Telehealth: Payer: Self-pay | Admitting: *Deleted

## 2020-05-30 NOTE — Telephone Encounter (Signed)
-----   Message from Antoine Poche, MD sent at 05/30/2020  9:23 AM EST ----- Labs suggest may be a little dehydrated, needs to work on drinking water regularly 3-4 bottles at least a day  [name] MD

## 2020-05-31 NOTE — Telephone Encounter (Signed)
Pt voiced understanding

## 2020-06-18 ENCOUNTER — Other Ambulatory Visit: Payer: Self-pay | Admitting: Cardiology

## 2020-06-28 ENCOUNTER — Other Ambulatory Visit: Payer: Self-pay | Admitting: Cardiology

## 2020-07-11 ENCOUNTER — Other Ambulatory Visit: Payer: Self-pay | Admitting: Cardiology

## 2020-08-01 ENCOUNTER — Other Ambulatory Visit: Payer: Self-pay

## 2020-08-01 DIAGNOSIS — I6523 Occlusion and stenosis of bilateral carotid arteries: Secondary | ICD-10-CM

## 2020-08-11 ENCOUNTER — Encounter: Payer: Self-pay | Admitting: Cardiology

## 2020-08-11 ENCOUNTER — Ambulatory Visit: Payer: Medicare HMO | Admitting: Cardiology

## 2020-08-11 ENCOUNTER — Encounter: Payer: Self-pay | Admitting: *Deleted

## 2020-08-11 VITALS — BP 110/56 | HR 62 | Ht 68.0 in | Wt 181.0 lb

## 2020-08-11 DIAGNOSIS — I6523 Occlusion and stenosis of bilateral carotid arteries: Secondary | ICD-10-CM

## 2020-08-11 DIAGNOSIS — I10 Essential (primary) hypertension: Secondary | ICD-10-CM | POA: Diagnosis not present

## 2020-08-11 DIAGNOSIS — I4891 Unspecified atrial fibrillation: Secondary | ICD-10-CM

## 2020-08-11 DIAGNOSIS — I2510 Atherosclerotic heart disease of native coronary artery without angina pectoris: Secondary | ICD-10-CM | POA: Diagnosis not present

## 2020-08-11 DIAGNOSIS — E782 Mixed hyperlipidemia: Secondary | ICD-10-CM | POA: Diagnosis not present

## 2020-08-11 NOTE — Patient Instructions (Signed)
Your physician recommends that you schedule a follow-up appointment in: 6 MONTHS WITH DR BRANCH  Your physician recommends that you continue on your current medications as directed. Please refer to the Current Medication list given to you today.  Thank you for choosing [facility]!!    

## 2020-08-11 NOTE — Progress Notes (Signed)
Clinical Summary Monica Stafford is a 80 y.o.female seen today for follow up of the following medical problems.    1. CAD - history of CABG in 2002 (LIMA-LAD,RIMA-RCA,SVG-OM1,SVG-D2). LVEF 69% by LVgram in 2002 - 03/2017 admit with chest pain. Troponins negative. Nuclear stress without ischemia, low risk - 03/2017 echo LVEF 60-65%, grade I diastolic dysfunction - no beta blocker due to bradycardia   No chest pain. No SOB/DOE - compliant with meds    2. Carotid stenosis - history of left CEA 12/2000 -01/2019 carotid US RICA 1-39%, <50% placque LICA with patent CEA -followed by vascualr - upcoming US next month    3. Hyperlipidemia -labs followed by pcp, compliant with statin  - 07/2019 TC 110 HDL 35 LDL 59 TG 78  4.HTN -compliant with meds   5. Afib   - denies any palpitations.  - compliant with meds  6. LE edema - started after norvasc was increased to 10mg  daily back in 10/2015 - norvasc was decreased to 5mg  dailywhich improved swelling - mild chronic edema.Previously a fairly benign echo, suspect likely component of venous insufficiency  - no recent issues  7. Syncope - presented 03/2017 with chest pain and syncope - syncope episode occurred after becoming nauseated - labs suggested she was dehydrated. She had also taken a nitroglycerin while standing around that time.  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   Past Medical History:  Diagnosis Date  . Arthritis    back  all over  . Hyperlipidemia   . Hypertension   . Peripheral vascular disease (HCC)   . Pneumonia     50 years  ago  . Sleep apnea      study but dont wear   mask     Allergies  Allergen Reactions  . Sulfonamide Derivatives Itching     Current Outpatient Medications  Medication Sig Dispense Refill  . metoprolol tartrate (LOPRESSOR) 25 MG tablet Take 1/2 (one-half)  tablet by mouth twice daily 90 tablet 0  . spironolactone (ALDACTONE) 25 MG tablet Take 1 tablet by mouth once daily 90 tablet 0  . acetaminophen (TYLENOL) 500 MG tablet Take 1,000 mg by mouth 3 (three) times daily as needed for mild pain or moderate pain.    Marland Kitchen. alendronate (FOSAMAX) 70 MG tablet Take 70 mg by mouth once a week.    . ALPRAZolam (XANAX) 1 MG tablet Take 0.25-1 mg by mouth 3 (three) times daily as needed for anxiety.     Marland Kitchen. amLODipine (NORVASC) 5 MG tablet Take 1 tablet by mouth once daily 90 tablet 1  . apixaban (ELIQUIS) 5 MG TABS tablet Take 1 tablet (5 mg total) by mouth 2 (two) times daily. 60 tablet 6  . atorvastatin (LIPITOR) 40 MG tablet Take 40 mg by mouth daily.    . Calcium Carbonate-Vitamin D (CALTRATE 600+D) 600-400 MG-UNIT per tablet Take 1 tablet by mouth 3 (three) times daily.     . chlorthalidone (HYGROTON) 25 MG tablet Take 1 tablet by mouth once daily 90 tablet 2  . estradiol (ESTRACE) 0.1 MG/GM vaginal cream Place 1 g vaginally every 30 (thirty) days.     . fish oil-omega-3 fatty acids 1000 MG capsule Take 1 g by mouth daily.     Marland Kitchen. KLOR-CON M20 20 MEQ tablet Take 20-40 mEq by mouth as directed. Take 40 meq for 2 days then take 20 meq-alternating    .  lisinopril (PRINIVIL,ZESTRIL) 20 MG tablet Take 20 mg by mouth 2 (two) times daily.    . Multiple Vitamin (MULTIVITAMIN WITH MINERALS) TABS Take 1 tablet by mouth daily.    . nitroGLYCERIN (NITROSTAT) 0.4 MG SL tablet Place 1 tablet (0.4 mg total) under the tongue every 5 (five) minutes as needed for chest pain. 25 tablet 1  . raloxifene (EVISTA) 60 MG tablet Take 60 mg by mouth every other day.      No current facility-administered medications for this visit.     Past Surgical History:  Procedure Laterality Date  . ABDOMINAL HYSTERECTOMY  1998   [facility]  . CARDIAC CATHETERIZATION    . CATARACT EXTRACTION W/PHACO  12/10/2011   Procedure: CATARACT EXTRACTION PHACO AND INTRAOCULAR LENS PLACEMENT  (IOC);  Surgeon: Gemma Payor, MD;  Location: [facility];  Service: Ophthalmology;  Laterality: Right;  CDE:10.40  . CATARACT EXTRACTION W/PHACO  01/07/2012   Procedure: CATARACT EXTRACTION PHACO AND INTRAOCULAR LENS PLACEMENT (IOC);  Surgeon: Gemma Payor, MD;  Location: [facility];  Service: Ophthalmology;  Laterality: Left;  CDE 8.38  . CORONARY ARTERY BYPASS GRAFT  12/23/2000   [facility]  . LAPAROSCOPIC APPENDECTOMY N/A 02/27/2016   Procedure: APPENDECTOMY LAPAROSCOPIC;  Surgeon: Franky Macho, MD;  Location: [facility];  Service: General;  Laterality: N/A;     Allergies  Allergen Reactions  . Sulfonamide Derivatives Itching      Family History  Problem Relation Age of Onset  . Heart disease Father        before age 32  . Heart attack Father   . Parkinson's disease Sister   . Colitis Sister      Social History Ms. Balla reports that she has never smoked. She has never used smokeless tobacco. Ms. Troxler reports no history of alcohol use.   Review of Systems CONSTITUTIONAL: No weight loss, fever, chills, weakness or fatigue.  HEENT: Eyes: No visual loss, blurred vision, double vision or yellow sclerae.No hearing loss, sneezing, congestion, runny nose or sore throat.  SKIN: No rash or itching.  CARDIOVASCULAR: per hpi RESPIRATORY: No shortness of breath, cough or sputum.  GASTROINTESTINAL: No anorexia, nausea, vomiting or diarrhea. No abdominal pain or blood.  GENITOURINARY: No burning on urination, no polyuria NEUROLOGICAL: No headache, dizziness, syncope, paralysis, ataxia, numbness or tingling in the extremities. No change in bowel or bladder control.  MUSCULOSKELETAL: No muscle, back pain, joint pain or stiffness.  LYMPHATICS: No enlarged nodes. No history of splenectomy.  PSYCHIATRIC: No history of depression or anxiety.  ENDOCRINOLOGIC: No reports of sweating, cold or heat intolerance. No polyuria or polydipsia.  Marland Kitchen   Physical Examination Today's Vitals   08/11/20 0954   BP: (!) 110/56  Pulse: 62  SpO2: 96%  Weight: 181 lb (82.1 kg)  Height: 5\' 8"  (1.727 m)   Body mass index is 27.52 kg/m.  Gen: resting comfortably, no acute distress HEENT: no scleral icterus, pupils equal round and reactive, no palptable cervical adenopathy,  CV: RRR, no m/r/g, no jvd Resp: Clear to auscultation bilaterally GI: abdomen is soft, non-tender, non-distended, normal bowel sounds, no hepatosplenomegaly MSK: extremities are warm, no edema.  Skin: warm, no rash Neuro:  no focal deficits Psych: appropriate affect   Diagnostic Studies 12/2000 Cath PROCEDURES PERFORMED: 1. Left heart catheterization. 2. Selective coronary arteriography. 3. Selective left ventriculography. 4. Subclavian angiography.  DESCRIPTION OF PROCEDURE: The procedure was performed from the right femoral artery using 6-French catheters. She tolerated the procedure well without complications.  She did have elevated blood pressures. She was given two doses of labetalol 20 mg IV, plus an additional dose of 5 mg of intravenous metoprolol. There were no complications from the procedure and she was taken to the holding area in satisfactory clinical condition.  HEMODYNAMIC DATA: Central aortic pressure was 190/83. LV pressure was 190/20. There was no gradient on pullback across the aortic valve.  ANGIOGRAPHIC DATA: 1. Left ventriculography was performed in the RAO projection. Overall  ventricular systolic function was normal. No segmental abnormalities  or contraction were identified. There did not appear to be significant  mitral regurgitation. Ejection fraction was calculated at 69%. 2. Subclavian angiography revealed a patent subclavian artery, as well as  an internal mammary artery. The internal mammary appeared to be suitable  for grafting. 3. The left main coronary artery was free of critical disease. 4. The left anterior descending artery coursed to the apex where it  wrapped  the apical tip. The LAD had about a 50% area of narrowing beyond the  origin of the first diagonal. The first diagonal itself had about 50%  narrowing. Distal to this, there was a segmental area of plaquing leading  into an approximate 90% stenosis just after the large anterolateral branch.  This anterolateral branch itself was a large caliber vessel that had about  80% proximal narrowing. Both vessels appeared to be suitable for grafting. 5. The circumflex artery had mild plaquing of approximately 20% to 30% prior  to the first marginal. The first marginal itself had about 50% ostial and  then a mid 60% stenosis. This vessel also appeared to be suitable for  grafting. 6. The right coronary artery was a large dominant vessel providing a moderate  sized posterior descending and a very large posterolateral branch. The  right coronary artery had a 20% to 30% narrowing in the proximal vessel.  In the mid vessel was a focal 90% stenosis.  CONCLUSIONS: 1. Preserved left ventricular function. 2. Patent internal mammary artery. 3. 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 4. Moderate stenosis of the circumflex artery. 5. High-grade stenosis of the mid right coronary artery that is suitable  for intervention.  Cath report 2002 recs: The patient has three-vessel disease. The right is suitable for intervention, but the LAD is a complex lesion with a high risk of target vessel revascularization characterized by a long lesion and small caliber vessel. Based on these risk factors and the multivessel disease, we likely will recommend revascularization surgery. I will notify her primary care physician and we will get a surgical consultation.DD: 12/20/00 TD: 12/20/00 Job: [id number]     03/2017 Nuclear stress IMPRESSION: 1. Tiny fixed perfusion defect at the apex.  2. Normal left ventricular wall motion.  3.  Left ventricular ejection fraction 64%  4. Non invasive risk stratification*: Low risk.  03/2017 echo Study Conclusions  - Left ventricle: The cavity size was normal. Wall thickness was increased in a pattern of mild LVH. Systolic function was normal. The estimated ejection fraction was in the range of 60% to 65%. Chordal SAM noted. Wall motion was normal; there were no regional wall motion abnormalities. Doppler parameters are consistent with abnormal left ventricular relaxation (grade 1 diastolic dysfunction). - Aortic valve: There was no stenosis. - Mitral valve: Mildly calcified annulus. - Right ventricle: The cavity size was normal. Systolic function was normal. - Tricuspid valve: Peak RV-RA gradient (S): 20 mm Hg. - Pulmonary arteries: PA peak pressure: 23 mm Hg (S). -  Inferior vena cava: The vessel was normal in size. The respirophasic diameter changes were in the normal range (>= 50%), consistent with normal central venous pressure.  Impressions:  - Normal LV size with mild LV hypertrophy. EF 60-65%. Normal RV size and systolic function. No significant valvular abnormalities.      Assessment and Plan   1. CAD - no symptoms, continue current meds  2. Carotid stenosis - continue medical therapy, [place] scheduled for next month  3. Hyperlipidemia -at goal, continue statin  4. HTN -at goal, continue current meds  5. Afib - - no symptoms - continue current meds including anticoag     Antoine Poche, M.D

## 2020-08-16 ENCOUNTER — Ambulatory Visit: Payer: Medicare HMO | Admitting: Physician Assistant

## 2020-08-16 ENCOUNTER — Other Ambulatory Visit: Payer: Self-pay

## 2020-08-16 ENCOUNTER — Encounter: Payer: Self-pay | Admitting: Physician Assistant

## 2020-08-16 ENCOUNTER — Ambulatory Visit (HOSPITAL_COMMUNITY)
Admission: RE | Admit: 2020-08-16 | Discharge: 2020-08-16 | Disposition: A | Discharge status: Home / Self Care | Payer: Medicare HMO | Source: Ambulatory Visit | Attending: Vascular Surgery | Admitting: Vascular Surgery

## 2020-08-16 VITALS — BP 113/64 | HR 44 | Temp 98.2°F | Resp 20 | Ht 68.0 in | Wt 182.2 lb

## 2020-08-16 DIAGNOSIS — I6523 Occlusion and stenosis of bilateral carotid arteries: Secondary | ICD-10-CM | POA: Diagnosis present

## 2020-08-16 NOTE — Progress Notes (Signed)
History of Present Illness:  Patient is a 80 y.o. year old female who presents for evaluation of asymptomatic carotid stenosis. s/p left carotid endarterectomy in 2002by Dr. Hart [place].   The patient denies symptoms of TIA, amaurosis, or stroke.    Pt meds include: Statin : yes Anticoagulation Eliquis  Past Medical History:  Diagnosis Date  . Arthritis    back  all over  . Hyperlipidemia   . Hypertension   . Peripheral vascular disease (HCC)   . Pneumonia     50 years  ago  . Sleep apnea      study but dont wear   mask    Past Surgical History:  Procedure Laterality Date  . ABDOMINAL HYSTERECTOMY  1998   [facility]  . CARDIAC CATHETERIZATION    . CATARACT EXTRACTION W/PHACO  12/10/2011   Procedure: CATARACT EXTRACTION PHACO AND INTRAOCULAR LENS PLACEMENT (IOC);  Surgeon: Gemma Payor, MD;  Location: [facility];  Service: Ophthalmology;  Laterality: Right;  CDE:10.40  . CATARACT EXTRACTION W/PHACO  01/07/2012   Procedure: CATARACT EXTRACTION PHACO AND INTRAOCULAR LENS PLACEMENT (IOC);  Surgeon: Gemma Payor, MD;  Location: [facility];  Service: Ophthalmology;  Laterality: Left;  CDE 8.38  . CORONARY ARTERY BYPASS GRAFT  12/23/2000   [facility]  . LAPAROSCOPIC APPENDECTOMY N/A 02/27/2016   Procedure: APPENDECTOMY LAPAROSCOPIC;  Surgeon: Franky Macho, MD;  Location: [facility];  Service: General;  Laterality: N/A;     Social History Social History   Tobacco Use  . Smoking status: Never Smoker  . Smokeless tobacco: Never Used  Vaping Use  . Vaping Use: Never used  Substance Use Topics  . Alcohol use: No    Alcohol/week: 0.0 standard drinks  . Drug use: No    Family History Family History  Problem Relation Age of Onset  . Heart disease Father        before age 25  . Heart attack Father   . Parkinson's disease Sister   . Colitis Sister     Allergies  Allergies  Allergen Reactions  . Sulfonamide Derivatives Itching     Current Outpatient Medications   Medication Sig Dispense Refill  . acetaminophen (TYLENOL) 500 MG tablet Take 1,000 mg by mouth 3 (three) times daily as needed for mild pain or moderate pain.    Marland Kitchen alendronate (FOSAMAX) 70 MG tablet Take 70 mg by mouth once a week.    . ALPRAZolam (XANAX) 1 MG tablet Take 0.25-1 mg by mouth 3 (three) times daily as needed for anxiety.     Marland Kitchen amLODipine (NORVASC) 5 MG tablet Take 1 tablet by mouth once daily 90 tablet 1  . apixaban (ELIQUIS) 5 MG TABS tablet Take 1 tablet (5 mg total) by mouth 2 (two) times daily. 60 tablet 6  . atorvastatin (LIPITOR) 40 MG tablet Take 40 mg by mouth daily.    . Calcium Carbonate-Vitamin D 600-400 MG-UNIT tablet Take 1 tablet by mouth 3 (three) times daily.     . chlorthalidone (HYGROTON) 25 MG tablet Take 1 tablet by mouth once daily 90 tablet 2  . estradiol (ESTRACE) 0.1 MG/GM vaginal cream Place 1 g vaginally every 30 (thirty) days.     . fish oil-omega-3 fatty acids 1000 MG capsule Take 1 g by mouth daily.     Marland Kitchen KLOR-CON M20 20 MEQ tablet Take 20-40 mEq by mouth as directed. Take 40 meq for 2 days then take 20 meq-alternating    .  lisinopril (PRINIVIL,ZESTRIL) 20 MG tablet Take 20 mg by mouth 2 (two) times daily.    . metoprolol tartrate (LOPRESSOR) 25 MG tablet Take 1/2 (one-half) tablet by mouth twice daily 90 tablet 0  . Multiple Vitamin (MULTIVITAMIN WITH MINERALS) TABS Take 1 tablet by mouth daily.    . nitroGLYCERIN (NITROSTAT) 0.4 MG SL tablet Place 1 tablet (0.4 mg total) under the tongue every 5 (five) minutes as needed for chest pain. 25 tablet 1  . raloxifene (EVISTA) 60 MG tablet Take 60 mg by mouth every other day.     . spironolactone (ALDACTONE) 25 MG tablet Take 1 tablet by mouth once daily 90 tablet 0   No current facility-administered medications for this visit.    ROS:   General:  No weight loss, Fever, chills  HEENT: No recent headaches, no nasal bleeding, no visual changes, no sore throat  Neurologic: No dizziness, blackouts,  seizures. No recent symptoms of stroke or mini- stroke. No recent episodes of slurred speech, or temporary blindness.  Cardiac: No recent episodes of chest pain/pressure, no shortness of breath at rest.  No shortness of breath with exertion.  positive history of atrial fibrillation or irregular heartbeat  Vascular: No history of rest pain in feet.  No history of claudication.  No history of non-healing ulcer, No history of DVT   Pulmonary: No home oxygen, no productive cough, no hemoptysis,  No asthma or wheezing  Musculoskeletal:  [x ] Arthritis, [ ]  Low back pain,  [x ] Joint pain  Hematologic:No history of hypercoagulable state.  No history of easy bleeding.  No history of anemia  Gastrointestinal: No hematochezia or melena,  No gastroesophageal reflux, no trouble swallowing  Urinary: [ ]  chronic Kidney disease, [ ]  on HD - [ ]  MWF or [ ]  TTHS, [ ]  Burning with urination, [ ]  Frequent urination, [ ]  Difficulty urinating;   Skin: No rashes  Psychological: No history of anxiety,  No history of depression   Physical Examination  Vitals:   08/16/20 1027 08/16/20 1030  BP: 110/70 113/64  Pulse: (!) 44   Resp: 20   Temp: 98.2 F (36.8 C)   TempSrc: Temporal   SpO2: 100%   Weight: 182 lb 3.2 oz (82.6 kg)   Height: 5\' 8"  (1.727 m)     Body mass index is 27.7 kg/m.  General:  Alert and oriented, no acute distress HEENT: Normal Neck: No bruit or JVD Pulmonary: Clear to auscultation bilaterally Cardiac: Regular Rate and Rhythm without murmur Afib not detected on exam today Gastrointestinal: Soft, non-tender, non-distended, no mass, no scars Skin: No rash Extremity Pulses:  2+ radial, brachial, femoral, not palpable dorsalis pedis, posterior tibial pulses bilaterally Musculoskeletal: No deformity or edema  Neurologic: Upper and lower extremity motor 5/5 and symmetric  DATA:  Right Carotid Findings:  +----------+--------+--------+--------+-------------------------+--------+        PSV cm/sEDV cm/sStenosisPlaque Description    Comments  +----------+--------+--------+--------+-------------------------+--------+  CCA Prox 93   13                          +----------+--------+--------+--------+-------------------------+--------+  CCA Mid  76   14                          +----------+--------+--------+--------+-------------------------+--------+  CCA Distal69   20       heterogenous and calcific      +----------+--------+--------+--------+-------------------------+--------+  ICA Prox 121   23  1-39%  heterogenous and calcific      +----------+--------+--------+--------+-------------------------+--------+  ICA Mid  87   22                          +----------+--------+--------+--------+-------------------------+--------+  ICA Distal85   26                          +----------+--------+--------+--------+-------------------------+--------+  ECA    76   6        heterogenous             +----------+--------+--------+--------+-------------------------+--------+   +----------+--------+-------+----------------+-------------------+       PSV cm/sEDV cmsDescribe    Arm Pressure (mmHG)  +----------+--------+-------+----------------+-------------------+  [id number]       Multiphasic, WNL            +----------+--------+-------+----------------+-------------------+   +---------+--------+--+--------+--+---------+  VertebralPSV cm/s58EDV cm/s11Antegrade  +---------+--------+--+--------+--+---------+      Left Carotid Findings:  +----------+--------+--------+--------+------------------+--------+       PSV cm/sEDV cm/sStenosisPlaque DescriptionComments   +----------+--------+--------+--------+------------------+--------+  CCA Prox 95   24                      +----------+--------+--------+--------+------------------+--------+  CCA Mid  67   13                      +----------+--------+--------+--------+------------------+--------+  CCA Distal69   17       heterogenous         +----------+--------+--------+--------+------------------+--------+  ICA Prox 41   12                      +----------+--------+--------+--------+------------------+--------+  ICA Mid  78   23                      +----------+--------+--------+--------+------------------+--------+  ICA Distal96   31                      +----------+--------+--------+--------+------------------+--------+  ECA    89   6                       +----------+--------+--------+--------+------------------+--------+   +----------+--------+--------+----------------+-------------------+       PSV cm/sEDV cm/sDescribe    Arm Pressure (mmHG)  +----------+--------+--------+----------------+-------------------+  [id number]       Multiphasic, WNL            +----------+--------+--------+----------------+-------------------+   +---------+--------+--+--------+--+---------+  VertebralPSV cm/s70EDV cm/s13Antegrade  +---------+--------+--+--------+--+---------+        Summary:  Right Carotid: Velocities in the right ICA are consistent with a 1-39%  stenosis.   Left Carotid: Patent carotid endarterectomy. The extracranial vessels were        near-normal with only minimal wall thickening or plaque.   Vertebrals: Bilateral vertebral arteries demonstrate antegrade flow.   ASSESSMENT:  Carotid stenosis s/p left CEA followed by redo  left CEA for patch rejection per patient report 2003. She has remained asymptomatic and the duplex shows stenosis B < 39%.  No restenosis on the left ICA.   PLAN:  Continue  Statin and Eliquis for Afib daily.  Activity as tolerates.  F/U in 1 year for repeat carotid duplex.  If she develops signs or symptoms of stroke/TIA she will call 911.   Mosetta Pigeon PA-C [facility] Office: [phone]  MD in clinic [place]

## 2020-08-22 ENCOUNTER — Other Ambulatory Visit (HOSPITAL_COMMUNITY): Payer: Self-pay | Admitting: Family Medicine

## 2020-08-22 DIAGNOSIS — Z1231 Encounter for screening mammogram for malignant neoplasm of breast: Secondary | ICD-10-CM

## 2020-09-01 ENCOUNTER — Ambulatory Visit (HOSPITAL_COMMUNITY)
Admission: RE | Admit: 2020-09-01 | Discharge: 2020-09-01 | Disposition: A | Discharge status: Home / Self Care | Payer: Medicare HMO | Source: Ambulatory Visit | Attending: Family Medicine | Admitting: Family Medicine

## 2020-09-01 DIAGNOSIS — Z1231 Encounter for screening mammogram for malignant neoplasm of breast: Secondary | ICD-10-CM | POA: Diagnosis not present

## 2020-09-13 ENCOUNTER — Other Ambulatory Visit: Payer: Self-pay | Admitting: Cardiology

## 2020-10-03 ENCOUNTER — Other Ambulatory Visit: Payer: Self-pay | Admitting: Cardiology

## 2020-10-04 ENCOUNTER — Telehealth: Payer: Self-pay | Admitting: Cardiology

## 2020-10-04 ENCOUNTER — Other Ambulatory Visit: Payer: Self-pay

## 2020-10-04 NOTE — Progress Notes (Signed)
Update to medication list

## 2020-10-04 NOTE — Telephone Encounter (Signed)
Clarification of medication- Spironolactone or Chlorthalidone.  According to patient medication list, Spironolactone was d/c on 09/13/2020 by Garnet Sierras, LPN.

## 2020-10-04 NOTE — Telephone Encounter (Signed)
New message    Pharmacy needs call back regarding fax they received on   chlorthalidone (HYGROTON) 25 MG tablet spiralactone

## 2020-11-21 ENCOUNTER — Other Ambulatory Visit: Payer: Self-pay | Admitting: Cardiology

## 2020-11-21 NOTE — Telephone Encounter (Signed)
Prescription refill request for Eliquis received. Indication: Atrial Fib Last office visit: 08/11/20 / [place] Scr: 1.29 on 05/20/20 Age: 80 Weight: 82.1kg  Based on above findings Eliquis 5mg  daily is the appropriate dose.  Refill approved.  Pt has f/u appt with Dr in August.  Will get CBC/BMP at that time.

## 2020-12-14 ENCOUNTER — Other Ambulatory Visit: Payer: Self-pay | Admitting: Cardiology

## 2021-01-17 ENCOUNTER — Telehealth: Payer: Self-pay | Admitting: Cardiology

## 2021-01-17 NOTE — Telephone Encounter (Signed)
New message     Pt c/o medication issue:  1. Name of Medication:  eliquis   2. How are you currently taking this medication (dosage and times per day)? 2x a day    3. Are you having a reaction (difficulty breathing--STAT)? no  4. What is your medication issue? When she went to pharmacy to pick it up it was $162 she can not afford that so she just left it at the pharmacy

## 2021-01-17 NOTE — Telephone Encounter (Signed)
Per [facility] Eliquis is $142.49 this month, last month (July 2022) $62 and month prior (June 2022) was $42.

## 2021-01-18 MED ORDER — APIXABAN 5 MG PO TABS
5.0000 mg | ORAL_TABLET | Freq: Two times a day (BID) | ORAL | 0 refills | Status: DC
Start: 2021-01-18 — End: 2021-04-17

## 2021-01-18 NOTE — Telephone Encounter (Signed)
Patient called back [facility] a call from [place]. She said she can not afford the Eliquis and was wanting to know if she could get samples or either change meds to something affordable for her. She would like a nurse or Dr Wyline Mood to call her back #[phone]. She will also discuss at her next Appt too.

## 2021-01-18 NOTE — Telephone Encounter (Signed)
Patient assistance number for BMS-PAF for donut hole given to patient [phone] to call for assistance. Advised that samples available for pick up. Verbalized understanding of plan.

## 2021-02-09 ENCOUNTER — Encounter: Payer: Self-pay | Admitting: Cardiology

## 2021-02-09 ENCOUNTER — Ambulatory Visit: Payer: Medicare HMO | Admitting: Cardiology

## 2021-02-09 ENCOUNTER — Other Ambulatory Visit: Payer: Self-pay

## 2021-02-09 VITALS — BP 122/72 | HR 59 | Ht 68.0 in | Wt 182.4 lb

## 2021-02-09 DIAGNOSIS — I4891 Unspecified atrial fibrillation: Secondary | ICD-10-CM

## 2021-02-09 DIAGNOSIS — I10 Essential (primary) hypertension: Secondary | ICD-10-CM | POA: Diagnosis not present

## 2021-02-09 DIAGNOSIS — I6523 Occlusion and stenosis of bilateral carotid arteries: Secondary | ICD-10-CM | POA: Diagnosis not present

## 2021-02-09 DIAGNOSIS — I2510 Atherosclerotic heart disease of native coronary artery without angina pectoris: Secondary | ICD-10-CM | POA: Diagnosis not present

## 2021-02-09 DIAGNOSIS — R600 Localized edema: Secondary | ICD-10-CM

## 2021-02-09 NOTE — Patient Instructions (Addendum)

## 2021-02-09 NOTE — Progress Notes (Addendum)
Clinical Summary Monica Stafford is a 80 y.o.femaleseen today for follow up of the following medical problems.         1. CAD - history of CABG in 2002 (LIMA-LAD,RIMA-RCA,SVG-OM1,SVG-D2). LVEF 69% by LVgram in 2002  - 03/2017 admit with chest pain. Troponins negative. Nuclear stress without ischemia, low risk - 03/2017 echo LVEF 60-65%, grade I diastolic dysfunction - no beta blocker due to bradycardia      - no chest pain, no SOB/DOE - compliant with meds   2. Carotid stenosis - history of left CEA 12/2000 -01/2019 carotid US RICA 1-39%, <50% placque LICA with patent CEA - followed by vascualr   08/2020 RICA 1-39%, LICA patent CEA     3. Hyperlipidemia   - 07/2019 TC 110 HDL 35 LDL 59 TG 78 - labs followed by pcp   4.HTN -compliant with meds  - pcp lowered lisinopril to 10mg  bid.     5. Afib     - no recent palpitaitons - no bleeding on eliquis.    6. LE edema - started after norvasc was increased to 10mg  daily back in 10/2015 - norvasc was decreased to 5mg  daily which improved swelling - mild chronic edema. Previously a fairly benign echo, suspect likely component of venous insufficiency    - ongoing LE edema, overall she reports not bothersome.     7. Syncope - presented 03/2017 with chest pain and syncope - syncope episode occurred after becoming nauseated - labs suggested she was dehydrated. She had also taken a nitroglycerin while standing around that time.    - no recent issues           SH:  Works at 11/2015..  Son passed away in 2023-05-30 from COVID.    Past Medical History:  Diagnosis Date   Arthritis    back  all over   Hyperlipidemia    Hypertension    Peripheral vascular disease (HCC)    Pneumonia     50 years  ago   Sleep apnea      study but dont wear   mask     Allergies  Allergen Reactions   Sulfonamide Derivatives Itching     Current Outpatient Medications  Medication Sig Dispense Refill   acetaminophen (TYLENOL) 500  MG tablet Take 1,000 mg by mouth 3 (three) times daily as needed for mild pain or moderate pain.     alendronate (FOSAMAX) 70 MG tablet Take 70 mg by mouth once a week.     ALPRAZolam (XANAX) 1 MG tablet Take 0.25-1 mg by mouth 3 (three) times daily as needed for anxiety.      amLODipine (NORVASC) 5 MG tablet Take 1 tablet by mouth once daily 90 tablet 2   apixaban (ELIQUIS) 5 MG TABS tablet Take 1 tablet (5 mg total) by mouth 2 (two) times daily. 56 tablet 0   atorvastatin (LIPITOR) 40 MG tablet Take 40 mg by mouth daily.     Calcium Carbonate-Vitamin D 600-400 MG-UNIT tablet Take 1 tablet by mouth 3 (three) times daily.      chlorthalidone (HYGROTON) 25 MG tablet Take 1 tablet by mouth once daily 90 tablet 2   estradiol (ESTRACE) 0.1 MG/GM vaginal cream Place 1 g vaginally every 30 (thirty) days.      fish oil-omega-3 fatty acids 1000 MG capsule Take 1 g by mouth daily.      KLOR-CON M20 20 MEQ tablet Take 20-40 mEq by mouth as directed. Take 40  meq for 2 days then take 20 meq-alternating     lisinopril (PRINIVIL,ZESTRIL) 20 MG tablet Take 20 mg by mouth 2 (two) times daily.     metoprolol tartrate (LOPRESSOR) 25 MG tablet Take 1/2 (one-half) tablet by mouth twice daily 90 tablet 2   Multiple Vitamin (MULTIVITAMIN WITH MINERALS) TABS Take 1 tablet by mouth daily.     nitroGLYCERIN (NITROSTAT) 0.4 MG SL tablet Place 1 tablet (0.4 mg total) under the tongue every 5 (five) minutes as needed for chest pain. 25 tablet 1   raloxifene (EVISTA) 60 MG tablet Take 60 mg by mouth every other day.      No current facility-administered medications for this visit.     Past Surgical History:  Procedure Laterality Date   ABDOMINAL HYSTERECTOMY  1998   [facility]   CARDIAC CATHETERIZATION     CATARACT EXTRACTION W/PHACO  12/10/2011   Procedure: CATARACT EXTRACTION PHACO AND INTRAOCULAR LENS PLACEMENT (IOC);  Surgeon: Gemma Payor, MD;  Location: [facility];  Service: Ophthalmology;  Laterality:  Right;  CDE:10.40   CATARACT EXTRACTION W/PHACO  01/07/2012   Procedure: CATARACT EXTRACTION PHACO AND INTRAOCULAR LENS PLACEMENT (IOC);  Surgeon: Gemma Payor, MD;  Location: [facility];  Service: Ophthalmology;  Laterality: Left;  CDE 8.38   CORONARY ARTERY BYPASS GRAFT  12/23/2000   [facility]   LAPAROSCOPIC APPENDECTOMY N/A 02/27/2016   Procedure: APPENDECTOMY LAPAROSCOPIC;  Surgeon: Franky Macho, MD;  Location: [facility];  Service: General;  Laterality: N/A;     Allergies  Allergen Reactions   Sulfonamide Derivatives Itching      Family History  Problem Relation Age of Onset   Heart disease Father        before age 70   Heart attack Father    Parkinson's disease Sister    Colitis Sister      Social History Monica Stafford reports that she has never smoked. She has never used smokeless tobacco. Monica Stafford reports no history of alcohol use.   Review of Systems CONSTITUTIONAL: No weight loss, fever, chills, weakness or fatigue.  HEENT: Eyes: No visual loss, blurred vision, double vision or yellow sclerae.No hearing loss, sneezing, congestion, runny nose or sore throat.  SKIN: No rash or itching.  CARDIOVASCULAR: per hpi RESPIRATORY: No shortness of breath, cough or sputum.  GASTROINTESTINAL: No anorexia, nausea, vomiting or diarrhea. No abdominal pain or blood.  GENITOURINARY: No burning on urination, no polyuria NEUROLOGICAL: No headache, dizziness, syncope, paralysis, ataxia, numbness or tingling in the extremities. No change in bowel or bladder control.  MUSCULOSKELETAL: No muscle, back pain, joint pain or stiffness.  LYMPHATICS: No enlarged nodes. No history of splenectomy.  PSYCHIATRIC: No history of depression or anxiety.  ENDOCRINOLOGIC: No reports of sweating, cold or heat intolerance. No polyuria or polydipsia.  Marland Kitchen   Physical Examination Today's Vitals   02/09/21 1245  BP: 122/72  Pulse: (!) 59  SpO2: 98%  Weight: 182 lb 6.4 oz (82.7 kg)  Height: 5\' 8"  (1.727  m)   Body mass index is 27.73 kg/m.  Gen: resting comfortably, no acute distress HEENT: no scleral icterus, pupils equal round and reactive, no palptable cervical adenopathy,  CV: RRR, no m/r/g,no jvd Resp: Clear to auscultation bilaterally GI: abdomen is soft, non-tender, non-distended, normal bowel sounds, no hepatosplenomegaly MSK: extremities are warm, 1+ bilateral LE edema Skin: warm, no rash Neuro:  no focal deficits Psych: appropriate affect   Diagnostic Studies 12/2000 Cath PROCEDURES PERFORMED: 1. Left heart catheterization. 2.  Selective coronary arteriography. 3. Selective left ventriculography. 4. Subclavian angiography.   DESCRIPTION OF PROCEDURE:  The procedure was performed from the right femoral artery using 6-French catheters.  She tolerated the procedure well without complications.  She did have elevated blood pressures.  She was given two doses of labetalol 20 mg IV, plus an additional dose of 5 mg of intravenous metoprolol.  There were no complications from the procedure and she was taken to the holding area in satisfactory clinical condition.   HEMODYNAMIC DATA:  Central aortic pressure was 190/83.  LV pressure was 190/20.  There was no gradient on pullback across the aortic valve.   ANGIOGRAPHIC DATA: 1. Left ventriculography was performed in the RAO projection.  Overall    ventricular systolic function was normal.  No segmental abnormalities    or contraction were identified.  There did not appear to be significant    mitral regurgitation.  Ejection fraction was calculated at 69%. 2. Subclavian angiography revealed a patent subclavian artery, as well as    an internal mammary artery.  The internal mammary appeared to be suitable    for grafting. 3. The left main coronary artery was free of critical disease. 4. The left anterior descending artery coursed to the apex where it wrapped    the apical tip.  The LAD had about a 50% area of narrowing beyond  the    origin of the first diagonal.  The first diagonal itself had about 50%    narrowing.  Distal to this, there was a segmental area of plaquing leading    into an approximate 90% stenosis just after the large anterolateral Mia Milan.    This anterolateral Kearia Yin itself was a large caliber vessel that had about    80% proximal narrowing.  Both vessels appeared to be suitable for grafting. 5. The circumflex artery had mild plaquing of approximately 20% to 30% prior    to the first marginal.  The first marginal itself had about 50% ostial and    then a mid 60% stenosis.  This vessel also appeared to be suitable for    grafting. 6. The right coronary artery was a large dominant vessel providing a moderate    sized posterior descending and a very large posterolateral Korbin Mapps.  The    right coronary artery had a 20% to 30% narrowing in the proximal vessel.    In the mid vessel was a focal 90% stenosis.   CONCLUSIONS: 1. Preserved left ventricular function. 2. Patent internal mammary artery. 3. 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 4. Moderate stenosis of the circumflex artery. 5. 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   Cath report  2002 recs:  The patient has three-vessel disease.  The right is suitable for intervention, but the LAD is a complex lesion with a high risk of target vessel revascularization characterized by a long lesion and small caliber vessel.  Based on these risk factors and the multivessel disease, we likely will recommend revascularization surgery.  I will notify her primary care physician and we will get a surgical consultation.DD:  12/20/00 TD:  12/20/00 Job: [id number]      03/2017 Nuclear stress IMPRESSION: 1. Tiny fixed perfusion defect at the apex.   2. Normal left ventricular wall motion.   3. Left ventricular ejection fraction 64%   4. Non invasive risk  stratification*: Low risk.   03/2017 echo Study Conclusions   -  Left ventricle: The cavity size was normal. Wall thickness was   increased in a pattern of mild LVH. Systolic function was normal.   The estimated ejection fraction was in the range of 60% to 65%.   Chordal SAM noted. Wall motion was normal; there were no regional   wall motion abnormalities. Doppler parameters are consistent with   abnormal left ventricular relaxation (grade 1 diastolic   dysfunction). - Aortic valve: There was no stenosis. - Mitral valve: Mildly calcified annulus. - Right ventricle: The cavity size was normal. Systolic function   was normal. - Tricuspid valve: Peak RV-RA gradient (S): 20 mm Hg. - Pulmonary arteries: PA peak pressure: 23 mm Hg (S). - Inferior vena cava: The vessel was normal in size. The   respirophasic diameter changes were in the normal range (>= 50%),   consistent with normal central venous pressure.   Impressions:   - Normal LV size with mild LV hypertrophy. EF 60-65%. Normal RV   size and systolic function. No significant valvular   abnormalities.     Assessment and Plan   1. CAD - no recent symptoms, continue current meds   2. Carotid stenosis - continue to follow with vacular, recent imaging overall looked good - continue medical therapy   3. Hyperlipidemia - request labs from pcp, continue curren tmeds   4. HTN - she is at goal, continue current meds   5. Afib - no recent symptms, continue current meds - EKG today shows rate controlled afib  6. LE edema - likely secondary to venous insufficiecny, we discussed perhaps prn lasix but she reports overall not bothersome, just monitor at this time.      Antoine PocheJonathan F. Shrihan Putt, M.D.

## 2021-04-17 ENCOUNTER — Other Ambulatory Visit: Payer: Self-pay | Admitting: Cardiology

## 2021-04-17 DIAGNOSIS — Z5181 Encounter for therapeutic drug level monitoring: Secondary | ICD-10-CM

## 2021-04-17 DIAGNOSIS — I4891 Unspecified atrial fibrillation: Secondary | ICD-10-CM

## 2021-04-17 NOTE — Telephone Encounter (Signed)
Prescription refill request for Eliquis received. Indication: Atrial fib Last office visit: 02/09/21 [name] MD Scr: 1.29 on 05/20/20 Age: 80 Weight: 82.7kg  Based on above findings Eliquis 5mg  twice daily is the appropriate dose.  Pt is past due for lab work.  Lab orders mailed to patient to have labs drawn at [facility].  Refill approved x 1.

## 2021-04-26 LAB — BASIC METABOLIC PANEL
BUN: 17 mg/dL (ref 7–25)
CO2: 29 mmol/L (ref 20–32)
Calcium: 9.5 mg/dL (ref 8.6–10.4)
Chloride: 99 mmol/L (ref 98–110)
Creat: 0.79 mg/dL (ref 0.60–0.95)
Glucose, Bld: 99 mg/dL (ref 65–99)
Potassium: 3.6 mmol/L (ref 3.5–5.3)
Sodium: 139 mmol/L (ref 135–146)

## 2021-04-26 LAB — CBC
HCT: 41.4 % (ref 35.0–45.0)
Hemoglobin: 14 g/dL (ref 11.7–15.5)
MCH: 35.4 pg — ABNORMAL HIGH (ref 27.0–33.0)
MCHC: 33.8 g/dL (ref 32.0–36.0)
MCV: 104.5 fL — ABNORMAL HIGH (ref 80.0–100.0)
MPV: 11 fL (ref 7.5–12.5)
Platelets: 132 10*3/uL — ABNORMAL LOW (ref 140–400)
RBC: 3.96 10*6/uL (ref 3.80–5.10)
RDW: 11.8 % (ref 11.0–15.0)
WBC: 6 10*3/uL (ref 3.8–10.8)

## 2021-05-16 ENCOUNTER — Other Ambulatory Visit: Payer: Self-pay | Admitting: Cardiology

## 2021-05-16 NOTE — Telephone Encounter (Signed)
Prescription refill request for Eliquis received. Indication: Atrial Fib Last office visit: 02/09/21  [name] MD Scr: 0.79 on 04/25/21 Age: 80 Weight: 82.7kg  Based on above findings Eliquis 5mg  twice daily is the appropriate dose.  Refill approved.

## 2021-06-19 ENCOUNTER — Other Ambulatory Visit: Payer: Self-pay | Admitting: Cardiology

## 2021-08-07 ENCOUNTER — Other Ambulatory Visit (HOSPITAL_COMMUNITY): Payer: Self-pay | Admitting: Family Medicine

## 2021-08-07 DIAGNOSIS — Z1231 Encounter for screening mammogram for malignant neoplasm of breast: Secondary | ICD-10-CM

## 2021-08-18 ENCOUNTER — Other Ambulatory Visit: Payer: Self-pay

## 2021-08-18 DIAGNOSIS — I6523 Occlusion and stenosis of bilateral carotid arteries: Secondary | ICD-10-CM

## 2021-08-28 ENCOUNTER — Other Ambulatory Visit: Payer: Self-pay | Admitting: Cardiology

## 2021-09-01 ENCOUNTER — Ambulatory Visit: Payer: Medicare HMO | Admitting: Cardiology

## 2021-09-04 NOTE — Progress Notes (Signed)
?Office Note  ? ? ? ?CC:  follow up ?Requesting Provider:  Gareth Morgan, MD ? ?HPI: Monica Stafford is a 81 y.o. (05-01-1941) female who presents for follow up of carotid stenosis. Remote history of left carotid endarterectomy in 2002 by Dr. Hart Rochester.    ?The patient denies symptoms of TIA, amaurosis, or stroke.  She does not have any lower extremity claudication, rest pain or tissue loss. She works out 2 x per week at the [employer]. She also works part time job at [employer]. ? ?The pt is on a statin for cholesterol management.  ?The pt is not on a daily aspirin.   Other AC:  Eliquis for afib ?The pt is on BB, CCB, ACE  for hypertension.   ?The pt is not diabetic.  ?Tobacco hx:  Never ? ?Past Medical History:  ?Diagnosis Date  ? Arthritis   ? back  all over  ? Hyperlipidemia   ? Hypertension   ? Peripheral vascular disease (HCC)   ? Pneumonia   ?  50 years  ago  ? Sleep apnea   ?   study but dont wear   mask  ? ? ?Past Surgical History:  ?Procedure Laterality Date  ? ABDOMINAL HYSTERECTOMY  1998  ? [facility]  ? CARDIAC CATHETERIZATION    ? CATARACT EXTRACTION W/PHACO  12/10/2011  ? Procedure: CATARACT EXTRACTION PHACO AND INTRAOCULAR LENS PLACEMENT (IOC);  Surgeon: Gemma Payor, MD;  Location: [facility];  Service: Ophthalmology;  Laterality: Right;  CDE:10.40  ? CATARACT EXTRACTION W/PHACO  01/07/2012  ? Procedure: CATARACT EXTRACTION PHACO AND INTRAOCULAR LENS PLACEMENT (IOC);  Surgeon: Gemma Payor, MD;  Location: [facility];  Service: Ophthalmology;  Laterality: Left;  CDE 8.38  ? CORONARY ARTERY BYPASS GRAFT  12/23/2000  ? [facility]  ? LAPAROSCOPIC APPENDECTOMY N/A 02/27/2016  ? Procedure: APPENDECTOMY LAPAROSCOPIC;  Surgeon: Franky Macho, MD;  Location: [facility];  Service: General;  Laterality: N/A;  ? ? ?Social History  ? ?Socioeconomic History  ? Marital status: Divorced  ?  Spouse name: Not on file  ? Number of children: Not on file  ? Years of education: Not on file  ? Highest education level: Not on  file  ?Occupational History  ? Not on file  ?Tobacco Use  ? Smoking status: Never  ?  Passive exposure: Never  ? Smokeless tobacco: Never  ?Vaping Use  ? Vaping Use: Never used  ?Substance and Sexual Activity  ? Alcohol use: No  ?  Alcohol/week: 0.0 standard drinks  ? Drug use: No  ? Sexual activity: Not Currently  ?Other Topics Concern  ? Not on file  ?Social History Narrative  ? Not on file  ? ?Social Determinants of Health  ? ?Financial Resource Strain: Not on file  ?Food Insecurity: Not on file  ?Transportation Needs: Not on file  ?Physical Activity: Not on file  ?Stress: Not on file  ?Social Connections: Not on file  ?Intimate Partner Violence: Not on file  ? ? ?Family History  ?Problem Relation Age of Onset  ? Heart disease Father   ?     before age 63  ? Heart attack Father   ? Parkinson's disease Sister   ? Colitis Sister   ? ? ?Current Outpatient Medications  ?Medication Sig Dispense Refill  ? acetaminophen (TYLENOL) 500 MG tablet Take 1,000 mg by mouth 3 (three) times daily as needed for mild pain or moderate pain.    ? alendronate (FOSAMAX) 70  MG tablet Take 70 mg by mouth once a week.    ? ALPRAZolam (XANAX) 1 MG tablet Take 0.25-1 mg by mouth 3 (three) times daily as needed for anxiety.     ? amLODipine (NORVASC) 5 MG tablet Take 1 tablet by mouth once daily 90 tablet 1  ? apixaban (ELIQUIS) 5 MG TABS tablet Take 1 tablet by mouth twice daily 60 tablet 5  ? atorvastatin (LIPITOR) 40 MG tablet Take 40 mg by mouth daily.    ? Calcium Carbonate-Vitamin D 600-400 MG-UNIT tablet Take 1 tablet by mouth 3 (three) times daily.     ? chlorthalidone (HYGROTON) 25 MG tablet Take 1 tablet by mouth once daily 90 tablet 0  ? estradiol (ESTRACE) 0.1 MG/GM vaginal cream Place 1 g vaginally every 30 (thirty) days.     ? fish oil-omega-3 fatty acids 1000 MG capsule Take 1 g by mouth daily.     ? KLOR-CON M20 20 MEQ tablet Take 20-40 mEq by mouth as directed. Take 40 meq for 2 days then take 20 meq-alternating    ?  lisinopril (ZESTRIL) 10 MG tablet Take 10 mg by mouth 2 (two) times daily.    ? metoprolol tartrate (LOPRESSOR) 25 MG tablet Take 1/2 (one-half) tablet by mouth twice daily 90 tablet 0  ? Multiple Vitamin (MULTIVITAMIN WITH MINERALS) TABS Take 1 tablet by mouth daily.    ? nitroGLYCERIN (NITROSTAT) 0.4 MG SL tablet Place 1 tablet (0.4 mg total) under the tongue every 5 (five) minutes as needed for chest pain. 25 tablet 1  ? raloxifene (EVISTA) 60 MG tablet Take 60 mg by mouth every other day.     ? ?No current facility-administered medications for this visit.  ? ? ?Allergies  ?Allergen Reactions  ? Sulfonamide Derivatives Itching  ? ? ? ?REVIEW OF SYSTEMS:  ?  denotes positive finding,  denotes negative finding ?Cardiac  Comments:  ?Chest pain or chest pressure:    ?Shortness of breath upon exertion:    ?Short of breath when lying flat:    ?Irregular heart rhythm:    ?    ?Vascular    ?Pain in calf, thigh, or hip brought on by ambulation:    ?Pain in feet at night that wakes you up from your sleep:     ?Blood clot in your veins:    ?Leg swelling:  X   ?    ?Pulmonary    ?Oxygen at home:    ?Productive cough:     ?Wheezing:     ?    ?Neurologic    ?Sudden weakness in arms or legs:     ?Sudden numbness in arms or legs:     ?Sudden onset of difficulty speaking or slurred speech:    ?Temporary loss of vision in one eye:     ?Problems with dizziness:     ?    ?Gastrointestinal    ?Blood in stool:     ?Vomited blood:     ?    ?Genitourinary    ?Burning when urinating:     ?Blood in urine:    ?    ?Psychiatric    ?Major depression:     ?    ?Hematologic    ?Bleeding problems:    ?Problems with blood clotting too easily:    ?    ?Skin    ?Rashes or ulcers:    ?    ?Constitutional    ?Fever or chills:    ? ? ?  PHYSICAL EXAMINATION: ? ?Vitals:  ? 09/05/21 1048 09/05/21 1052  ?BP: (!) 147/68 117/60  ?Pulse: (!) 50   ?Resp: 20   ?Temp: 98 ?F (36.7 ?C)   ?TempSrc: Temporal   ?SpO2: 99%   ?Weight: 182 lb 4.8 oz (82.7 kg)    ?Height: 5\' 8"  (1.727 m)   ? ? ?General:  WDWN in NAD; vital signs documented above ?Gait: Normal ?HENT: WNL, normocephalic ?Pulmonary: normal non-labored breathing , without wheezing ?Cardiac:  HR, without  Murmurs without carotid bruit ?Abdomen: soft, NT, no masses ?Vascular Exam/Pulses:2+ radial, Dp and PT pulses bilaterally. Feet warm and well perfused ?Musculoskeletal: no muscle wasting or atrophy  ?Neurologic: A&O X 3;  No focal weakness or paresthesias are detected ?Psychiatric:  The pt has Normal affect. ? ? ?Non-Invasive Vascular Imaging:   ?VAS Carotid Duplex: 09/05/21 ?Summary:  ?Right Carotid: Velocities in the right ICA are consistent with a 1-39% stenosis.  ? ?Left Carotid: Velocities in the left ICA are consistent with a 1-39% stenosis. CEA patent.  ? ?Vertebrals:  Bilateral vertebral arteries demonstrate antegrade flow.  ?Subclavians: Normal flow hemodynamics were seen in bilateral subclavian arteries.  ? ? ?ASSESSMENT/PLAN:: 81 y.o. female here for follow up for carotid stenosis.She has no attributable symptoms. Her duplex today shows bilateral 1-39% stenosis,which is unchanged from her prior duplex. She has normal flow in her vertebral and subclavian arteries bilaterally ?- Continue Statin and Eliquis ?- Encourage her to continue her exercise and adequate blood pressure control ?- Reviewed signs and symptoms of stroke/ TIA with her and she understands should this occur to seek immediate medical attention ?-She will follow up in 2 years with carotid duplex ? ? ?94, PA-C ?[facility] ?[phone] ? ?Clinic MD:   [phone] ?

## 2021-09-05 ENCOUNTER — Ambulatory Visit (HOSPITAL_COMMUNITY)
Admission: RE | Admit: 2021-09-05 | Discharge: 2021-09-05 | Disposition: A | Discharge status: Home / Self Care | Payer: Medicare HMO | Source: Ambulatory Visit | Attending: Vascular Surgery | Admitting: Vascular Surgery

## 2021-09-05 ENCOUNTER — Ambulatory Visit: Payer: Medicare HMO | Admitting: Physician Assistant

## 2021-09-05 ENCOUNTER — Other Ambulatory Visit: Payer: Self-pay

## 2021-09-05 VITALS — BP 117/60 | HR 50 | Temp 98.0°F | Resp 20 | Ht 68.0 in | Wt 182.3 lb

## 2021-09-05 DIAGNOSIS — I6523 Occlusion and stenosis of bilateral carotid arteries: Secondary | ICD-10-CM

## 2021-09-07 ENCOUNTER — Ambulatory Visit (HOSPITAL_COMMUNITY): Payer: Medicare HMO

## 2021-09-08 ENCOUNTER — Other Ambulatory Visit: Payer: Self-pay

## 2021-09-08 ENCOUNTER — Ambulatory Visit (HOSPITAL_COMMUNITY)
Admission: RE | Admit: 2021-09-08 | Discharge: 2021-09-08 | Disposition: A | Discharge status: Home / Self Care | Payer: Medicare HMO | Source: Ambulatory Visit | Attending: Family Medicine | Admitting: Family Medicine

## 2021-09-08 DIAGNOSIS — Z1231 Encounter for screening mammogram for malignant neoplasm of breast: Secondary | ICD-10-CM | POA: Insufficient documentation

## 2021-09-11 ENCOUNTER — Other Ambulatory Visit: Payer: Self-pay | Admitting: Cardiology

## 2021-09-18 ENCOUNTER — Other Ambulatory Visit: Payer: Self-pay | Admitting: Cardiology

## 2021-09-19 ENCOUNTER — Telehealth: Payer: Self-pay

## 2021-09-19 MED ORDER — METOPROLOL TARTRATE 25 MG PO TABS: ORAL_TABLET | ORAL | 1 refills | Status: DC

## 2021-09-19 NOTE — Telephone Encounter (Signed)
Medication refill request for Metoprolol Tartrate 25 mg tablets approved and sent to [facility].  

## 2021-10-24 ENCOUNTER — Ambulatory Visit: Payer: Medicare HMO | Admitting: Cardiology

## 2021-10-24 ENCOUNTER — Encounter: Payer: Self-pay | Admitting: Cardiology

## 2021-10-24 ENCOUNTER — Encounter: Payer: Self-pay | Admitting: *Deleted

## 2021-10-24 VITALS — BP 136/70 | HR 56 | Ht 68.0 in | Wt 182.0 lb

## 2021-10-24 DIAGNOSIS — E782 Mixed hyperlipidemia: Secondary | ICD-10-CM | POA: Diagnosis not present

## 2021-10-24 DIAGNOSIS — I6523 Occlusion and stenosis of bilateral carotid arteries: Secondary | ICD-10-CM

## 2021-10-24 DIAGNOSIS — D6869 Other thrombophilia: Secondary | ICD-10-CM

## 2021-10-24 DIAGNOSIS — I2510 Atherosclerotic heart disease of native coronary artery without angina pectoris: Secondary | ICD-10-CM

## 2021-10-24 DIAGNOSIS — R600 Localized edema: Secondary | ICD-10-CM

## 2021-10-24 DIAGNOSIS — I4891 Unspecified atrial fibrillation: Secondary | ICD-10-CM

## 2021-10-24 DIAGNOSIS — I10 Essential (primary) hypertension: Secondary | ICD-10-CM | POA: Diagnosis not present

## 2021-10-24 NOTE — Patient Instructions (Addendum)

## 2021-10-24 NOTE — Progress Notes (Signed)
? ? ? ?Clinical Summary ?Monica Stafford is a 81 y.o.female seen today for follow up of the following medical problems.  ?  ?  ? 1. CAD ?- history of CABG in 2002 (LIMA-LAD,RIMA-RCA,SVG-OM1,SVG-D2). LVEF 69% by LVgram in 2002 ? - 03/2017 admit with chest pain. Troponins negative. Nuclear stress without ischemia, low risk ?- 03/2017 echo LVEF 123456, grade I diastolic dysfunction ?- no beta blocker due to bradycardia  ? ?-no recent chest pains, no SOB/DOE ?- compliant with meds ?  ?  ? ?2. Carotid stenosis ?- history of left CEA 12/2000 ?-01/2019 carotid US RICA 123456, [id number] placque LICA with patent CEA ?- followed by vascualr ?  ? 123456 RICA 123456, LICA patent CEA ? 0000000 1-39% bilateral disease ?  ?3. Hyperlipidemia ?-labs per pcp ?  ?4.HTN ?-she is compliant with meds ?  ?5. Afib  ?- no palpitations ?- no bleeding on eliquis ?  ?6. LE edema ?- started after norvasc was increased to 10mg  daily back in 10/2015 ?- norvasc was decreased to 5mg  daily which improved swelling ?- mild chronic edema. Previously a fairly benign echo, suspect likely component of venous insufficiency ?  ? -chronic LE edema unchanged ?  ? 7. Syncope ?- presented 03/2017 with chest pain and syncope ?- syncope episode occurred after becoming nauseated ?- labs suggested she was dehydrated. She had also taken a nitroglycerin while standing around that time.  ?  ?- no recent issues ?  ?  ?  ?  ?  ?SH:  ?Works at [employer].Marland Kitchen  ?Son passed away in May 08, 2023 from [place].  ? ? ?Past Medical History:  ?Diagnosis Date  ? Arthritis   ? back  all over  ? Hyperlipidemia   ? Hypertension   ? Peripheral vascular disease ([place])   ? Pneumonia   ?  50 years  ago  ? Sleep apnea   ?   study but dont wear   mask  ? ? ? ?Allergies  ?Allergen Reactions  ? Sulfonamide Derivatives Itching  ? ? ? ?Current Outpatient Medications  ?Medication Sig Dispense Refill  ? acetaminophen (TYLENOL) 500 MG tablet Take 1,000 mg by mouth 3 (three) times daily as needed for mild pain or moderate  pain.    ? alendronate (FOSAMAX) 70 MG tablet Take 70 mg by mouth once a week.    ? ALPRAZolam (XANAX) 1 MG tablet Take 0.25-1 mg by mouth 3 (three) times daily as needed for anxiety.     ? amLODipine (NORVASC) 5 MG tablet Take 1 tablet by mouth once daily 90 tablet 1  ? apixaban (ELIQUIS) 5 MG TABS tablet Take 1 tablet by mouth twice daily 60 tablet 5  ? atorvastatin (LIPITOR) 40 MG tablet Take 40 mg by mouth daily.    ? Calcium Carbonate-Vitamin D 600-400 MG-UNIT tablet Take 1 tablet by mouth 3 (three) times daily.     ? chlorthalidone (HYGROTON) 25 MG tablet Take 1 tablet by mouth once daily 90 tablet 0  ? estradiol (ESTRACE) 0.1 MG/GM vaginal cream Place 1 g vaginally every 30 (thirty) days.     ? fish oil-omega-3 fatty acids 1000 MG capsule Take 1 g by mouth daily.     ? KLOR-CON M20 20 MEQ tablet Take 20-40 mEq by mouth as directed. Take 40 meq for 2 days then take 20 meq-alternating    ? lisinopril (ZESTRIL) 10 MG tablet Take 10 mg by mouth 2 (two) times daily.    ? metoprolol tartrate (LOPRESSOR) 25 MG  tablet Take 1/2 (one-half) tablet by mouth twice daily 90 tablet 1  ? Multiple Vitamin (MULTIVITAMIN WITH MINERALS) TABS Take 1 tablet by mouth daily.    ? nitroGLYCERIN (NITROSTAT) 0.4 MG SL tablet Place 1 tablet (0.4 mg total) under the tongue every 5 (five) minutes as needed for chest pain. 25 tablet 1  ? raloxifene (EVISTA) 60 MG tablet Take 60 mg by mouth every other day.     ? ?No current facility-administered medications for this visit.  ? ? ? ?Past Surgical History:  ?Procedure Laterality Date  ? ABDOMINAL HYSTERECTOMY  1998  ? [facility]  ? CARDIAC CATHETERIZATION    ? CATARACT EXTRACTION W/PHACO  12/10/2011  ? Procedure: CATARACT EXTRACTION PHACO AND INTRAOCULAR LENS PLACEMENT (IOC);  Surgeon: Tonny Thurl Boen, MD;  Location: [facility];  Service: Ophthalmology;  Laterality: Right;  CDE:10.40  ? CATARACT EXTRACTION W/PHACO  01/07/2012  ? Procedure: CATARACT EXTRACTION PHACO AND INTRAOCULAR LENS  PLACEMENT (IOC);  Surgeon: Tonny Adeleigh Barletta, MD;  Location: [facility];  Service: Ophthalmology;  Laterality: Left;  CDE 8.38  ? CORONARY ARTERY BYPASS GRAFT  12/23/2000  ? [place]  ? LAPAROSCOPIC APPENDECTOMY N/A 02/27/2016  ? Procedure: APPENDECTOMY LAPAROSCOPIC;  Surgeon: Aviva Signs, MD;  Location: [facility];  Service: General;  Laterality: N/A;  ? ? ? ?Allergies  ?Allergen Reactions  ? Sulfonamide Derivatives Itching  ? ? ? ? ?Family History  ?Problem Relation Age of Onset  ? Heart disease Father   ?     before age 44  ? Heart attack Father   ? Parkinson's disease Sister   ? Colitis Sister   ? ? ? ?Social History ?Monica Stafford reports that she has never smoked. She has never been exposed to tobacco smoke. She has never used smokeless tobacco. ?Monica Stafford reports no history of alcohol use. ? ? ?Review of Systems ?CONSTITUTIONAL: No weight loss, fever, chills, weakness or fatigue.  ?HEENT: Eyes: No visual loss, blurred vision, double vision or yellow sclerae.No hearing loss, sneezing, congestion, runny nose or sore throat.  ?SKIN: No rash or itching.  ?CARDIOVASCULAR: per hpi ?RESPIRATORY: No shortness of breath, cough or sputum.  ?GASTROINTESTINAL: No anorexia, nausea, vomiting or diarrhea. No abdominal pain or blood.  ?GENITOURINARY: No burning on urination, no polyuria ?NEUROLOGICAL: No headache, dizziness, syncope, paralysis, ataxia, numbness or tingling in the extremities. No change in bowel or bladder control.  ?MUSCULOSKELETAL: No muscle, back pain, joint pain or stiffness.  ?LYMPHATICS: No enlarged nodes. No history of splenectomy.  ?PSYCHIATRIC: No history of depression or anxiety.  ?ENDOCRINOLOGIC: No reports of sweating, cold or heat intolerance. No polyuria or polydipsia.  ?. ? ? ?Physical Examination ?Today's Vitals  ? 10/24/21 0915  ?BP: 136/70  ?Pulse: (!) 56  ?SpO2: 98%  ?Weight: 182 lb (82.6 kg)  ?Height: 5\' 8"  (1.727 m)  ? ?Body mass index is 27.67 kg/m?. ? ?Gen: resting comfortably, no acute  distress ?HEENT: no scleral icterus, pupils equal round and reactive, no palptable cervical adenopathy,  ?CV: irreg, no m/r/g no jvd ?Resp: Clear to auscultation bilaterally ?GI: abdomen is soft, non-tender, non-distended, normal bowel sounds, no hepatosplenomegaly ?MSK: extremities are warm, no edema.  ?Skin: warm, no rash ?Neuro:  no focal deficits ?Psych: appropriate affect ? ? ?Diagnostic Studies ? ?12/2000 Cath ?PROCEDURES PERFORMED: ?1. Left heart catheterization. ?2. Selective coronary arteriography. ?3. Selective left ventriculography. ?4. Subclavian angiography. ?  ?DESCRIPTION OF PROCEDURE:  The procedure was performed from the right femoral ?artery using 6-French catheters.  She tolerated the procedure well without ?complications.  She did have elevated blood pressures.  She was given two ?doses of labetalol 20 mg IV, plus an additional dose of 5 mg of intravenous ?metoprolol.  There were no complications from the procedure and she was taken ?to the holding area in satisfactory clinical condition. ?  ?HEMODYNAMIC DATA:  Central aortic pressure was 190/83.  LV pressure was ?190/20.  There was no gradient on pullback across the aortic valve. ?  ?ANGIOGRAPHIC DATA: ?1. Left ventriculography was performed in the RAO projection.  Overall ?   ventricular systolic function was normal.  No segmental abnormalities ?   or contraction were identified.  There did not appear to be significant ?   mitral regurgitation.  Ejection fraction was calculated at 69%. ?2. Subclavian angiography revealed a patent subclavian artery, as well as ?   an internal mammary artery.  The internal mammary appeared to be suitable ?   for grafting. ?3. The left main coronary artery was free of critical disease. ?4. The left anterior descending artery coursed to the apex where it wrapped ?   the apical tip.  The LAD had about a 50% area of narrowing beyond the ?   origin of the first diagonal.  The first diagonal itself had about 50% ?    narrowing.  Distal to this, there was a segmental area of plaquing leading ?   into an approximate 90% stenosis just after the large anterolateral Livvy Spilman. ?   This anterolateral Wetona Viramontes itself was a large caliber ve

## 2021-11-15 ENCOUNTER — Other Ambulatory Visit: Payer: Self-pay | Admitting: Cardiology

## 2021-11-15 NOTE — Telephone Encounter (Signed)
Prescription refill request for Eliquis received. Indication: Atrial Fib Last office visit: 10/24/21  [name] MD Scr: 0.79 on 04/25/21 Age: 81 Weight: 82.6kg  Based on above findings Eliquis 5mg  twice daily is the appropriate dose.  Refill approved.

## 2021-12-18 ENCOUNTER — Other Ambulatory Visit (HOSPITAL_COMMUNITY): Payer: Self-pay | Admitting: Nurse Practitioner

## 2021-12-18 DIAGNOSIS — Z780 Asymptomatic menopausal state: Secondary | ICD-10-CM

## 2021-12-25 ENCOUNTER — Other Ambulatory Visit: Payer: Self-pay | Admitting: Family Medicine

## 2021-12-25 ENCOUNTER — Other Ambulatory Visit: Payer: Self-pay | Admitting: Cardiology

## 2021-12-25 ENCOUNTER — Telehealth: Payer: Self-pay | Admitting: Cardiology

## 2021-12-25 DIAGNOSIS — M5440 Lumbago with sciatica, unspecified side: Secondary | ICD-10-CM

## 2021-12-25 NOTE — Telephone Encounter (Signed)
   Pre-operative Risk Assessment    Patient Name: Monica Stafford  DOB: [birth year] MRN: [id number]      Request for Surgical Clearance    Procedure:   Lumbar Epidural   Date of Surgery:  Clearance TBD                                 Surgeon:  not indicated  Surgeon's Group or Practice Name:  [facility]  Phone number:  [phone] Fax number:  [phone]   Type of Clearance Requested:   - Pharmacy:  Hold Apixaban (Eliquis) 2 days   Type of Anesthesia:  Not Indicated   Additional requests/questions:    Lajuana Matte   12/25/2021, 3:48 PM

## 2021-12-26 NOTE — Telephone Encounter (Signed)
Patient with diagnosis of afib on Eliquis for anticoagulation.    Procedure: lumbar epidural Date of procedure: TBD  CHA2DS2-VASc Score = 5  This indicates a 7.2% annual risk of stroke. The patient's score is based upon: CHF History: 0 HTN History: 1 Diabetes History: 0 Stroke History: 0 Vascular Disease History: 1 Age Score: 2 Gender Score: 1   CrCl 47mL/min Platelet count 132K  Per office protocol, patient can hold Eliquis for 3 days prior to procedure.    **This guidance is not considered finalized until pre-operative APP has relayed final recommendations.**

## 2021-12-26 NOTE — Telephone Encounter (Signed)
Left message for the pt to call for tele visit for pre op appt.

## 2021-12-26 NOTE — Telephone Encounter (Signed)
Primary Cardiologist:Branch, Christiane Ha, MD   Preoperative team, please contact this patient and set up a phone call appointment for further preoperative risk assessment. Please obtain consent and complete medication review. Thank you for your help.   I confirm that guidance regarding antiplatelet and oral anticoagulation therapy has been completed and, if necessary, noted below.   Levi Aland, NP  12/26/2021, 11:29 AM

## 2021-12-27 ENCOUNTER — Telehealth: Payer: Self-pay

## 2021-12-27 ENCOUNTER — Ambulatory Visit (HOSPITAL_COMMUNITY)
Admission: RE | Admit: 2021-12-27 | Discharge: 2021-12-27 | Disposition: A | Discharge status: Home / Self Care | Payer: Medicare HMO | Source: Ambulatory Visit | Attending: Nurse Practitioner | Admitting: Nurse Practitioner

## 2021-12-27 DIAGNOSIS — Z780 Asymptomatic menopausal state: Secondary | ICD-10-CM | POA: Diagnosis present

## 2021-12-27 NOTE — Telephone Encounter (Signed)
Patient scheduled for telehealth visit on 01/02/22 for preop clearance. Med rec and consent done.

## 2021-12-27 NOTE — Telephone Encounter (Signed)
Pt is returning call.  

## 2021-12-27 NOTE — Telephone Encounter (Signed)
  Patient Consent for Virtual Visit         Monica Stafford has provided verbal consent on 12/27/2021 for a virtual visit (video or telephone).   CONSENT FOR VIRTUAL VISIT FOR:  Monica Stafford  By participating in this virtual visit I agree to the following:  I hereby voluntarily request, consent and authorize [facility] and its employed or contracted physicians, physician assistants, nurse practitioners or other licensed health care professionals (the Practitioner), to provide me with telemedicine health care services (the "Services") as deemed necessary by the treating Practitioner. I acknowledge and consent to receive the Services by the Practitioner via telemedicine. I understand that the telemedicine visit will involve communicating with the Practitioner through live audiovisual communication technology and the disclosure of certain medical information by electronic transmission. I acknowledge that I have been given the opportunity to request an in-person assessment or other available alternative prior to the telemedicine visit and am voluntarily participating in the telemedicine visit.  I understand that I have the right to withhold or withdraw my consent to the use of telemedicine in the course of my care at any time, without affecting my right to future care or treatment, and that the Practitioner or I may terminate the telemedicine visit at any time. I understand that I have the right to inspect all information obtained and/or recorded in the course of the telemedicine visit and may receive copies of available information for a reasonable fee.  I understand that some of the potential risks of receiving the Services via telemedicine include:  Delay or interruption in medical evaluation due to technological equipment failure or disruption; Information transmitted may not be sufficient (e.g. poor resolution of images) to allow for appropriate medical decision making by the Practitioner;  and/or  In rare instances, security protocols could fail, causing a breach of personal health information.  Furthermore, I acknowledge that it is my responsibility to provide information about my medical history, conditions and care that is complete and accurate to the best of my ability. I acknowledge that Practitioner's advice, recommendations, and/or decision may be based on factors not within their control, such as incomplete or inaccurate data provided by me or distortions of diagnostic images or specimens that may result from electronic transmissions. I understand that the practice of medicine is not an exact science and that Practitioner makes no warranties or guarantees regarding treatment outcomes. I acknowledge that a copy of this consent can be made available to me via my patient portal [facility] MyChart), or I can request a printed copy by calling the office of [facility].    I understand that my insurance will be billed for this visit.   I have read or had this consent read to me. I understand the contents of this consent, which adequately explains the benefits and risks of the Services being provided via telemedicine.  I have been provided ample opportunity to ask questions regarding this consent and the Services and have had my questions answered to my satisfaction. I give my informed consent for the services to be provided through the use of telemedicine in my medical care

## 2022-01-02 ENCOUNTER — Ambulatory Visit (INDEPENDENT_AMBULATORY_CARE_PROVIDER_SITE_OTHER): Payer: Medicare HMO | Admitting: Physician Assistant

## 2022-01-02 DIAGNOSIS — Z01810 Encounter for preprocedural cardiovascular examination: Secondary | ICD-10-CM | POA: Diagnosis not present

## 2022-01-02 NOTE — Progress Notes (Signed)
Virtual Visit via Telephone Note   Because of Monica Stafford's co-morbid illnesses, she is at least at moderate risk for complications without adequate follow up.  This format is felt to be most appropriate for this patient at this time.  The patient did not have access to video technology/had technical difficulties with video requiring transitioning to audio format only (telephone).  All issues noted in this document were discussed and addressed.  No physical exam could be performed with this format.  Please refer to the patient's chart for her consent to telehealth for [facility].  Evaluation Performed:  Preoperative cardiovascular risk assessment _____________   Date:  01/02/2022   Patient ID:  Monica Stafford, DOB [birth year], MRN [id number] Patient Location:  Home Provider location:   Office  Primary Care Provider:  Gareth Morgan, MD Primary Cardiologist:  Dina Rich, MD  Chief Complaint / Patient Profile   81 y.o. y/o female with a h/o of CAD s/p CABG, carotid artery dz s/p left CEA 2002, PAF, HTN and HLD who is pending lumbar epidural and presents today for telephonic preoperative cardiovascular risk assessment.  Past Medical History    Past Medical History:  Diagnosis Date   Arthritis    back  all over   Hyperlipidemia    Hypertension    Peripheral vascular disease ([facility])    Pneumonia     50 years  ago   Sleep apnea      study but dont wear   mask   Past Surgical History:  Procedure Laterality Date   ABDOMINAL HYSTERECTOMY  1998   [facility]   CARDIAC CATHETERIZATION     CATARACT EXTRACTION W/PHACO  12/10/2011   Procedure: CATARACT EXTRACTION PHACO AND INTRAOCULAR LENS PLACEMENT (IOC);  Surgeon: Gemma Payor, MD;  Location: [facility];  Service: Ophthalmology;  Laterality: Right;  CDE:10.40   CATARACT EXTRACTION W/PHACO  01/07/2012   Procedure: CATARACT EXTRACTION PHACO AND INTRAOCULAR LENS PLACEMENT (IOC);  Surgeon: Gemma Payor, MD;   Location: [facility];  Service: Ophthalmology;  Laterality: Left;  CDE 8.38   CORONARY ARTERY BYPASS GRAFT  12/23/2000   [facility]   LAPAROSCOPIC APPENDECTOMY N/A 02/27/2016   Procedure: APPENDECTOMY LAPAROSCOPIC;  Surgeon: Franky Macho, MD;  Location: [facility];  Service: General;  Laterality: N/A;    Allergies  Allergies  Allergen Reactions   Sulfonamide Derivatives Itching    History of Present Illness    Monica Stafford is a 81 y.o. female who presents via audio/video conferencing for a telehealth visit today.  Pt was last seen in cardiology clinic on 10/24/21 by Dr. Wyline Mood.  At that time Monica Stafford was doing well except mild LE edema which felt likely due to venous insufficiency.  The patient is now pending procedure as outlined above. Since her last visit, she well. Does light exercise without any problem. The patient denies nausea, vomiting, fever, chest pain, palpitations, shortness of breath, orthopnea, PND, dizziness, syncope, cough, congestion, abdominal pain, hematochezia, melena, lower extremity edema.    Home Medications    Prior to Admission medications   Medication Sig Start Date End Date Taking? Authorizing Provider  acetaminophen (TYLENOL) 500 MG tablet Take 1,000 mg by mouth 3 (three) times daily as needed for mild pain or moderate pain.    [provider]  alendronate (FOSAMAX) 70 MG tablet Take 70 mg by mouth once a week. 06/27/21   [provider]  ALPRAZolam Prudy Feeler) 1 MG tablet Take 0.25-1 mg by  mouth 3 (three) times daily as needed for anxiety.  10/18/16   [provider]  amLODipine (NORVASC) 5 MG tablet Take 1 tablet by mouth once daily 08/28/21   Antoine Poche, MD  apixaban Everlene Balls) 5 MG TABS tablet Take 1 tablet by mouth twice daily 11/15/21   Antoine Poche, MD  atorvastatin (LIPITOR) 40 MG tablet Take 40 mg by mouth daily. 08/06/19   [provider]  Calcium Carbonate-Vitamin D 600-400 MG-UNIT tablet Take 1 tablet  by mouth 3 (three) times daily.     [provider]  chlorthalidone (HYGROTON) 25 MG tablet Take 1 tablet by mouth once daily 09/18/21   Antoine Poche, MD  estradiol (ESTRACE) 0.1 MG/GM vaginal cream Place 1 g vaginally every 30 (thirty) days.     [provider]  fish oil-omega-3 fatty acids 1000 MG capsule Take 1 g by mouth daily.     [provider]  KLOR-CON M20 20 MEQ tablet Take 20-40 mEq by mouth as directed. Take 40 meq for 2 days then take 20 meq-alternating 02/25/17   [provider]  lisinopril (ZESTRIL) 10 MG tablet Take 10 mg by mouth 2 (two) times daily. 11/21/20   [provider]  metoprolol tartrate (LOPRESSOR) 25 MG tablet Take 1/2 (one-half) tablet by mouth twice daily 09/19/21   Antoine Poche, MD  Multiple Vitamin (MULTIVITAMIN WITH MINERALS) TABS Take 1 tablet by mouth daily.    [provider]  nitroGLYCERIN (NITROSTAT) 0.4 MG SL tablet Place 1 tablet (0.4 mg total) under the tongue every 5 (five) minutes as needed for chest pain. 03/20/17   [facility], Roe Rutherford, PA  raloxifene (EVISTA) 60 MG tablet Take 60 mg by mouth every other day.     [provider]    Physical Exam    Vital Signs:  Monica Stafford does not have vital signs available for review today.  Given telephonic nature of communication, physical exam is limited. AAOx3. NAD. Normal affect.  Speech and respirations are unlabored.  Accessory Clinical Findings    None  Assessment & Plan    1.  Preoperative Cardiovascular Risk Assessment: Given past medical history and time since last visit, based on ACC/AHA guidelines, Monica Stafford would be at acceptable risk for the planned procedure without further cardiovascular testing.   Per office protocol, patient can hold Eliquis for 3 days prior to procedure.    I will route this recommendation to the requesting party via Epic fax function and remove from pre-op pool.  Please call with  questions.  Time:   Today, I have spent 8 minutes with the patient with telehealth technology discussing medical history, symptoms, and management plan.     [place], [place]  01/02/2022, 9:11 AM

## 2022-01-09 ENCOUNTER — Ambulatory Visit
Admission: RE | Admit: 2022-01-09 | Discharge: 2022-01-09 | Disposition: A | Discharge status: Home / Self Care | Payer: Medicare HMO | Source: Ambulatory Visit | Attending: Family Medicine | Admitting: Family Medicine

## 2022-01-09 DIAGNOSIS — M5440 Lumbago with sciatica, unspecified side: Secondary | ICD-10-CM

## 2022-01-09 MED ORDER — IOPAMIDOL (ISOVUE-M 200) INJECTION 41%
1.0000 mL | Freq: Once | INTRAMUSCULAR | Status: AC
  Administered 2022-01-09: 1 mL via EPIDURAL

## 2022-01-09 MED ORDER — METHYLPREDNISOLONE ACETATE 40 MG/ML INJ SUSP (RADIOLOG
80.0000 mg | Freq: Once | INTRAMUSCULAR | Status: AC
  Administered 2022-01-09: 80 mg via EPIDURAL

## 2022-01-09 NOTE — Discharge Instructions (Signed)

## 2022-01-10 ENCOUNTER — Other Ambulatory Visit: Payer: Self-pay | Admitting: Cardiology

## 2022-02-26 ENCOUNTER — Telehealth: Payer: Self-pay | Admitting: Cardiology

## 2022-02-26 NOTE — Telephone Encounter (Signed)
Pt c/o Shortness Of Breath: STAT if SOB developed within the last 24 hours or pt is noticeably SOB on the phone  1. Are you currently SOB (can you hear that pt is SOB on the phone)? Not at this time  2. How long have you been experiencing SOB?  For  2or 3 months- seems yo be getting worse  3. Are you SOB when sitting or when up moving around? Moving around and bending over  4. Are you currently experiencing any other symptoms? No energy at all- apteint wanted to be seen- I made her an appointment with Randall An on Thursday- Please call to evaluate

## 2022-02-26 NOTE — Telephone Encounter (Signed)
No answer

## 2022-02-28 NOTE — Telephone Encounter (Signed)
Patient states that for the last few weeks, she has been feeling short-winded and her legs feel like she is walking on rubber. States that she does not have the energy to do anything. Denies chest pains and dizziness. Verified medications with the patient. Informed patient that if she has any new or worsening symptoms to call 911 or proceed to the nearest ER. Appointment scheduled to see Randall An Thursday, 9/14 in [place].

## 2022-03-01 ENCOUNTER — Ambulatory Visit: Payer: Medicare HMO | Attending: Student | Admitting: Student

## 2022-03-01 ENCOUNTER — Other Ambulatory Visit (HOSPITAL_COMMUNITY)
Admission: RE | Admit: 2022-03-01 | Discharge: 2022-03-01 | Disposition: A | Discharge status: Home / Self Care | Payer: Medicare HMO | Source: Ambulatory Visit | Attending: Student | Admitting: Student

## 2022-03-01 ENCOUNTER — Telehealth: Payer: Self-pay | Admitting: *Deleted

## 2022-03-01 ENCOUNTER — Encounter: Payer: Self-pay | Admitting: *Deleted

## 2022-03-01 ENCOUNTER — Encounter: Payer: Self-pay | Admitting: Student

## 2022-03-01 VITALS — BP 118/60 | HR 53 | Ht 68.0 in | Wt 180.8 lb

## 2022-03-01 DIAGNOSIS — R0609 Other forms of dyspnea: Secondary | ICD-10-CM

## 2022-03-01 DIAGNOSIS — I10 Essential (primary) hypertension: Secondary | ICD-10-CM | POA: Insufficient documentation

## 2022-03-01 DIAGNOSIS — R5383 Other fatigue: Secondary | ICD-10-CM | POA: Diagnosis not present

## 2022-03-01 DIAGNOSIS — I25118 Atherosclerotic heart disease of native coronary artery with other forms of angina pectoris: Secondary | ICD-10-CM

## 2022-03-01 DIAGNOSIS — I6523 Occlusion and stenosis of bilateral carotid arteries: Secondary | ICD-10-CM

## 2022-03-01 DIAGNOSIS — I340 Nonrheumatic mitral (valve) insufficiency: Secondary | ICD-10-CM

## 2022-03-01 DIAGNOSIS — Z79899 Other long term (current) drug therapy: Secondary | ICD-10-CM

## 2022-03-01 DIAGNOSIS — I4819 Other persistent atrial fibrillation: Secondary | ICD-10-CM

## 2022-03-01 LAB — CBC
HCT: 39.3 % (ref 36.0–46.0)
Hemoglobin: 13.4 g/dL (ref 12.0–15.0)
MCH: 35.5 pg — ABNORMAL HIGH (ref 26.0–34.0)
MCHC: 34.1 g/dL (ref 30.0–36.0)
MCV: 104.2 fL — ABNORMAL HIGH (ref 80.0–100.0)
Platelets: 149 10*3/uL — ABNORMAL LOW (ref 150–400)
RBC: 3.77 MIL/uL — ABNORMAL LOW (ref 3.87–5.11)
RDW: 13.3 % (ref 11.5–15.5)
WBC: 4.8 10*3/uL (ref 4.0–10.5)
nRBC: 0 % (ref 0.0–0.2)

## 2022-03-01 LAB — COMPREHENSIVE METABOLIC PANEL
ALT: 30 U/L (ref 0–44)
AST: 44 U/L — ABNORMAL HIGH (ref 15–41)
Albumin: 3.6 g/dL (ref 3.5–5.0)
Alkaline Phosphatase: 81 U/L (ref 38–126)
Anion gap: 9 (ref 5–15)
BUN: 19 mg/dL (ref 8–23)
CO2: 28 mmol/L (ref 22–32)
Calcium: 9.1 mg/dL (ref 8.9–10.3)
Chloride: 99 mmol/L (ref 98–111)
Creatinine, Ser: 1.12 mg/dL — ABNORMAL HIGH (ref 0.44–1.00)
GFR, Estimated: 49 mL/min — ABNORMAL LOW (ref >60)
Glucose, Bld: 131 mg/dL — ABNORMAL HIGH (ref 70–99)
Potassium: 2.8 mmol/L — ABNORMAL LOW (ref 3.5–5.1)
Sodium: 136 mmol/L (ref 135–145)
Total Bilirubin: 0.9 mg/dL (ref 0.3–1.2)
Total Protein: 6.8 g/dL (ref 6.5–8.1)

## 2022-03-01 LAB — TSH: TSH: 1.25 u[IU]/mL (ref 0.350–4.500)

## 2022-03-01 LAB — BRAIN NATRIURETIC PEPTIDE: B Natriuretic Peptide: 229 pg/mL — ABNORMAL HIGH (ref 0.0–100.0)

## 2022-03-01 MED ORDER — FUROSEMIDE 20 MG PO TABS: 20.0000 mg | ORAL_TABLET | Freq: Every day | ORAL | 3 refills | Status: DC

## 2022-03-01 MED ORDER — POTASSIUM CHLORIDE CRYS ER 20 MEQ PO TBCR
40.0000 meq | EXTENDED_RELEASE_TABLET | Freq: Every day | ORAL | 3 refills | Status: DC
Start: 2022-03-01 — End: 2022-03-08

## 2022-03-01 NOTE — Telephone Encounter (Signed)
-----   Message from Ellsworth Lennox, [place] sent at 03/01/2022  4:53 PM EDT ----- Notified by Bradly Bienenstock the patient was taking K-dur 20 mEq alternating with 40 mEq daily. Will have her take 60 mEq tonight then increase to 40 mEq daily. Keep plans for repeat BMET next week.

## 2022-03-01 NOTE — Progress Notes (Signed)
Cardiology Office Note    Date:  03/01/2022   ID:  Monica, Stafford 1941-05-30, MRN [id number]  PCP:  Monica Morgan, MD  Cardiologist: Monica Rich, MD    Chief Complaint  Patient presents with   Follow-up    Worsening dyspnea on exertion and fatigue    History of Present Illness:    Monica Stafford is a 81 y.o. female with past medical history of CAD (s/p CABG in 2002 with LIMA-LAD, RIMA-RCA, SVG-OM1, SVG-D2, low-risk NST in 03/2017), carotid artery stenosis (s/p L CEA in 12/2000), paroxysmal atrial fibrillation, mitral regurgitation (mild to moderate by echo in 2021), HTN, HLD and prior syncope who presents to the office today for evaluation of worsening dyspnea and fatigue.   She was last examined by Dr. Wyline Stafford in 10/2021 and denied any recent chest pain or dyspnea on exertion at that time. She had previously experienced lower extremity edema with Amlodipine 10 mg daily and this had improved with dose reduction to 5 mg daily. No changes were made to her cardiac medications at that time.  She called the office on 02/26/2022 reporting worsening fatigue and dyspnea on exertion for the past 2 to 3 months, therefore a follow-up visit was arranged for further assessment.  In talking with the patient today, she reports acutely worsening dyspnea on exertion and fatigue over the past month. She continues to work part-time at [employer] and goes to exercise classes at [employer] at least 2 days/week but notices that she is getting more short of breath with walking up steps at work or doing her exercise program. Feels like her legs weigh her down as well and she has little energy. No recent chest pain or palpitations. No specific orthopnea or PND. She does experience intermittent lower extremity edema.   Past Medical History:  Diagnosis Date   Arthritis    back  all over   CAD (coronary artery disease)    a. s/p CABG in 2002 with LIMA-LAD, RIMA-RCA, SVG-OM1, SVG-D2 b. low-risk  NST in 03/2017   Hyperlipidemia    Hypertension    PAF (paroxysmal atrial fibrillation) (HCC)    Peripheral vascular disease (HCC)    Pneumonia     50 years  ago   Sleep apnea      study but dont wear   mask    Past Surgical History:  Procedure Laterality Date   ABDOMINAL HYSTERECTOMY  1998   [facility]   CARDIAC CATHETERIZATION     CATARACT EXTRACTION W/PHACO  12/10/2011   Procedure: CATARACT EXTRACTION PHACO AND INTRAOCULAR LENS PLACEMENT (IOC);  Surgeon: Monica Payor, MD;  Location: [facility];  Service: Ophthalmology;  Laterality: Right;  CDE:10.40   CATARACT EXTRACTION W/PHACO  01/07/2012   Procedure: CATARACT EXTRACTION PHACO AND INTRAOCULAR LENS PLACEMENT (IOC);  Surgeon: Monica Payor, MD;  Location: [facility];  Service: Ophthalmology;  Laterality: Left;  CDE 8.38   CORONARY ARTERY BYPASS GRAFT  12/23/2000   [facility]   LAPAROSCOPIC APPENDECTOMY N/A 02/27/2016   Procedure: APPENDECTOMY LAPAROSCOPIC;  Surgeon: Monica Macho, MD;  Location: [facility];  Service: General;  Laterality: N/A;    Current Medications: Outpatient Medications Prior to Visit  Medication Sig Dispense Refill   acetaminophen (TYLENOL) 500 MG tablet Take 1,000 mg by mouth 3 (three) times daily as needed for mild pain or moderate pain.     alendronate (FOSAMAX) 70 MG tablet Take 70 mg by mouth once a week.     ALPRAZolam (  XANAX) 1 MG tablet Take 0.25-1 mg by mouth 3 (three) times daily as needed for anxiety.      amLODipine (NORVASC) 5 MG tablet Take 1 tablet by mouth once daily 90 tablet 1   apixaban (ELIQUIS) 5 MG TABS tablet Take 1 tablet by mouth twice daily 180 tablet 1   atorvastatin (LIPITOR) 40 MG tablet Take 40 mg by mouth daily.     Calcium Carbonate-Vitamin D 600-400 MG-UNIT tablet Take 1 tablet by mouth 3 (three) times daily.      estradiol (ESTRACE) 0.1 MG/GM vaginal cream Place 1 g vaginally every 30 (thirty) days.      fish oil-omega-3 fatty acids 1000 MG capsule Take 1 g by mouth daily.       lisinopril (ZESTRIL) 10 MG tablet Take 10 mg by mouth 2 (two) times daily.     metoprolol tartrate (LOPRESSOR) 25 MG tablet Take 1/2 (one-half) tablet by mouth twice daily 90 tablet 1   Multiple Vitamin (MULTIVITAMIN WITH MINERALS) TABS Take 1 tablet by mouth daily.     nitroGLYCERIN (NITROSTAT) 0.4 MG SL tablet Place 1 tablet (0.4 mg total) under the tongue every 5 (five) minutes as needed for chest pain. 25 tablet 1   raloxifene (EVISTA) 60 MG tablet Take 60 mg by mouth every other day.      chlorthalidone (HYGROTON) 25 MG tablet Take 1 tablet by mouth once daily 90 tablet 2   KLOR-CON M20 20 MEQ tablet Take 20-40 mEq by mouth as directed. Take 40 meq for 2 days then take 20 meq-alternating     No facility-administered medications prior to visit.     Allergies:   Sulfonamide derivatives   Social History   Socioeconomic History   Marital status: Divorced    Spouse name: Not on file   Number of children: Not on file   Years of education: Not on file   Highest education level: Not on file  Occupational History   Not on file  Tobacco Use   Smoking status: Never    Passive exposure: Never   Smokeless tobacco: Never  Vaping Use   Vaping Use: Never used  Substance and Sexual Activity   Alcohol use: No    Alcohol/week: 0.0 standard drinks of alcohol   Drug use: No   Sexual activity: Not Currently  Other Topics Concern   Not on file  Social History Narrative   Not on file   Social Determinants of Health   Financial Resource Strain: Not on file  Food Insecurity: Not on file  Transportation Needs: Not on file  Physical Activity: Not on file  Stress: Not on file  Social Connections: Not on file     Family History:  The patient's family history includes Colitis in her sister; Heart attack in her father; Heart disease in her father; Parkinson's disease in her sister.   Review of Systems:    Please see the history of present illness.     All other systems reviewed and are  otherwise negative except as noted above.   Physical Exam:    VS:  BP 118/60   Pulse (!) 53   Ht 5\' 8"  (1.727 m)   Wt 180 lb 12.8 oz (82 kg)   SpO2 94%   BMI 27.49 kg/m    General: Pleasant elderly female appearing in no acute distress. Head: Normocephalic, atraumatic. Neck: No carotid bruits. JVD not elevated.  Lungs: Respirations regular and unlabored, without wheezes or rales.  Heart: Irregularly irregular.  No S3 or S4. 2/6 SEM along RUSB.  Abdomen: Appears non-distended. No obvious abdominal masses. Msk:  Strength and tone appear normal for age. No obvious joint deformities or effusions. Extremities: No clubbing or cyanosis. Trace lower extremity edema.  Distal pedal pulses are 2+ bilaterally. Neuro: Alert and oriented X 3. Moves all extremities spontaneously. No focal deficits noted. Psych:  Responds to questions appropriately with a normal affect. Skin: No rashes or lesions noted  Wt Readings from Last 3 Encounters:  03/01/22 180 lb 12.8 oz (82 kg)  10/24/21 182 lb (82.6 kg)  09/05/21 182 lb 4.8 oz (82.7 kg)      Studies/Labs Reviewed:   EKG:  EKG is ordered today. The ekg ordered today demonstrates rate-controlled atrial fibrillation, heart rate 64 with occasional PVC's.  Recent Labs: 03/01/2022: ALT 30; B Natriuretic Peptide 229.0; BUN 19; Creatinine, Ser 1.12; Hemoglobin 13.4; Platelets 149; Potassium 2.8; Sodium 136; TSH 1.250   Lipid Panel    Component Value Date/Time   CHOL 100 03/20/2017 0142   TRIG 74 03/20/2017 0142   HDL 42 03/20/2017 0142   CHOLHDL 2.4 03/20/2017 0142   VLDL 15 03/20/2017 0142   LDLCALC 43 03/20/2017 0142    Additional studies/ records that were reviewed today include:   NST: 03/2017 IMPRESSION: 1. Tiny fixed perfusion defect at the apex.   2. Normal left ventricular wall motion.   3. Left ventricular ejection fraction 64%   4. Non invasive risk stratification*: Low risk.  Echocardiogram: 10/2019 IMPRESSIONS     1. Left  ventricular ejection fraction, by estimation, is 60 to 65%. The  left ventricle has normal function. The left ventricle has no regional  wall motion abnormalities. There is moderate concentric left ventricular  hypertrophy. Left ventricular  diastolic parameters are indeterminate.   2. Right ventricular systolic function is mildly reduced. The right  ventricular size is mildly enlarged. There is normal pulmonary artery  systolic pressure.   3. Left atrial size was severely dilated.   4. Right atrial size was moderately dilated.   5. The mitral valve is degenerative. Mild to moderate mitral valve  regurgitation.   6. Tricuspid valve regurgitation is moderate.   7. The aortic valve is tricuspid. Aortic valve regurgitation is mild.  Mild aortic valve sclerosis is present, with no evidence of aortic valve  stenosis.   8. The inferior vena cava is normal in size with greater than 50%  respiratory variability, suggesting right atrial pressure of 3 mmHg.   Comparison(s): Echocardiogram done 03/20/17 showed an EF of 65%.   Carotid Dopplers: 08/2021 Summary:  Right Carotid: Velocities in the right ICA are consistent with a 1-39%  stenosis.   Left Carotid: Velocities in the left ICA are consistent with a 1-39%  stenosis.                CEA patent.   Assessment:    1. Dyspnea on exertion   2. Fatigue, unspecified type   3. Coronary artery disease involving native coronary artery of native heart with other form of angina pectoris (HCC)   4. Carotid stenosis, bilateral   5. Persistent atrial fibrillation (HCC)   6. Essential hypertension   7. Mitral valve insufficiency, unspecified etiology      Plan:   In order of problems listed above:  1. Dyspnea on Exertion/Fatigue - She reports worsening dyspnea on exertion and fatigue over the past month and I am concerned this could be her anginal equivalent as she reports her  main symptom in 2022 at the time of CABG was shortness of breath  and she did not have chest pain at that time. - Will plan to obtain lab work today including CBC, CMET, TSH and BNP given no recent labs from her PCP. Will arrange for a Lexiscan Myoview for ischemic evaluation and hopefully this can be performed within the next week. If abnormal, we reviewed today that she would require a cardiac catheterization for definitive evaluation.  Addendum: 03/07/2022 Called to review stress test results with the patient and recommendations for a cardiac catheterization. The patient understands that risks include but are not limited to stroke (1 in 1000), death (1 in 1000), kidney failure [usually temporary] (1 in 500), bleeding (1 in 200), allergic reaction [possibly serious] (1 in 200). Questions answered and she is willing to proceed.    Catheterization scheduled for 03/15/2022 with Dr. Eldridge Dace at 9:00 AM. Needs to arrive at 7:00 AM. Hold Eliquis 48 hours prior to her catheterization and hold Lasix the morning of her procedure. She does need a repeat BMET this week when she comes to pick up her instruction letter given hypokalemia by recent labs.     2. CAD - She is s/p CABG in 2002 with LIMA-LAD, RIMA-RCA, SVG-OM1, SVG-D2 and had a low-risk NST in 03/2017. Will plan for a repeat Lexiscan Myoview for ischemic evaluation as outlined above. - Continue current medical therapy with Atorvastatin 40 mg daily and Lopressor 12.5 mg twice daily. She is no longer on ASA given the need for anticoagulation.  3. Carotid Artery Stenosis  - Followed by Vascular Surgery. She is s/p L CEA in 12/2000 and most recent dopplers in 08/2021 showed 1 to 39% stenosis bilaterally.  4. Persistent Atrial Fibrillation - She remains on Lopressor 12.5 mg twice daily for rate control and she is on Eliquis 5 mg twice daily for anticoagulation. No reports of active bleeding. Will recheck a CBC with upcoming labs.   5. HTN - BP is well controlled at 118/60 during today's visit. Continue current medical  therapy with Amlodipine 5 mg daily, Chlorthalidone 25 mg daily, Lisinopril 10 mg twice daily and Lopressor 12.5 mg twice daily.  6. Mitral Regurgitation - This was mild to moderate by echo in 2021. She does have a mild murmur on examination today but most prominent along the aortic position and she did have aortic sclerosis. Pending initial work-up, would have a low-threshold to repeat an echocardiogram.    Medication Adjustments/Labs and Tests Ordered: Current medicines are reviewed at length with the patient today.  Concerns regarding medicines are outlined above.  Medication changes, Labs and Tests ordered today are listed in the Patient Instructions below. Patient Instructions  Medication Instructions:  Your physician recommends that you continue on your current medications as directed. Please refer to the Current Medication list given to you today.  *If you need a refill on your cardiac medications before your next appointment, please call your pharmacy*   Lab Work: Your physician recommends that you return for lab work in: Today   If you have labs (blood work) drawn today and your tests are completely normal, you will receive your results only by: MyChart Message (if you have MyChart) OR A paper copy in the mail If you have any lab test that is abnormal or we need to change your treatment, we will call you to review the results.   Testing/Procedures: Your physician has requested that you have a lexiscan myoview. For further information please visit  [email or web address] Please follow instruction sheet, as given.    Follow-Up: At [facility], you and your health needs are our priority.  As part of our continuing mission to provide you with exceptional heart care, we have created designated Provider Care Teams.  These Care Teams include your primary Cardiologist (physician) and Advanced Practice Providers (APPs -  Physician Assistants and Nurse Practitioners) who all work  together to provide you with the care you need, when you need it.  We recommend signing up for the patient portal called "MyChart".  Sign up information is provided on this After Visit Summary.  MyChart is used to connect with patients for Virtual Visits (Telemedicine).  Patients are able to view lab/test results, encounter notes, upcoming appointments, etc.  Non-urgent messages can be sent to your provider as well.   To learn more about what you can do with MyChart, go to [email or web address].    Your next appointment:    As Scheduled   The format for your next appointment:   In Person  Provider:   Dina Rich, MD    Other Instructions Thank you for choosing [facility]!    Important Information About Sugar         Signed, Ellsworth Lennox, PA-C  03/01/2022 5:13 PM    [facility] [address] [place], [place] [address] Phone: [phone] Fax: [phone]

## 2022-03-01 NOTE — Telephone Encounter (Signed)
Pt notified of test results and the need to have labs.

## 2022-03-01 NOTE — H&P (View-Only) (Signed)
Cardiology Office Note    Date:  03/01/2022   ID:  Monica Stafford, Monica Stafford 1941-05-30, MRN [id number]  PCP:  Gareth Morgan, MD  Cardiologist: Dina Rich, MD    Chief Complaint  Patient presents with   Follow-up    Worsening dyspnea on exertion and fatigue    History of Present Illness:    Monica Stafford is a 81 y.o. female with past medical history of CAD (s/p CABG in 2002 with LIMA-LAD, RIMA-RCA, SVG-OM1, SVG-D2, low-risk NST in 03/2017), carotid artery stenosis (s/p L CEA in 12/2000), paroxysmal atrial fibrillation, mitral regurgitation (mild to moderate by echo in 2021), HTN, HLD and prior syncope who presents to the office today for evaluation of worsening dyspnea and fatigue.   She was last examined by Dr. Wyline Mood in 10/2021 and denied any recent chest pain or dyspnea on exertion at that time. She had previously experienced lower extremity edema with Amlodipine 10 mg daily and this had improved with dose reduction to 5 mg daily. No changes were made to her cardiac medications at that time.  She called the office on 02/26/2022 reporting worsening fatigue and dyspnea on exertion for the past 2 to 3 months, therefore a follow-up visit was arranged for further assessment.  In talking with the patient today, she reports acutely worsening dyspnea on exertion and fatigue over the past month. She continues to work part-time at [employer] and goes to exercise classes at [employer] at least 2 days/week but notices that she is getting more short of breath with walking up steps at work or doing her exercise program. Feels like her legs weigh her down as well and she has little energy. No recent chest pain or palpitations. No specific orthopnea or PND. She does experience intermittent lower extremity edema.   Past Medical History:  Diagnosis Date   Arthritis    back  all over   CAD (coronary artery disease)    a. s/p CABG in 2002 with LIMA-LAD, RIMA-RCA, SVG-OM1, SVG-D2 b. low-risk  NST in 03/2017   Hyperlipidemia    Hypertension    PAF (paroxysmal atrial fibrillation) (HCC)    Peripheral vascular disease (HCC)    Pneumonia     50 years  ago   Sleep apnea      study but dont wear   mask    Past Surgical History:  Procedure Laterality Date   ABDOMINAL HYSTERECTOMY  1998   [facility]   CARDIAC CATHETERIZATION     CATARACT EXTRACTION W/PHACO  12/10/2011   Procedure: CATARACT EXTRACTION PHACO AND INTRAOCULAR LENS PLACEMENT (IOC);  Surgeon: Gemma Payor, MD;  Location: [facility];  Service: Ophthalmology;  Laterality: Right;  CDE:10.40   CATARACT EXTRACTION W/PHACO  01/07/2012   Procedure: CATARACT EXTRACTION PHACO AND INTRAOCULAR LENS PLACEMENT (IOC);  Surgeon: Gemma Payor, MD;  Location: [facility];  Service: Ophthalmology;  Laterality: Left;  CDE 8.38   CORONARY ARTERY BYPASS GRAFT  12/23/2000   [facility]   LAPAROSCOPIC APPENDECTOMY N/A 02/27/2016   Procedure: APPENDECTOMY LAPAROSCOPIC;  Surgeon: Franky Macho, MD;  Location: [facility];  Service: General;  Laterality: N/A;    Current Medications: Outpatient Medications Prior to Visit  Medication Sig Dispense Refill   acetaminophen (TYLENOL) 500 MG tablet Take 1,000 mg by mouth 3 (three) times daily as needed for mild pain or moderate pain.     alendronate (FOSAMAX) 70 MG tablet Take 70 mg by mouth once a week.     ALPRAZolam (  XANAX) 1 MG tablet Take 0.25-1 mg by mouth 3 (three) times daily as needed for anxiety.      amLODipine (NORVASC) 5 MG tablet Take 1 tablet by mouth once daily 90 tablet 1   apixaban (ELIQUIS) 5 MG TABS tablet Take 1 tablet by mouth twice daily 180 tablet 1   atorvastatin (LIPITOR) 40 MG tablet Take 40 mg by mouth daily.     Calcium Carbonate-Vitamin D 600-400 MG-UNIT tablet Take 1 tablet by mouth 3 (three) times daily.      estradiol (ESTRACE) 0.1 MG/GM vaginal cream Place 1 g vaginally every 30 (thirty) days.      fish oil-omega-3 fatty acids 1000 MG capsule Take 1 g by mouth daily.       lisinopril (ZESTRIL) 10 MG tablet Take 10 mg by mouth 2 (two) times daily.     metoprolol tartrate (LOPRESSOR) 25 MG tablet Take 1/2 (one-half) tablet by mouth twice daily 90 tablet 1   Multiple Vitamin (MULTIVITAMIN WITH MINERALS) TABS Take 1 tablet by mouth daily.     nitroGLYCERIN (NITROSTAT) 0.4 MG SL tablet Place 1 tablet (0.4 mg total) under the tongue every 5 (five) minutes as needed for chest pain. 25 tablet 1   raloxifene (EVISTA) 60 MG tablet Take 60 mg by mouth every other day.      chlorthalidone (HYGROTON) 25 MG tablet Take 1 tablet by mouth once daily 90 tablet 2   KLOR-CON M20 20 MEQ tablet Take 20-40 mEq by mouth as directed. Take 40 meq for 2 days then take 20 meq-alternating     No facility-administered medications prior to visit.     Allergies:   Sulfonamide derivatives   Social History   Socioeconomic History   Marital status: Divorced    Spouse name: Not on file   Number of children: Not on file   Years of education: Not on file   Highest education level: Not on file  Occupational History   Not on file  Tobacco Use   Smoking status: Never    Passive exposure: Never   Smokeless tobacco: Never  Vaping Use   Vaping Use: Never used  Substance and Sexual Activity   Alcohol use: No    Alcohol/week: 0.0 standard drinks of alcohol   Drug use: No   Sexual activity: Not Currently  Other Topics Concern   Not on file  Social History Narrative   Not on file   Social Determinants of Health   Financial Resource Strain: Not on file  Food Insecurity: Not on file  Transportation Needs: Not on file  Physical Activity: Not on file  Stress: Not on file  Social Connections: Not on file     Family History:  The patient's family history includes Colitis in her sister; Heart attack in her father; Heart disease in her father; Parkinson's disease in her sister.   Review of Systems:    Please see the history of present illness.     All other systems reviewed and are  otherwise negative except as noted above.   Physical Exam:    VS:  BP 118/60   Pulse (!) 53   Ht 5\' 8"  (1.727 m)   Wt 180 lb 12.8 oz (82 kg)   SpO2 94%   BMI 27.49 kg/m    General: Pleasant elderly female appearing in no acute distress. Head: Normocephalic, atraumatic. Neck: No carotid bruits. JVD not elevated.  Lungs: Respirations regular and unlabored, without wheezes or rales.  Heart: Irregularly irregular.  No S3 or S4. 2/6 SEM along RUSB.  Abdomen: Appears non-distended. No obvious abdominal masses. Msk:  Strength and tone appear normal for age. No obvious joint deformities or effusions. Extremities: No clubbing or cyanosis. Trace lower extremity edema.  Distal pedal pulses are 2+ bilaterally. Neuro: Alert and oriented X 3. Moves all extremities spontaneously. No focal deficits noted. Psych:  Responds to questions appropriately with a normal affect. Skin: No rashes or lesions noted  Wt Readings from Last 3 Encounters:  03/01/22 180 lb 12.8 oz (82 kg)  10/24/21 182 lb (82.6 kg)  09/05/21 182 lb 4.8 oz (82.7 kg)      Studies/Labs Reviewed:   EKG:  EKG is ordered today. The ekg ordered today demonstrates rate-controlled atrial fibrillation, heart rate 64 with occasional PVC's.  Recent Labs: 03/01/2022: ALT 30; B Natriuretic Peptide 229.0; BUN 19; Creatinine, Ser 1.12; Hemoglobin 13.4; Platelets 149; Potassium 2.8; Sodium 136; TSH 1.250   Lipid Panel    Component Value Date/Time   CHOL 100 03/20/2017 0142   TRIG 74 03/20/2017 0142   HDL 42 03/20/2017 0142   CHOLHDL 2.4 03/20/2017 0142   VLDL 15 03/20/2017 0142   LDLCALC 43 03/20/2017 0142    Additional studies/ records that were reviewed today include:   NST: 03/2017 IMPRESSION: 1. Tiny fixed perfusion defect at the apex.   2. Normal left ventricular wall motion.   3. Left ventricular ejection fraction 64%   4. Non invasive risk stratification*: Low risk.  Echocardiogram: 10/2019 IMPRESSIONS     1. Left  ventricular ejection fraction, by estimation, is 60 to 65%. The  left ventricle has normal function. The left ventricle has no regional  wall motion abnormalities. There is moderate concentric left ventricular  hypertrophy. Left ventricular  diastolic parameters are indeterminate.   2. Right ventricular systolic function is mildly reduced. The right  ventricular size is mildly enlarged. There is normal pulmonary artery  systolic pressure.   3. Left atrial size was severely dilated.   4. Right atrial size was moderately dilated.   5. The mitral valve is degenerative. Mild to moderate mitral valve  regurgitation.   6. Tricuspid valve regurgitation is moderate.   7. The aortic valve is tricuspid. Aortic valve regurgitation is mild.  Mild aortic valve sclerosis is present, with no evidence of aortic valve  stenosis.   8. The inferior vena cava is normal in size with greater than 50%  respiratory variability, suggesting right atrial pressure of 3 mmHg.   Comparison(s): Echocardiogram done 03/20/17 showed an EF of 65%.   Carotid Dopplers: 08/2021 Summary:  Right Carotid: Velocities in the right ICA are consistent with a 1-39%  stenosis.   Left Carotid: Velocities in the left ICA are consistent with a 1-39%  stenosis.                CEA patent.   Assessment:    1. Dyspnea on exertion   2. Fatigue, unspecified type   3. Coronary artery disease involving native coronary artery of native heart with other form of angina pectoris (HCC)   4. Carotid stenosis, bilateral   5. Persistent atrial fibrillation (HCC)   6. Essential hypertension   7. Mitral valve insufficiency, unspecified etiology      Plan:   In order of problems listed above:  1. Dyspnea on Exertion/Fatigue - She reports worsening dyspnea on exertion and fatigue over the past month and I am concerned this could be her anginal equivalent as she reports her  main symptom in 2022 at the time of CABG was shortness of breath  and she did not have chest pain at that time. - Will plan to obtain lab work today including CBC, CMET, TSH and BNP given no recent labs from her PCP. Will arrange for a Lexiscan Myoview for ischemic evaluation and hopefully this can be performed within the next week. If abnormal, we reviewed today that she would require a cardiac catheterization for definitive evaluation.  Addendum: 03/07/2022 Called to review stress test results with the patient and recommendations for a cardiac catheterization. The patient understands that risks include but are not limited to stroke (1 in 1000), death (1 in 1000), kidney failure [usually temporary] (1 in 500), bleeding (1 in 200), allergic reaction [possibly serious] (1 in 200). Questions answered and she is willing to proceed.    Catheterization scheduled for 03/15/2022 with Dr. Eldridge Dace at 9:00 AM. Needs to arrive at 7:00 AM. Hold Eliquis 48 hours prior to her catheterization and hold Lasix the morning of her procedure. She does need a repeat BMET this week when she comes to pick up her instruction letter given hypokalemia by recent labs.     2. CAD - She is s/p CABG in 2002 with LIMA-LAD, RIMA-RCA, SVG-OM1, SVG-D2 and had a low-risk NST in 03/2017. Will plan for a repeat Lexiscan Myoview for ischemic evaluation as outlined above. - Continue current medical therapy with Atorvastatin 40 mg daily and Lopressor 12.5 mg twice daily. She is no longer on ASA given the need for anticoagulation.  3. Carotid Artery Stenosis  - Followed by Vascular Surgery. She is s/p L CEA in 12/2000 and most recent dopplers in 08/2021 showed 1 to 39% stenosis bilaterally.  4. Persistent Atrial Fibrillation - She remains on Lopressor 12.5 mg twice daily for rate control and she is on Eliquis 5 mg twice daily for anticoagulation. No reports of active bleeding. Will recheck a CBC with upcoming labs.   5. HTN - BP is well controlled at 118/60 during today's visit. Continue current medical  therapy with Amlodipine 5 mg daily, Chlorthalidone 25 mg daily, Lisinopril 10 mg twice daily and Lopressor 12.5 mg twice daily.  6. Mitral Regurgitation - This was mild to moderate by echo in 2021. She does have a mild murmur on examination today but most prominent along the aortic position and she did have aortic sclerosis. Pending initial work-up, would have a low-threshold to repeat an echocardiogram.    Medication Adjustments/Labs and Tests Ordered: Current medicines are reviewed at length with the patient today.  Concerns regarding medicines are outlined above.  Medication changes, Labs and Tests ordered today are listed in the Patient Instructions below. Patient Instructions  Medication Instructions:  Your physician recommends that you continue on your current medications as directed. Please refer to the Current Medication list given to you today.  *If you need a refill on your cardiac medications before your next appointment, please call your pharmacy*   Lab Work: Your physician recommends that you return for lab work in: Today   If you have labs (blood work) drawn today and your tests are completely normal, you will receive your results only by: MyChart Message (if you have MyChart) OR A paper copy in the mail If you have any lab test that is abnormal or we need to change your treatment, we will call you to review the results.   Testing/Procedures: Your physician has requested that you have a lexiscan myoview. For further information please visit  [email or web address] Please follow instruction sheet, as given.    Follow-Up: At [facility], you and your health needs are our priority.  As part of our continuing mission to provide you with exceptional heart care, we have created designated Provider Care Teams.  These Care Teams include your primary Cardiologist (physician) and Advanced Practice Providers (APPs -  Physician Assistants and Nurse Practitioners) who all work  together to provide you with the care you need, when you need it.  We recommend signing up for the patient portal called "MyChart".  Sign up information is provided on this After Visit Summary.  MyChart is used to connect with patients for Virtual Visits (Telemedicine).  Patients are able to view lab/test results, encounter notes, upcoming appointments, etc.  Non-urgent messages can be sent to your provider as well.   To learn more about what you can do with MyChart, go to [email or web address].    Your next appointment:    As Scheduled   The format for your next appointment:   In Person  Provider:   Dina Rich, MD    Other Instructions Thank you for choosing [facility]!    Important Information About Sugar         Signed, Ellsworth Lennox, PA-C  03/01/2022 5:13 PM    [facility] [address] [place], [place] [address] Phone: [phone] Fax: [phone]

## 2022-03-01 NOTE — Patient Instructions (Signed)
Medication Instructions:  Your physician recommends that you continue on your current medications as directed. Please refer to the Current Medication list given to you today.  *If you need a refill on your cardiac medications before your next appointment, please call your pharmacy*   Lab Work: Your physician recommends that you return for lab work in: Today   If you have labs (blood work) drawn today and your tests are completely normal, you will receive your results only by: MyChart Message (if you have MyChart) OR A paper copy in the mail If you have any lab test that is abnormal or we need to change your treatment, we will call you to review the results.   Testing/Procedures: Your physician has requested that you have a lexiscan myoview. For further information please visit [email or web address] Please follow instruction sheet, as given.    Follow-Up: At [facility], you and your health needs are our priority.  As part of our continuing mission to provide you with exceptional heart care, we have created designated Provider Care Teams.  These Care Teams include your primary Cardiologist (physician) and Advanced Practice Providers (APPs -  Physician Assistants and Nurse Practitioners) who all work together to provide you with the care you need, when you need it.  We recommend signing up for the patient portal called "MyChart".  Sign up information is provided on this After Visit Summary.  MyChart is used to connect with patients for Virtual Visits (Telemedicine).  Patients are able to view lab/test results, encounter notes, upcoming appointments, etc.  Non-urgent messages can be sent to your provider as well.   To learn more about what you can do with MyChart, go to [email or web address].    Your next appointment:    As Scheduled   The format for your next appointment:   In Person  Provider:   Dina Rich, MD    Other Instructions Thank you for choosing [facility]!    Important Information About Sugar

## 2022-03-06 ENCOUNTER — Ambulatory Visit (HOSPITAL_COMMUNITY)
Admission: RE | Admit: 2022-03-06 | Discharge: 2022-03-06 | Disposition: A | Discharge status: Home / Self Care | Payer: Medicare HMO | Source: Ambulatory Visit | Attending: Student | Admitting: Student

## 2022-03-06 ENCOUNTER — Other Ambulatory Visit: Payer: Self-pay | Admitting: Cardiology

## 2022-03-06 DIAGNOSIS — R5383 Other fatigue: Secondary | ICD-10-CM | POA: Diagnosis not present

## 2022-03-06 DIAGNOSIS — R0609 Other forms of dyspnea: Secondary | ICD-10-CM | POA: Insufficient documentation

## 2022-03-06 LAB — NM MYOCAR MULTI W/SPECT W/WALL MOTION / EF
LV dias vol: 78 mL (ref 46–106)
LV sys vol: 25 mL
Nuc Stress EF: 68 %
Peak HR: 88 {beats}/min
RATE: 0.5
Rest HR: 63 {beats}/min
Rest Nuclear Isotope Dose: 10.5 mCi
SDS: 3
SRS: 1
SSS: 4
ST Depression (mm): 0 mm
Stress Nuclear Isotope Dose: 30 mCi
TID: 1.13

## 2022-03-06 MED ORDER — REGADENOSON 0.4 MG/5ML IV SOLN
INTRAVENOUS | Status: AC
  Administered 2022-03-06: 0.4 mg via INTRAVENOUS
  Filled 2022-03-06: qty 5

## 2022-03-06 MED ORDER — TECHNETIUM TC 99M TETROFOSMIN IV KIT
30.0000 | PACK | Freq: Once | INTRAVENOUS | Status: AC | PRN
  Administered 2022-03-06: 30 via INTRAVENOUS

## 2022-03-06 MED ORDER — SODIUM CHLORIDE FLUSH 0.9 % IV SOLN
INTRAVENOUS | Status: AC
  Administered 2022-03-06: 10 mL via INTRAVENOUS
  Filled 2022-03-06: qty 10

## 2022-03-06 MED ORDER — TECHNETIUM TC 99M TETROFOSMIN IV KIT
10.0000 | PACK | Freq: Once | INTRAVENOUS | Status: AC | PRN
  Administered 2022-03-06: 10.5 via INTRAVENOUS

## 2022-03-07 ENCOUNTER — Encounter: Payer: Self-pay | Admitting: *Deleted

## 2022-03-07 ENCOUNTER — Telehealth: Payer: Self-pay | Admitting: Student

## 2022-03-07 NOTE — Telephone Encounter (Signed)
Instruction letter prepared and printed for pt.

## 2022-03-07 NOTE — Telephone Encounter (Signed)
    Called to review stress test results with the patient and recommendations for a cardiac catheterization. The patient understands that risks include but are not limited to stroke (1 in 1000), death (1 in 64), kidney failure [usually temporary] (1 in 500), bleeding (1 in 200), allergic reaction [possibly serious] (1 in 200). Questions answered and she is willing to proceed.   Catheterization scheduled for 03/15/2022 with Dr. Irish Lack at 9:00 AM. Needs to arrive at 7:00 AM. Hold Eliquis 48 hours prior to her catheterization and hold Lasix the morning of her procedure. She does need a repeat BMET this week when she comes to pick up her instruction letter given hypokalemia by recent labs.   Signed, Erma Heritage, PA-C 03/07/2022, 12:16 PM

## 2022-03-08 ENCOUNTER — Other Ambulatory Visit (HOSPITAL_COMMUNITY)
Admission: RE | Admit: 2022-03-08 | Discharge: 2022-03-08 | Disposition: A | Discharge status: Home / Self Care | Payer: Medicare HMO | Source: Ambulatory Visit | Attending: Student | Admitting: Student

## 2022-03-08 ENCOUNTER — Telehealth: Payer: Self-pay | Admitting: *Deleted

## 2022-03-08 DIAGNOSIS — Z79899 Other long term (current) drug therapy: Secondary | ICD-10-CM | POA: Insufficient documentation

## 2022-03-08 LAB — BASIC METABOLIC PANEL
Anion gap: 7 (ref 5–15)
BUN: 19 mg/dL (ref 8–23)
CO2: 32 mmol/L (ref 22–32)
Calcium: 9.5 mg/dL (ref 8.9–10.3)
Chloride: 99 mmol/L (ref 98–111)
Creatinine, Ser: 0.96 mg/dL (ref 0.44–1.00)
GFR, Estimated: 59 mL/min — ABNORMAL LOW (ref >60)
Glucose, Bld: 110 mg/dL — ABNORMAL HIGH (ref 70–99)
Potassium: 3.1 mmol/L — ABNORMAL LOW (ref 3.5–5.1)
Sodium: 138 mmol/L (ref 135–145)

## 2022-03-08 MED ORDER — POTASSIUM CHLORIDE CRYS ER 20 MEQ PO TBCR: 60.0000 meq | EXTENDED_RELEASE_TABLET | Freq: Every day | ORAL | 3 refills | Status: DC

## 2022-03-08 NOTE — Telephone Encounter (Signed)
Pt notified to increase K-Dur to 60 meq daily and have lab work done on next [employer], or Wed. Pt voiced understanding.

## 2022-03-08 NOTE — Telephone Encounter (Signed)
-----   Message from Erma Heritage, [place] sent at 03/08/2022  2:59 PM EDT ----- Please let the patient know that her kidney function has improved when compared to prior values but her potassium is still low at 3.1. If she has been taking K-dur 40 mEq daily, I would recommend increasing to 60 mEq daily. Given her cath next week, would prefer to recheck a BMET again next Tuesday or Wednesday to make sure this has normalized prior to her procedure.

## 2022-03-13 ENCOUNTER — Other Ambulatory Visit (HOSPITAL_COMMUNITY)
Admission: RE | Admit: 2022-03-13 | Discharge: 2022-03-13 | Disposition: A | Discharge status: Home / Self Care | Payer: Medicare HMO | Source: Ambulatory Visit | Attending: Student | Admitting: Student

## 2022-03-13 ENCOUNTER — Other Ambulatory Visit: Payer: Self-pay | Admitting: Cardiology

## 2022-03-13 DIAGNOSIS — Z79899 Other long term (current) drug therapy: Secondary | ICD-10-CM | POA: Diagnosis present

## 2022-03-13 LAB — BASIC METABOLIC PANEL
Anion gap: 8 (ref 5–15)
BUN: 17 mg/dL (ref 8–23)
CO2: 30 mmol/L (ref 22–32)
Calcium: 9.1 mg/dL (ref 8.9–10.3)
Chloride: 100 mmol/L (ref 98–111)
Creatinine, Ser: 0.89 mg/dL (ref 0.44–1.00)
GFR, Estimated: >60 mL/min (ref >60)
Glucose, Bld: 104 mg/dL — ABNORMAL HIGH (ref 70–99)
Potassium: 3.6 mmol/L (ref 3.5–5.1)
Sodium: 138 mmol/L (ref 135–145)

## 2022-03-14 ENCOUNTER — Telehealth: Payer: Self-pay | Admitting: *Deleted

## 2022-03-14 NOTE — Telephone Encounter (Addendum)
Cardiac Catheterization scheduled at [facility] for: Thursday March 15, 2022 9 AM Arrival time and place: [facility] Main Entrance A at: 7 AM  Nothing to eat after midnight prior to procedure, clear liquids until 5 AM day of procedure.  Medication instructions: -Hold:  Eliquis-none 03/13/22 until post procedure  Lasix-AM of procedure -Except hold medications usual morning medications can be taken with sips of water including aspirin 81 mg.  Confirmed patient has responsible adult to drive home post procedure and be with patient first 24 hours after arriving home.  Patient reports no new symptoms concerning for COVID-19 in the past 10 days.  Reviewed procedure instructions with patient.

## 2022-03-15 ENCOUNTER — Observation Stay (HOSPITAL_COMMUNITY)
Admission: RE | Admit: 2022-03-15 | Discharge: 2022-03-16 | Disposition: A | Discharge status: Home / Self Care | Payer: Medicare HMO | Attending: Cardiology | Admitting: Cardiology

## 2022-03-15 ENCOUNTER — Encounter (HOSPITAL_COMMUNITY)
Admission: RE | Disposition: A | Discharge status: Home / Self Care | Payer: Self-pay | Source: Home / Self Care | Attending: Interventional Cardiology

## 2022-03-15 ENCOUNTER — Other Ambulatory Visit: Payer: Self-pay

## 2022-03-15 DIAGNOSIS — I5031 Acute diastolic (congestive) heart failure: Secondary | ICD-10-CM | POA: Insufficient documentation

## 2022-03-15 DIAGNOSIS — I340 Nonrheumatic mitral (valve) insufficiency: Secondary | ICD-10-CM | POA: Insufficient documentation

## 2022-03-15 DIAGNOSIS — I4819 Other persistent atrial fibrillation: Secondary | ICD-10-CM | POA: Diagnosis not present

## 2022-03-15 DIAGNOSIS — I25810 Atherosclerosis of coronary artery bypass graft(s) without angina pectoris: Secondary | ICD-10-CM | POA: Diagnosis not present

## 2022-03-15 DIAGNOSIS — R0600 Dyspnea, unspecified: Secondary | ICD-10-CM | POA: Diagnosis not present

## 2022-03-15 DIAGNOSIS — Z79899 Other long term (current) drug therapy: Secondary | ICD-10-CM | POA: Insufficient documentation

## 2022-03-15 DIAGNOSIS — I2510 Atherosclerotic heart disease of native coronary artery without angina pectoris: Secondary | ICD-10-CM | POA: Diagnosis not present

## 2022-03-15 DIAGNOSIS — E876 Hypokalemia: Secondary | ICD-10-CM | POA: Insufficient documentation

## 2022-03-15 DIAGNOSIS — Z951 Presence of aortocoronary bypass graft: Secondary | ICD-10-CM | POA: Diagnosis not present

## 2022-03-15 DIAGNOSIS — Z7901 Long term (current) use of anticoagulants: Secondary | ICD-10-CM | POA: Insufficient documentation

## 2022-03-15 DIAGNOSIS — Z955 Presence of coronary angioplasty implant and graft: Principal | ICD-10-CM

## 2022-03-15 DIAGNOSIS — I110 Hypertensive heart disease with heart failure: Secondary | ICD-10-CM | POA: Diagnosis not present

## 2022-03-15 DIAGNOSIS — R9439 Abnormal result of other cardiovascular function study: Secondary | ICD-10-CM

## 2022-03-15 HISTORY — PX: CORONARY STENT INTERVENTION: CATH118234

## 2022-03-15 HISTORY — PX: LEFT HEART CATH AND CORS/GRAFTS ANGIOGRAPHY: CATH118250

## 2022-03-15 LAB — POCT ACTIVATED CLOTTING TIME
Activated Clotting Time: 275 seconds
Activated Clotting Time: 317 seconds
Activated Clotting Time: 269 seconds
Activated Clotting Time: 239 s
Activated Clotting Time: 215 s
Activated Clotting Time: 191 seconds
Activated Clotting Time: 173 seconds

## 2022-03-15 SURGERY — LEFT HEART CATH AND CORS/GRAFTS ANGIOGRAPHY
Anesthesia: LOCAL

## 2022-03-15 MED ORDER — LIDOCAINE HCL (PF) 1 % IJ SOLN
INTRAMUSCULAR | Status: DC | PRN
  Administered 2022-03-15: 2 mL via INTRADERMAL
  Administered 2022-03-15: 5 mL via INTRADERMAL

## 2022-03-15 MED ORDER — MIDAZOLAM HCL 2 MG/2ML IJ SOLN
INTRAMUSCULAR | Status: AC
  Filled 2022-03-15: qty 2

## 2022-03-15 MED ORDER — FAMOTIDINE IN NACL 20-0.9 MG/50ML-% IV SOLN
INTRAVENOUS | Status: DC | PRN
  Administered 2022-03-15: 20 mg via INTRAVENOUS

## 2022-03-15 MED ORDER — HYDRALAZINE HCL 20 MG/ML IJ SOLN
INTRAMUSCULAR | Status: AC
  Filled 2022-03-15: qty 1

## 2022-03-15 MED ORDER — SODIUM CHLORIDE 0.9 % IV SOLN: 250.0000 mL | INTRAVENOUS | Status: DC | PRN

## 2022-03-15 MED ORDER — CLOPIDOGREL BISULFATE 300 MG PO TABS
ORAL_TABLET | ORAL | Status: DC | PRN
  Administered 2022-03-15: 600 mg via ORAL

## 2022-03-15 MED ORDER — FAMOTIDINE IN NACL 20-0.9 MG/50ML-% IV SOLN
INTRAVENOUS | Status: AC
  Filled 2022-03-15: qty 50

## 2022-03-15 MED ORDER — ADULT MULTIVITAMIN W/MINERALS CH
1.0000 | ORAL_TABLET | Freq: Every morning | ORAL | Status: DC
  Administered 2022-03-16: 1 via ORAL
  Filled 2022-03-15: qty 1

## 2022-03-15 MED ORDER — AMLODIPINE BESYLATE 5 MG PO TABS
5.0000 mg | ORAL_TABLET | Freq: Every day | ORAL | Status: DC
  Administered 2022-03-16: 5 mg via ORAL
  Filled 2022-03-15: qty 1

## 2022-03-15 MED ORDER — ASPIRIN 81 MG PO CHEW: 81.0000 mg | CHEWABLE_TABLET | ORAL | Status: DC

## 2022-03-15 MED ORDER — CALCIUM CARBONATE 1250 (500 CA) MG PO TABS
2.0000 | ORAL_TABLET | Freq: Every day | ORAL | Status: DC
  Administered 2022-03-15: 2500 mg via ORAL
  Filled 2022-03-15: qty 2

## 2022-03-15 MED ORDER — SODIUM CHLORIDE 0.9 % WEIGHT BASED INFUSION: 1.0000 mL/kg/h | INTRAVENOUS | Status: DC

## 2022-03-15 MED ORDER — RALOXIFENE HCL 60 MG PO TABS: 60.0000 mg | ORAL_TABLET | ORAL | Status: DC

## 2022-03-15 MED ORDER — SODIUM CHLORIDE 0.9 % IV SOLN: INTRAVENOUS | Status: AC

## 2022-03-15 MED ORDER — SODIUM CHLORIDE 0.9 % WEIGHT BASED INFUSION
3.0000 mL/kg/h | INTRAVENOUS | Status: DC
  Administered 2022-03-15: 3 mL/kg/h via INTRAVENOUS

## 2022-03-15 MED ORDER — METOPROLOL TARTRATE 12.5 MG HALF TABLET
12.5000 mg | ORAL_TABLET | Freq: Two times a day (BID) | ORAL | Status: DC
  Administered 2022-03-15 – 2022-03-16 (×2): 12.5 mg via ORAL
  Filled 2022-03-15 (×2): qty 1

## 2022-03-15 MED ORDER — OMEGA-3-ACID ETHYL ESTERS 1 G PO CAPS
1.0000 g | ORAL_CAPSULE | Freq: Every evening | ORAL | Status: DC
  Administered 2022-03-15: 1 g via ORAL
  Filled 2022-03-15 (×2): qty 1

## 2022-03-15 MED ORDER — SODIUM CHLORIDE 0.9% FLUSH
3.0000 mL | Freq: Two times a day (BID) | INTRAVENOUS | Status: DC
  Administered 2022-03-15 – 2022-03-16 (×2): 3 mL via INTRAVENOUS

## 2022-03-15 MED ORDER — SODIUM CHLORIDE 0.9% FLUSH: 3.0000 mL | INTRAVENOUS | Status: DC | PRN

## 2022-03-15 MED ORDER — NITROGLYCERIN 0.4 MG SL SUBL: 0.4000 mg | SUBLINGUAL_TABLET | SUBLINGUAL | Status: DC | PRN

## 2022-03-15 MED ORDER — POLYVINYL ALCOHOL 1.4 % OP SOLN: 1.0000 [drp] | Freq: Four times a day (QID) | OPHTHALMIC | Status: DC | PRN

## 2022-03-15 MED ORDER — MIDAZOLAM HCL 2 MG/2ML IJ SOLN
INTRAMUSCULAR | Status: DC | PRN
  Administered 2022-03-15 (×3): 1 mg via INTRAVENOUS

## 2022-03-15 MED ORDER — ALENDRONATE SODIUM 70 MG PO TABS: 70.0000 mg | ORAL_TABLET | ORAL | Status: DC

## 2022-03-15 MED ORDER — IOHEXOL 350 MG/ML SOLN
INTRAVENOUS | Status: DC | PRN
  Administered 2022-03-15: 173 mL

## 2022-03-15 MED ORDER — HEPARIN (PORCINE) IN NACL 1000-0.9 UT/500ML-% IV SOLN
INTRAVENOUS | Status: AC
  Filled 2022-03-15: qty 1000

## 2022-03-15 MED ORDER — CLOPIDOGREL BISULFATE 75 MG PO TABS
75.0000 mg | ORAL_TABLET | Freq: Every day | ORAL | Status: DC
  Administered 2022-03-16: 75 mg via ORAL
  Filled 2022-03-15: qty 1

## 2022-03-15 MED ORDER — CLOPIDOGREL BISULFATE 300 MG PO TABS
ORAL_TABLET | ORAL | Status: AC
  Filled 2022-03-15: qty 2

## 2022-03-15 MED ORDER — LABETALOL HCL 5 MG/ML IV SOLN: 10.0000 mg | INTRAVENOUS | Status: AC | PRN

## 2022-03-15 MED ORDER — HEPARIN SODIUM (PORCINE) 1000 UNIT/ML IJ SOLN
INTRAMUSCULAR | Status: AC
  Filled 2022-03-15: qty 10

## 2022-03-15 MED ORDER — FENTANYL CITRATE (PF) 100 MCG/2ML IJ SOLN
INTRAMUSCULAR | Status: DC | PRN
  Administered 2022-03-15 (×2): 25 ug via INTRAVENOUS

## 2022-03-15 MED ORDER — HEPARIN SODIUM (PORCINE) 1000 UNIT/ML IJ SOLN
INTRAMUSCULAR | Status: DC | PRN
  Administered 2022-03-15: 1000 [IU] via INTRAVENOUS
  Administered 2022-03-15: 10000 [IU] via INTRAVENOUS
  Administered 2022-03-15: 3000 [IU] via INTRAVENOUS

## 2022-03-15 MED ORDER — HEPARIN (PORCINE) IN NACL 1000-0.9 UT/500ML-% IV SOLN
INTRAVENOUS | Status: DC | PRN
  Administered 2022-03-15 (×2): 500 mL

## 2022-03-15 MED ORDER — ACETAMINOPHEN 500 MG PO TABS
1000.0000 mg | ORAL_TABLET | Freq: Three times a day (TID) | ORAL | Status: DC | PRN
  Administered 2022-03-15 (×2): 1000 mg via ORAL
  Filled 2022-03-15: qty 2

## 2022-03-15 MED ORDER — ONDANSETRON HCL 4 MG/2ML IJ SOLN: 4.0000 mg | Freq: Four times a day (QID) | INTRAMUSCULAR | Status: DC | PRN

## 2022-03-15 MED ORDER — HYDRALAZINE HCL 20 MG/ML IJ SOLN: 10.0000 mg | INTRAMUSCULAR | Status: AC | PRN

## 2022-03-15 MED ORDER — HYDRALAZINE HCL 20 MG/ML IJ SOLN
INTRAMUSCULAR | Status: DC | PRN
  Administered 2022-03-15: 10 mg via INTRAVENOUS

## 2022-03-15 MED ORDER — ESTRADIOL 0.1 MG/GM VA CREA
1.0000 g | TOPICAL_CREAM | VAGINAL | Status: DC | PRN
  Filled 2022-03-15: qty 42.5

## 2022-03-15 MED ORDER — FENTANYL CITRATE (PF) 100 MCG/2ML IJ SOLN
INTRAMUSCULAR | Status: AC
  Filled 2022-03-15: qty 2

## 2022-03-15 MED ORDER — NITROGLYCERIN 1 MG/10 ML FOR IR/CATH LAB
INTRA_ARTERIAL | Status: AC
  Filled 2022-03-15: qty 10

## 2022-03-15 MED ORDER — ACETAMINOPHEN 325 MG PO TABS: 650.0000 mg | ORAL_TABLET | ORAL | Status: DC | PRN

## 2022-03-15 MED ORDER — ATORVASTATIN CALCIUM 40 MG PO TABS
40.0000 mg | ORAL_TABLET | Freq: Every evening | ORAL | Status: DC
  Administered 2022-03-15: 40 mg via ORAL
  Filled 2022-03-15: qty 1

## 2022-03-15 MED ORDER — ACETAMINOPHEN 500 MG PO TABS
ORAL_TABLET | ORAL | Status: AC
  Filled 2022-03-15: qty 2

## 2022-03-15 MED ORDER — CALCIUM CARBONATE 1250 (500 CA) MG PO TABS
1.0000 | ORAL_TABLET | Freq: Every day | ORAL | Status: DC
  Administered 2022-03-16: 1250 mg via ORAL
  Filled 2022-03-15: qty 1

## 2022-03-15 MED ORDER — SODIUM CHLORIDE 0.9% FLUSH
3.0000 mL | Freq: Two times a day (BID) | INTRAVENOUS | Status: DC
  Administered 2022-03-16: 3 mL via INTRAVENOUS

## 2022-03-15 MED ORDER — POTASSIUM CHLORIDE CRYS ER 20 MEQ PO TBCR
60.0000 meq | EXTENDED_RELEASE_TABLET | Freq: Every day | ORAL | Status: DC
  Administered 2022-03-16: 60 meq via ORAL
  Filled 2022-03-15: qty 3

## 2022-03-15 MED ORDER — LIDOCAINE HCL (PF) 1 % IJ SOLN
INTRAMUSCULAR | Status: AC
  Filled 2022-03-15: qty 30

## 2022-03-15 MED ORDER — ALPRAZOLAM 0.25 MG PO TABS: 0.2500 mg | ORAL_TABLET | Freq: Three times a day (TID) | ORAL | Status: DC | PRN

## 2022-03-15 SURGICAL SUPPLY — 25 items
BALL SAPPHIRE NC24 3.5X8 (BALLOONS) ×1
BALLN SAPPHIRE 2.0X15 (BALLOONS) ×1
BALLN SAPPHIRE 2.5X12 (BALLOONS) ×1
BALLOON SAPPHIRE 2.0X15 (BALLOONS) IMPLANT
BALLOON SAPPHIRE 2.5X12 (BALLOONS) IMPLANT
BALLOON SAPPHIRE NC24 3.5X8 (BALLOONS) IMPLANT
CATH INFINITI 5FR MULTPACK ANG (CATHETERS) IMPLANT
CATH VISTA GUIDE 6FR XBRCA (CATHETERS) IMPLANT
ELECT DEFIB PAD ADLT CADENCE (PAD) IMPLANT
GUIDEWIRE ANGLED .035X150CM (WIRE) IMPLANT
KIT ENCORE 26 ADVANTAGE (KITS) IMPLANT
KIT HEMO VALVE WATCHDOG (MISCELLANEOUS) IMPLANT
KIT SYRINGE INJ CVI SPIKEX1 (MISCELLANEOUS) IMPLANT
PACK CARDIAC CATHETERIZATION (CUSTOM PROCEDURE TRAY) ×1 IMPLANT
SET ATX SIMPLICITY (MISCELLANEOUS) IMPLANT
SHEATH PINNACLE 5F 10CM (SHEATH) IMPLANT
SHEATH PINNACLE 6F 10CM (SHEATH) IMPLANT
SHEATH PROBE COVER 6X72 (BAG) IMPLANT
STENT SYNERGY XD 2.25X16 (Permanent Stent) IMPLANT
STENT SYNERGY XD 3.0X16 (Permanent Stent) IMPLANT
SYNERGY XD 2.25X16 (Permanent Stent) ×1 IMPLANT
SYNERGY XD 3.0X16 (Permanent Stent) ×1 IMPLANT
WIRE ASAHI PROWATER 180CM (WIRE) IMPLANT
WIRE EMERALD 3MM-J .035X150CM (WIRE) IMPLANT
WIRE HI TORQ VERSACORE-J 145CM (WIRE) IMPLANT

## 2022-03-15 NOTE — Interval H&P Note (Signed)
Cath Lab Visit (complete for each Cath Lab visit)  Clinical Evaluation Leading to the Procedure:   ACS: No.  Non-ACS:    Anginal Classification: CCS II  Anti-ischemic medical therapy: Maximal Therapy (2 or more classes of medications)  Non-Invasive Test Results: Intermediate-risk stress test findings: cardiac mortality 1-3%/year  Prior CABG: Previous CABG      History and Physical Interval Note:  03/15/2022 8:42 AM  Monica Stafford  has presented today for surgery, with the diagnosis of ABNORMAL STRESS TEST.  The various methods of treatment have been discussed with the patient and family. After consideration of risks, benefits and other options for treatment, the patient has consented to  Procedure(s): LEFT HEART CATH AND CORS/GRAFTS ANGIOGRAPHY (N/A) as a surgical intervention.  The patient's history has been reviewed, patient examined, no change in status, stable for surgery.  I have reviewed the patient's chart and labs.  Questions were answered to the patient's satisfaction.     Larae Grooms

## 2022-03-15 NOTE — Progress Notes (Signed)
Site area- right  Site Prior to Removal- 0   Pressure Applied For-  20 MInutes   Bedrest Beginning at - 1534   Manual- Yes   Patient Status During Pull- Stable    Post Pull Groin Site- 0   Post Pull Instructions Given- Yes   Post Pull Pulses Present- Yes    Dressing Applied- Tegaderm and Gauze Dressing    Comments:

## 2022-03-16 ENCOUNTER — Encounter (HOSPITAL_COMMUNITY): Payer: Self-pay | Admitting: Interventional Cardiology

## 2022-03-16 ENCOUNTER — Other Ambulatory Visit (HOSPITAL_COMMUNITY): Payer: Self-pay

## 2022-03-16 DIAGNOSIS — I25110 Atherosclerotic heart disease of native coronary artery with unstable angina pectoris: Secondary | ICD-10-CM

## 2022-03-16 DIAGNOSIS — Z9861 Coronary angioplasty status: Secondary | ICD-10-CM

## 2022-03-16 DIAGNOSIS — E876 Hypokalemia: Secondary | ICD-10-CM | POA: Diagnosis not present

## 2022-03-16 DIAGNOSIS — I2510 Atherosclerotic heart disease of native coronary artery without angina pectoris: Secondary | ICD-10-CM | POA: Diagnosis not present

## 2022-03-16 DIAGNOSIS — I4819 Other persistent atrial fibrillation: Secondary | ICD-10-CM | POA: Diagnosis not present

## 2022-03-16 DIAGNOSIS — I5031 Acute diastolic (congestive) heart failure: Secondary | ICD-10-CM

## 2022-03-16 DIAGNOSIS — I25700 Atherosclerosis of coronary artery bypass graft(s), unspecified, with unstable angina pectoris: Secondary | ICD-10-CM

## 2022-03-16 DIAGNOSIS — R9439 Abnormal result of other cardiovascular function study: Secondary | ICD-10-CM | POA: Diagnosis not present

## 2022-03-16 LAB — CBC
HCT: 37.2 % (ref 36.0–46.0)
Hemoglobin: 12.9 g/dL (ref 12.0–15.0)
MCH: 35.8 pg — ABNORMAL HIGH (ref 26.0–34.0)
MCHC: 34.7 g/dL (ref 30.0–36.0)
MCV: 103.3 fL — ABNORMAL HIGH (ref 80.0–100.0)
Platelets: 123 10*3/uL — ABNORMAL LOW (ref 150–400)
RBC: 3.6 MIL/uL — ABNORMAL LOW (ref 3.87–5.11)
RDW: 13 % (ref 11.5–15.5)
WBC: 4.3 10*3/uL (ref 4.0–10.5)
nRBC: 0 % (ref 0.0–0.2)

## 2022-03-16 LAB — MAGNESIUM: Magnesium: 1.6 mg/dL — ABNORMAL LOW (ref 1.7–2.4)

## 2022-03-16 LAB — BASIC METABOLIC PANEL
Anion gap: 9 (ref 5–15)
Anion gap: 9 (ref 5–15)
BUN: 10 mg/dL (ref 8–23)
BUN: 10 mg/dL (ref 8–23)
CO2: 25 mmol/L (ref 22–32)
CO2: 26 mmol/L (ref 22–32)
Calcium: 8.4 mg/dL — ABNORMAL LOW (ref 8.9–10.3)
Calcium: 8.9 mg/dL (ref 8.9–10.3)
Chloride: 103 mmol/L (ref 98–111)
Chloride: 103 mmol/L (ref 98–111)
Creatinine, Ser: 0.89 mg/dL (ref 0.44–1.00)
Creatinine, Ser: 0.82 mg/dL (ref 0.44–1.00)
GFR, Estimated: >60 mL/min (ref >60)
GFR, Estimated: >60 mL/min (ref >60)
Glucose, Bld: 118 mg/dL — ABNORMAL HIGH (ref 70–99)
Glucose, Bld: 110 mg/dL — ABNORMAL HIGH (ref 70–99)
Potassium: 2.9 mmol/L — ABNORMAL LOW (ref 3.5–5.1)
Potassium: 3.6 mmol/L (ref 3.5–5.1)
Sodium: 137 mmol/L (ref 135–145)
Sodium: 138 mmol/L (ref 135–145)

## 2022-03-16 MED ORDER — CLOPIDOGREL BISULFATE 75 MG PO TABS
75.0000 mg | ORAL_TABLET | Freq: Every day | ORAL | 6 refills | Status: DC
  Filled 2022-03-16: qty 30, 30d supply, fill #0

## 2022-03-16 MED ORDER — SPIRONOLACTONE 12.5 MG HALF TABLET
12.5000 mg | ORAL_TABLET | Freq: Every day | ORAL | Status: DC
  Administered 2022-03-16: 12.5 mg via ORAL
  Filled 2022-03-16: qty 1

## 2022-03-16 MED ORDER — APIXABAN 5 MG PO TABS
5.0000 mg | ORAL_TABLET | Freq: Two times a day (BID) | ORAL | Status: DC
  Administered 2022-03-16: 5 mg via ORAL
  Filled 2022-03-16: qty 1

## 2022-03-16 MED ORDER — POTASSIUM CHLORIDE CRYS ER 20 MEQ PO TBCR: 40.0000 meq | EXTENDED_RELEASE_TABLET | Freq: Once | ORAL | Status: DC

## 2022-03-16 MED ORDER — SPIRONOLACTONE 25 MG PO TABS
12.5000 mg | ORAL_TABLET | Freq: Every day | ORAL | 6 refills | Status: DC
  Filled 2022-03-16: qty 30, 60d supply, fill #0

## 2022-03-16 MED ORDER — POTASSIUM CHLORIDE 10 MEQ/100ML IV SOLN
10.0000 meq | Freq: Once | INTRAVENOUS | Status: AC
  Administered 2022-03-16: 10 meq via INTRAVENOUS
  Filled 2022-03-16: qty 100

## 2022-03-16 NOTE — Discharge Summary (Addendum)
Discharge Summary    Patient ID: Monica Stafford MRN: [id number]; DOB: Apr 12, 1941  Admit date: 03/15/2022 Discharge date: 03/16/2022  PCP:  Monica Evens, MD   Monica Stafford Providers Cardiologist:  Monica Dolly, MD   {   Discharge Diagnoses    Principal Problem:   CAD (coronary artery disease) Active Problems:   Abnormal stress test   Persistent atrial fibrillation    Acute diastolic CHF   Hypokalemia >> resolved   Diagnostic Studies/Procedures    CORONARY STENT INTERVENTION  LEFT HEART CATH AND CORS/GRAFTS ANGIOGRAPHY   Conclusion      Mid RCA lesion is 90% stenosed.A drug-eluting stent was successfully placed using a SYNERGY XD 3.0X16, postdilated to 3.5 mm.   Post intervention, there is a 0% residual stenosis.   Based on no competitive flow in the distal RCA, it was concluded that the [place] graft to RCA is occluded.   2nd Diag lesion is 75% stenosed.  SVG to diagonal is patent.   1st Mrg lesion is 100% stenosed.   Origin to Prox Graft lesion is 90% stenosed of the SVG to OM 2.  A drug-eluting stent was successfully placed using a SYNERGY XD 2.25X16, later to high pressure   Post intervention, there is a 0% residual stenosis.   Mid LAD lesion is 100% stenosed.  LIMA could not be selectively engaged.  There did appear to be washout of contrast in the mid to distal LAD on the image of the SVG to diagonal.  Since the native LAD is occluded, it seems this must be coming from a LIMA graft.   .   The left ventricular systolic function is normal.   LV end diastolic pressure is mildly elevated.   The left ventricular ejection fraction is 55-65% by visual estimate.   There is no aortic valve stenosis.   Successful two-vessel PCI of the mid RCA and the SVG to OM.  LVEDP mildly elevated.  She did receive a fair amount of contrast for this procedure.  We will hydrate overnight.  She may need a dose of Lasix tomorrow prior to discharge.   Results were conveyed to  the husband Monica Stafford [phone]  Diagnostic Dominance: Right  Intervention     History of Present Illness     Monica Stafford is a 81 y.o. female with history of CAD (s/p CABG in 2002 with LIMA-LAD, RIMA-RCA, SVG-OM1, SVG-D2, low-risk NST in 03/2017), carotid artery stenosis (s/p L CEA in 12/2000), persistent atrial fibrillation, mitral regurgitation (mild to moderate by echo in 2021), HTN, HLD and prior syncope presented for outpatient cath.   She was last examined by Monica Stafford in 10/2021 and denied any recent chest pain or dyspnea on exertion at that time. She had previously experienced lower extremity edema with Amlodipine 10 mg daily and this had improved with dose reduction to 5 mg daily. No changes were made to her cardiac medications at that time.   Seen 03/01/22 with worsening dyspnea on exertion and fatigue over the past month. Symptoms were concerning for angina. Follow up stress test consistent with prior small basal to mid inferior myocardial infarction with moderate peri-infarct ischemia. Prior small apical infarct with moderate peri-infarct ischemia.  The study is intermediate risk.  Cath arranged.   Hospital Course     Consultants: None  CAD both native vessel and graft, with progressive angina; CAD-PCI Cath showed patent LIMA to LAD and graft to 2nd dig. Occluded RIMA to RPDA and graft to 1st  mg. There was 90% stenosed mRCA. Underwent successful two-vessel PCI of the mid RCA and the SVG to OM.  LVEDP mildly elevated. Hydrated overnight due high contrast use.  Added plavix. Resumed Eliquis. Continued BB and Lipitor. No chest pain. Ambulated well.   2. Hypokalemia - recent hypokalemia despite using supppliment - K was 2.9 post PCI. Given home Kdur 38mq and IV potassium. Started sprio 12.528mqd.  - K improved to 3.6  3. Acute diastolic CHF - Elevated LVEDP at cath. Not glossely volume overloaded on exam  - She will continue low dose lasix at 203md - Added spiro  4.  HLD -No results found for requested labs within last 365 days.  - Continue home Lipitor 1m3m - Consider lab at outpatient and adjustment   5. Persistent afib - Rate controlled.  - Continue BB and Eliquis    Did the patient have an acute coronary syndrome (MI, NSTEMI, STEMI, etc) this admission?:  No                               Did the patient have a percutaneous coronary intervention (stent / angioplasty)?:  Yes.     Cath/PCI Registry Performance & Quality Measures: Aspirin prescribed? - No - due to need of anticoagulation  ADP Receptor Inhibitor (Plavix/Clopidogrel, Brilinta/Ticagrelor or Effient/Prasugrel) prescribed (includes medically managed patients)? - Yes High Intensity Statin (Lipitor 40-80mg33mCrestor 20-1mg)64mscribed? - Yes For EF <40%, was ACEI/ARB prescribed? - Not Applicable (EF >/= 40%) F58%EF <40%, Aldosterone Antagonist (Spironolactone or Eplerenone) prescribed? - Yes Cardiac Rehab Phase II ordered? - Yes   Discharge Vitals Blood pressure 127/65, pulse 86, temperature 98.8 F (37.1 C), temperature source Oral, resp. rate 19, height _0  (1.727 m), weight 83.5 kg, SpO2 96 %.  Filed Weights   03/15/22 0720 03/15/22 1715  Weight: 81.6 kg 83.5 kg   Physical Exam Constitutional:      Appearance: Normal appearance.  HENT:     Head: Normocephalic.     Nose: Nose normal.  Eyes:     Extraocular Movements: Extraocular movements intact.     Pupils: Pupils are equal, round, and reactive to light.  Cardiovascular:     Rate and Rhythm: Normal rate and regular rhythm.     Comments: Cath site without hematoma  Pulmonary:     Effort: Pulmonary effort is normal.     Breath sounds: Normal breath sounds.  Abdominal:     General: Abdomen is flat.     Palpations: Abdomen is soft.  Musculoskeletal:        General: Normal range of motion.     Cervical back: Normal range of motion.  Skin:    General: Skin is warm and dry.  Neurological:     General: No focal  deficit present.     Mental Status: She is alert and oriented to person, place, and time.  Psychiatric:        Mood and Affect: Mood normal.        Behavior: Behavior normal.     Labs & Radiologic Studies    CBC Recent Labs    03/16/22 0148  WBC 4.3  HGB 12.9  HCT 37.2  MCV 103.3*  PLT 123*  527sic Metabolic Panel Recent Labs    03/16/22 0148 03/16/22 1256  NA 137 138  K 2.9* 3.6  CL 103 103  CO2 25 26  GLUCOSE 118* 110*  BUN 10 10  CREATININE 0.89 0.82  CALCIUM 8.4* 8.9  MG  --  1.6*   _____________  CARDIAC CATHETERIZATION  Result Date: 03/15/2022   Mid RCA lesion is 90% stenosed.A drug-eluting stent was successfully placed using a SYNERGY XD 3.0X16, postdilated to 3.5 mm.   Post intervention, there is a 0% residual stenosis.   Based on no competitive flow in the distal RCA, it was concluded that the [place] graft to RCA is occluded.   2nd Diag lesion is 75% stenosed.  SVG to diagonal is patent.   1st Mrg lesion is 100% stenosed.   Origin to Prox Graft lesion is 90% stenosed of the SVG to OM 2.  A drug-eluting stent was successfully placed using a SYNERGY XD 2.25X16, later to high pressure   Post intervention, there is a 0% residual stenosis.   Mid LAD lesion is 100% stenosed.  LIMA could not be selectively engaged.  There did appear to be washout of contrast in the mid to distal LAD on the image of the SVG to diagonal.  Since the native LAD is occluded, it seems this must be coming from a LIMA graft.   .   The left ventricular systolic function is normal.   LV end diastolic pressure is mildly elevated.   The left ventricular ejection fraction is 55-65% by visual estimate.   There is no aortic valve stenosis. Successful two-vessel PCI of the mid RCA and the SVG to OM.  LVEDP mildly elevated.  She did receive a fair amount of contrast for this procedure.  We will hydrate overnight.  She may need a dose of Lasix tomorrow prior to discharge. Results were conveyed to the husband Monica Stafford  [phone]   NM Myocar Multi W/Spect W/Wall Motion / EF  Result Date: 03/06/2022   Findings are consistent with prior small basal to mid inferior myocardial infarction with moderate peri-infarct ischemia. Prior small apical infarct with moderate peri-infarct ischemia.  The study is intermediate risk.   No ST deviation was noted.   LV perfusion is abnormal. There is small moderate intensity basal to mid inferior defect that is moderately reversible. There is small moderate intensity apical defect with moderate reversibility.   Left ventricular function is normal. End diastolic cavity size is normal.   Disposition   Pt is being discharged home today in good condition.  Follow-up Plans & Appointments     Follow-up Information     [facility], Crista Luria, [place] Follow up on 03/30/2022.   Specialty: Cardiology Why: 11:20am for hospital follow up Contact information: [place] [phone]         [facility] Follow up on 03/23/2022.   Specialty: Cardiology Why: for blood work Contact information: [place] [phone]               Discharge Instructions     Amb Referral to Cardiac Rehabilitation   Complete by: As directed    Diagnosis: Coronary Stents   After initial evaluation and assessments completed: Virtual Based Care may be provided alone or in conjunction with Phase 2 Cardiac Rehab based on patient barriers.: Yes   Intensive Cardiac Rehabilitation (ICR) [place] location only OR Traditional Cardiac Rehabilitation (TCR) *If criteria for ICR are not met will enroll in TCR [facility] only): Yes   Diet - low sodium heart healthy   Complete by: As directed    Diet - low sodium heart healthy  Complete by: As directed    Discharge instructions   Complete by: As directed    No driving for 48 hours. No lifting over 5 lbs for 1 week. No sexual activity for 1 week. You may return to work on 03/20/22.  Keep procedure site clean & dry. If you notice increased pain, swelling, bleeding or pus, call/return!  You may shower, but no soaking baths/hot tubs/pools for 1 week.   Discharge instructions   Complete by: As directed    No driving for 48 hours. No lifting over 5 lbs for 1 week. No sexual activity for 1 week. Keep procedure site clean & dry. If you notice increased pain, swelling, bleeding or pus, call/return!  You may shower, but no soaking baths/hot tubs/pools for 1 week.   Increase activity slowly   Complete by: As directed    Increase activity slowly   Complete by: As directed        Discharge Medications   Allergies as of 03/16/2022       Reactions   Sulfonamide Derivatives Itching        Medication List     TAKE these medications    acetaminophen 500 MG tablet Commonly known as: TYLENOL Take 1,000 mg by mouth 3 (three) times daily as needed for mild pain or moderate pain.   alendronate 70 MG tablet Commonly known as: FOSAMAX Take 70 mg by mouth every Monday.   ALPRAZolam 1 MG tablet Commonly known as: XANAX Take 0.25-0.5 mg by mouth 3 (three) times daily as needed for anxiety.   amLODipine 5 MG tablet Commonly known as: NORVASC Take 1 tablet by mouth once daily   atorvastatin 40 MG tablet Commonly known as: LIPITOR Take 40 mg by mouth every evening.   Calcium Carbonate-Vitamin D 600-400 MG-UNIT tablet Take 1-2 tablets by mouth See admin instructions. Take 1 tablet in the morning & take 2 tablets in the evening.   clopidogrel 75 MG tablet Commonly known as: PLAVIX Take 1 tablet (75 mg total) by mouth daily with breakfast. Start taking on: March 17, 2022   Eliquis 5 MG Tabs tablet Generic drug: apixaban Take 1 tablet by mouth twice daily   estradiol 0.1 MG/GM vaginal cream Commonly known as: ESTRACE Place 1 g vaginally every other day as needed (irritation.).   fish oil-omega-3 fatty acids 1000 MG capsule Take 1 g by mouth every evening.    furosemide 20 MG tablet Commonly known as: Lasix Take 1 tablet (20 mg total) by mouth daily.   lisinopril 10 MG tablet Commonly known as: ZESTRIL Take 10 mg by mouth 2 (two) times daily.   metoprolol tartrate 25 MG tablet Commonly known as: LOPRESSOR Take 1/2 (one-half) tablet by mouth twice daily   multivitamin with minerals Tabs tablet Take 1 tablet by mouth in the morning.   nitroGLYCERIN 0.4 MG SL tablet Commonly known as: NITROSTAT Place 1 tablet (0.4 mg total) under the tongue every 5 (five) minutes as needed for chest pain.   potassium chloride SA 20 MEQ tablet Commonly known as: KLOR-CON M Take 3 tablets (60 mEq total) by mouth daily.   raloxifene 60 MG tablet Commonly known as: EVISTA Take 60 mg by mouth every other day. In the morning   spironolactone 25 MG tablet Commonly known as: ALDACTONE Take 0.5 tablets (12.5 mg total) by mouth daily.   Systane 0.4-0.3 % Soln Generic drug: Polyethyl Glycol-Propyl Glycol Place 1 drop into both eyes 4 (four) times daily as needed (dry/irritated eyes.).  Outstanding Labs/Studies    BMP in 1 week  Duration of Discharge Encounter   Greater than 30 minutes including physician time.  SignedCrista Luria [place], PA 03/16/2022, 2:14 PM   ATTENDING ATTESTATION  I have seen, examined and evaluated the patient this morning on rounds along with Mr. Curly [place], [place].  After reviewing all the available data and chart, we discussed the patients laboratory, study & physical findings as well as symptoms in detail.  I agree with his findings, examination as well as hospital summary with impressions and recommendations as per our discussion.    Overnight [place] after PCI.  Late catheterization procedure with 2 site PCI length including vein graft and native vessel.  Feels well.  No major issues.  Ambulated without difficulty.  Potassium level is pretty low.  Was given a dose of IV potassium as well as oral.  Need to closely  monitor.  I agree with adding spironolactone.  She is otherwise stable for discharge and follow-up with PCP and primary cardiologist. Monica Dolly, MD      Leonie Man, MD, MS Glenetta Hew, M.D., M.S. Interventional Cardiologist  [place]  Pager # [phone] Phone # [phone] [address] [place], [place] [address]

## 2022-03-16 NOTE — Progress Notes (Signed)
CARDIAC REHAB PHASE I   Pt sitting up in chair has just finished breakfast. Pt declined walk in hall, reports walking around room with no cp or sob. Pt states I walk, exercise and still work, I don't need to walk with you. Pt eager for discharge home. Post stent education including site care, risk factors, heart healthy diet, exercise guidelines, restrictions, and CRP2 reviewed. Will refer to AP for CRP2. All questions and concerns addressed. Plan for home today.  [phone]  Vanessa Barbara, RN BSN 03/16/2022 9:12 AM

## 2022-03-17 LAB — LIPOPROTEIN A (LPA): Lipoprotein (a): 207.1 nmol/L — ABNORMAL HIGH (ref <75.0)

## 2022-03-19 ENCOUNTER — Telehealth: Payer: Self-pay | Admitting: Cardiology

## 2022-03-19 DIAGNOSIS — Z79899 Other long term (current) drug therapy: Secondary | ICD-10-CM

## 2022-03-19 NOTE — Telephone Encounter (Signed)
Potassium was 2.9 on 03/16/22 after starting potassium 60 meq daily on 03/08/22   I will message provider.

## 2022-03-19 NOTE — Telephone Encounter (Signed)
Patient states she had a stent put in and was told to have lab work done. She would like to know if the right lab orders were put in for her.

## 2022-03-19 NOTE — Telephone Encounter (Signed)
Order placed for BMET and Mg to be done later this week. Pt notified to have labs done at [facility] this week.

## 2022-03-23 ENCOUNTER — Other Ambulatory Visit (HOSPITAL_COMMUNITY)
Admission: RE | Admit: 2022-03-23 | Discharge: 2022-03-23 | Disposition: A | Discharge status: Home / Self Care | Payer: Medicare HMO | Source: Ambulatory Visit | Attending: Student | Admitting: Student

## 2022-03-23 DIAGNOSIS — Z79899 Other long term (current) drug therapy: Secondary | ICD-10-CM | POA: Diagnosis present

## 2022-03-23 LAB — MAGNESIUM: Magnesium: 1.9 mg/dL (ref 1.7–2.4)

## 2022-03-23 LAB — BASIC METABOLIC PANEL
Anion gap: 6 (ref 5–15)
BUN: 18 mg/dL (ref 8–23)
CO2: 29 mmol/L (ref 22–32)
Calcium: 9.5 mg/dL (ref 8.9–10.3)
Chloride: 103 mmol/L (ref 98–111)
Creatinine, Ser: 0.94 mg/dL (ref 0.44–1.00)
GFR, Estimated: >60 mL/min (ref >60)
Glucose, Bld: 102 mg/dL — ABNORMAL HIGH (ref 70–99)
Potassium: 4.2 mmol/L (ref 3.5–5.1)
Sodium: 138 mmol/L (ref 135–145)

## 2022-03-26 ENCOUNTER — Telehealth: Payer: Self-pay

## 2022-03-26 NOTE — Telephone Encounter (Signed)
Patient notified and verbalized understanding. Patient had no questions or concerns at this time. PCP copied 

## 2022-03-26 NOTE — Telephone Encounter (Signed)
-----   Message from Erma Heritage, [place] sent at 03/23/2022  4:17 PM EDT ----- Please let the patient know that her electrolytes are within a normal range (including potassium) and kidney function remains stable. Continue current medical therapy.

## 2022-03-30 ENCOUNTER — Ambulatory Visit: Payer: Medicare HMO | Attending: Physician Assistant | Admitting: Student

## 2022-03-30 ENCOUNTER — Encounter: Payer: Self-pay | Admitting: Student

## 2022-03-30 ENCOUNTER — Encounter: Payer: Self-pay | Admitting: *Deleted

## 2022-03-30 ENCOUNTER — Ambulatory Visit: Payer: Medicare HMO | Admitting: Physician Assistant

## 2022-03-30 VITALS — BP 118/60 | HR 60 | Ht 68.0 in | Wt 175.6 lb

## 2022-03-30 DIAGNOSIS — I6523 Occlusion and stenosis of bilateral carotid arteries: Secondary | ICD-10-CM | POA: Diagnosis not present

## 2022-03-30 DIAGNOSIS — I2510 Atherosclerotic heart disease of native coronary artery without angina pectoris: Secondary | ICD-10-CM

## 2022-03-30 DIAGNOSIS — I10 Essential (primary) hypertension: Secondary | ICD-10-CM

## 2022-03-30 DIAGNOSIS — I340 Nonrheumatic mitral (valve) insufficiency: Secondary | ICD-10-CM

## 2022-03-30 DIAGNOSIS — I4819 Other persistent atrial fibrillation: Secondary | ICD-10-CM | POA: Diagnosis not present

## 2022-03-30 DIAGNOSIS — E785 Hyperlipidemia, unspecified: Secondary | ICD-10-CM

## 2022-03-30 NOTE — Patient Instructions (Signed)
Medication Instructions:  Your physician recommends that you continue on your current medications as directed. Please refer to the Current Medication list given to you today.  *If you need a refill on your cardiac medications before your next appointment, please call your pharmacy*   Lab Work: NONE   If you have labs (blood work) drawn today and your tests are completely normal, you will receive your results only by: MyChart Message (if you have MyChart) OR A paper copy in the mail If you have any lab test that is abnormal or we need to change your treatment, we will call you to review the results.   Testing/Procedures: NONE    Follow-Up: At [facility], you and your health needs are our priority.  As part of our continuing mission to provide you with exceptional heart care, we have created designated Provider Care Teams.  These Care Teams include your primary Cardiologist (physician) and Advanced Practice Providers (APPs -  Physician Assistants and Nurse Practitioners) who all work together to provide you with the care you need, when you need it.  We recommend signing up for the patient portal called "MyChart".  Sign up information is provided on this After Visit Summary.  MyChart is used to connect with patients for Virtual Visits (Telemedicine).  Patients are able to view lab/test results, encounter notes, upcoming appointments, etc.  Non-urgent messages can be sent to your provider as well.   To learn more about what you can do with MyChart, go to [email or web address].    Your next appointment:   3-4  month(s)  The format for your next appointment:   In Person  Provider:   Jonathan Branch, MD    Other Instructions Thank you for choosing [facility]!    Important Information About Sugar       

## 2022-03-30 NOTE — Progress Notes (Signed)
Cardiology Office Note    Date:  03/30/2022   ID:  Monica Stafford, Monica Stafford 1941-01-26, MRN [id number]  PCP:  Lemmie Evens, MD  Cardiologist: Carlyle Dolly, MD    Chief Complaint  Patient presents with   Follow-up    S/p cardiac catheterization    History of Present Illness:    Monica Stafford is a 81 y.o. female with past medical history of CAD (s/p CABG in 2002 with LIMA-LAD, RIMA-RCA, SVG-OM1, SVG-D2, low-risk NST in 03/2017), carotid artery stenosis (s/p L CEA in 12/2000), paroxysmal atrial fibrillation, mitral regurgitation (mild to moderate by echo in 2021), HTN, HLD and prior syncope who presents to the office today for follow-up from her recent cardiac catheterization.  She was examined by myself in 02/2022 and reported worsening dyspnea on exertion for the past few weeks along with worsening fatigue. A stress test was recommended for initial evaluation and did show evidence of prior infarct with moderate peri-infarct ischemia and was intermediate risk study. Given this and her symptoms, a cardiac catheterization was recommended for definitive evaluation. This was performed by Dr. Irish Lack on 03/15/2022 and showed 90% stenosis of the mid RCA with occlusion of the RIMA graft RCA, patent SVG to diagonal, 90% stenosis of the SVG to OM 2 and the LIMA could not be selectively engaged but did appear to be washout contrast in the mid to distal LAD. She underwent successful two-vessel PCI with DES placement to the native mid RCA and DES to the SVG to OM. She was started on Plavix 75 mg daily while being continued on Eliquis, beta-blocker and Lipitor. She did receive hydration following her procedure and was monitored overnight and discharged home the following day. She was started on Spironolactone 12.5 mg daily and K-dur was increased to 60 mEq daily given hypokalemia. Follow-up labs on 03/23/2022 showed her K+ had normalized to 4.2 and creatinine was stable at 0.94.  In talking with  the patient today, she reports her dyspnea has improved following recent stent placement and she has also noticed more energy. Reports her legs were also previously heavy and this has now resolved.  She denies any specific chest pain but was not having chest pain prior to stent placement as her prior anginal symptoms had been dyspnea. No recent orthopnea, PND or pitting edema. She reports good compliance with Plavix and Eliquis and experiences easy bruising but no melena, hematochezia or hematuria.  Past Medical History:  Diagnosis Date   Arthritis    back  all over   CAD (coronary artery disease)    a. s/p CABG in 2002 with LIMA-LAD, RIMA-RCA, SVG-OM1, SVG-D2 b. low-risk NST in 03/2017 c. cath in 02/2022 with DES to mid-RCA and DES to SVG-OM   Hyperlipidemia    Hypertension    PAF (paroxysmal atrial fibrillation) ([place])    Peripheral vascular disease ([place])    Pneumonia     50 years  ago   Sleep apnea      study but dont wear   mask    Past Surgical History:  Procedure Laterality Date   ABDOMINAL HYSTERECTOMY  1998   [facility]   CARDIAC CATHETERIZATION     CATARACT EXTRACTION W/PHACO  12/10/2011   Procedure: CATARACT EXTRACTION PHACO AND INTRAOCULAR LENS PLACEMENT ([place]);  Surgeon: Tonny Branch, MD;  Location: [facility];  Service: Ophthalmology;  Laterality: Right;  CDE:10.40   CATARACT EXTRACTION W/PHACO  01/07/2012   Procedure: CATARACT EXTRACTION PHACO AND INTRAOCULAR LENS PLACEMENT ([facility]);  Surgeon: Gemma Payor, MD;  Location: [facility];  Service: Ophthalmology;  Laterality: Left;  CDE 8.38   CORONARY ARTERY BYPASS GRAFT  12/23/2000   [facility]   CORONARY STENT INTERVENTION N/A 03/15/2022   Procedure: CORONARY STENT INTERVENTION;  Surgeon: Corky Crafts, MD;  Location: [facility] INVASIVE CV LAB;  Service: Cardiovascular;  Laterality: N/A;   LAPAROSCOPIC APPENDECTOMY N/A 02/27/2016   Procedure: APPENDECTOMY LAPAROSCOPIC;  Surgeon: Franky Macho, MD;  Location: [facility];  Service:  General;  Laterality: N/A;   LEFT HEART CATH AND CORS/GRAFTS ANGIOGRAPHY N/A 03/15/2022   Procedure: LEFT HEART CATH AND CORS/GRAFTS ANGIOGRAPHY;  Surgeon: Corky Crafts, MD;  Location: [facility] INVASIVE CV LAB;  Service: Cardiovascular;  Laterality: N/A;    Current Medications: Outpatient Medications Prior to Visit  Medication Sig Dispense Refill   acetaminophen (TYLENOL) 500 MG tablet Take 1,000 mg by mouth 3 (three) times daily as needed for mild pain or moderate pain.     alendronate (FOSAMAX) 70 MG tablet Take 70 mg by mouth every Monday.     ALPRAZolam (XANAX) 1 MG tablet Take 0.25-0.5 mg by mouth 3 (three) times daily as needed for anxiety.     amLODipine (NORVASC) 5 MG tablet Take 1 tablet by mouth once daily 90 tablet 1   apixaban (ELIQUIS) 5 MG TABS tablet Take 1 tablet by mouth twice daily 180 tablet 1   atorvastatin (LIPITOR) 40 MG tablet Take 40 mg by mouth every evening.     Calcium Carbonate-Vitamin D 600-400 MG-UNIT tablet Take 1-2 tablets by mouth See admin instructions. Take 1 tablet in the morning & take 2 tablets in the evening.     clopidogrel (PLAVIX) 75 MG tablet Take 1 tablet (75 mg total) by mouth daily with breakfast. 30 tablet 6   estradiol (ESTRACE) 0.1 MG/GM vaginal cream Place 1 g vaginally every other day as needed (irritation.).     fish oil-omega-3 fatty acids 1000 MG capsule Take 1 g by mouth every evening.     furosemide (LASIX) 20 MG tablet Take 1 tablet (20 mg total) by mouth daily. 90 tablet 3   lisinopril (ZESTRIL) 10 MG tablet Take 10 mg by mouth 2 (two) times daily.     metoprolol tartrate (LOPRESSOR) 25 MG tablet Take 1/2 (one-half) tablet by mouth twice daily 90 tablet 1   Multiple Vitamin (MULTIVITAMIN WITH MINERALS) TABS Take 1 tablet by mouth in the morning.     nitroGLYCERIN (NITROSTAT) 0.4 MG SL tablet Place 1 tablet (0.4 mg total) under the tongue every 5 (five) minutes as needed for chest pain. 25 tablet 1   Polyethyl Glycol-Propyl Glycol  (SYSTANE) 0.4-0.3 % SOLN Place 1 drop into both eyes 4 (four) times daily as needed (dry/irritated eyes.).     potassium chloride SA (KLOR-CON M) 20 MEQ tablet Take 3 tablets (60 mEq total) by mouth daily. 270 tablet 3   raloxifene (EVISTA) 60 MG tablet Take 60 mg by mouth every other day. In the morning     spironolactone (ALDACTONE) 25 MG tablet Take 0.5 tablets (12.5 mg total) by mouth daily. (Patient not taking: Reported on 03/30/2022) 30 tablet 6   No facility-administered medications prior to visit.     Allergies:   Sulfonamide derivatives   Social History   Socioeconomic History   Marital status: Divorced    Spouse name: Not on file   Number of children: Not on file   Years of education: Not on file   Highest education level:  Not on file  Occupational History   Not on file  Tobacco Use   Smoking status: Never    Passive exposure: Never   Smokeless tobacco: Never  Vaping Use   Vaping Use: Never used  Substance and Sexual Activity   Alcohol use: No    Alcohol/week: 0.0 standard drinks of alcohol   Drug use: No   Sexual activity: Not Currently  Other Topics Concern   Not on file  Social History Narrative   Not on file   Social Determinants of Health   Financial Resource Strain: Not on file  Food Insecurity: No Food Insecurity (03/16/2022)   Hunger Vital Sign    Worried About Running Out of Food in the Last Year: Never true    Ran Out of Food in the Last Year: Never true  Transportation Needs: No Transportation Needs (03/16/2022)   PRAPARE - Hydrologist (Medical): No    Lack of Transportation (Non-Medical): No  Physical Activity: Not on file  Stress: Not on file  Social Connections: Not on file     Family History:  The patient's family history includes Colitis in her sister; Heart attack in her father; Heart disease in her father; Parkinson's disease in her sister.   Review of Systems:    Please see the history of present illness.      All other systems reviewed and are otherwise negative except as noted above.   Physical Exam:    VS:  BP 118/60   Pulse 60   Ht 5\' 8"  (1.727 m)   Wt 175 lb 9.6 oz (79.7 kg)   SpO2 97%   BMI 26.70 kg/m    General: Well developed, well nourished,female appearing in no acute distress. Head: Normocephalic, atraumatic. Neck: No carotid bruits. JVD not elevated.  Lungs: Respirations regular and unlabored, without wheezes or rales.  Heart: Irregularly irregular. No S3 or S4.  No murmur, no rubs, or gallops appreciated. Abdomen: Appears non-distended. No obvious abdominal masses. Msk:  Strength and tone appear normal for age. No obvious joint deformities or effusions. Extremities: No clubbing or cyanosis. No pitting edema.  Distal pedal pulses are 2+ bilaterally. Ecchymosis along right forearm (from IV during admission per patient's report but has been improving).  Neuro: Alert and oriented X 3. Moves all extremities spontaneously. No focal deficits noted. Psych:  Responds to questions appropriately with a normal affect. Skin: No rashes or lesions noted  Wt Readings from Last 3 Encounters:  03/30/22 175 lb 9.6 oz (79.7 kg)  03/15/22 184 lb 1.4 oz (83.5 kg)  03/01/22 180 lb 12.8 oz (82 kg)     Studies/Labs Reviewed:   EKG:  EKG is not ordered today. EKG from 03/16/2022 is reviewed and shows rate-controlled atrial fibrillation, HR 74 with PVC's.   Recent Labs: 03/01/2022: ALT 30; B Natriuretic Peptide 229.0; TSH 1.250 03/16/2022: Hemoglobin 12.9; Platelets 123 03/23/2022: BUN 18; Creatinine, Ser 0.94; Magnesium 1.9; Potassium 4.2; Sodium 138   Lipid Panel    Component Value Date/Time   CHOL 100 03/20/2017 0142   TRIG 74 03/20/2017 0142   HDL 42 03/20/2017 0142   CHOLHDL 2.4 03/20/2017 0142   VLDL 15 03/20/2017 0142   LDLCALC 43 03/20/2017 0142    Additional studies/ records that were reviewed today include:   NST: 02/2022   Findings are consistent with prior small basal  to mid inferior myocardial infarction with moderate peri-infarct ischemia. Prior small apical infarct with moderate peri-infarct ischemia.  The study is intermediate risk.   No ST deviation was noted.   LV perfusion is abnormal. There is small moderate intensity basal to mid inferior defect that is moderately reversible. There is small moderate intensity apical defect with moderate reversibility.   Left ventricular function is normal. End diastolic cavity size is normal.  Cardiac Catheterization: 02/2022 Mid RCA lesion is 90% stenosed.A drug-eluting stent was successfully placed using a SYNERGY XD 3.0X16, postdilated to 3.5 mm.   Post intervention, there is a 0% residual stenosis.   Based on no competitive flow in the distal RCA, it was concluded that the [place] graft to RCA is occluded.   2nd Diag lesion is 75% stenosed.  SVG to diagonal is patent.   1st Mrg lesion is 100% stenosed.   Origin to Prox Graft lesion is 90% stenosed of the SVG to OM 2.  A drug-eluting stent was successfully placed using a SYNERGY XD 2.25X16, later to high pressure   Post intervention, there is a 0% residual stenosis.   Mid LAD lesion is 100% stenosed.  LIMA could not be selectively engaged.  There did appear to be washout of contrast in the mid to distal LAD on the image of the SVG to diagonal.  Since the native LAD is occluded, it seems this must be coming from a LIMA graft.   .   The left ventricular systolic function is normal.   LV end diastolic pressure is mildly elevated.   The left ventricular ejection fraction is 55-65% by visual estimate.   There is no aortic valve stenosis.   Successful two-vessel PCI of the mid RCA and the SVG to OM.  LVEDP mildly elevated.  She did receive a fair amount of contrast for this procedure.  We will hydrate overnight.  She may need a dose of Lasix tomorrow prior to discharge.   Results were conveyed to the husband Jeneen Rinks [id number]    Assessment:    1. Coronary artery  disease involving native coronary artery of native heart without angina pectoris   2. Persistent atrial fibrillation (HCC)   3. Carotid stenosis, bilateral   4. Essential hypertension   5. Hyperlipidemia LDL goal <70   6. Mitral valve insufficiency, unspecified etiology      Plan:   In order of problems listed above:  1. CAD - She is s/p CABG in 2002 with LIMA-LAD, RIMA-RCA, SVG-OM1, SVG-D2 and underwent recent repeat catheterization as outlined above and received DES placement to the native mid-RCA and DES to the SVG to OM. - She denies any recent chest pain and has noticed significant improvement in her energy level and dyspnea. - Continue current medical therapy with Plavix 75 mg daily, Lopressor 12.5 mg twice daily and Atorvastatin 40 mg daily. She is not on ASA given the need for anticoagulation.  2. Persistent Atrial Fibrillation - She denies any recent palpitations and her heart rate is well-controlled in the 60's during today's visit.  Remains on Lopressor 12.5 mg twice daily for rate control. - No reports of active bleeding. She is on Eliquis 5 mg twice daily for anticoagulation which is the appropriate dose at this time given her current age, weight and renal function.  3. Carotid Artery Stenosis  - She is s/p L CEA in 12/2000 and Carotid Dopplers in 08/2021 showed 1 to 39% stenosis bilaterally.  Continue Plavix and statin therapy.  4. HTN - Her blood pressure is well controlled at 118/60 during today's visit. Continue current medical therapy with  Amlodipine 5 mg daily, Lasix 20 mg daily, Lisinopril 10 mg twice daily, Lopressor 12.5 mg twice daily and Spironolactone 12.5 mg daily. Her renal function was stable and K+ had normalized by repeat labs last week.   5. HLD - Will request a copy of most recent labs from her PCP. LPA was elevated to 207 during her recent admission. If LDL is not at goal, would have a low threshold for consideration of PCSK9 inhibitor therapy. Remains on  Atorvastatin 40mg  daily.   6. Mitral Regurgitation - She did have mild to moderate mitral valve regurgitation by echocardiogram in 2021. Would anticipate obtaining repeat imaging at her next visit.   Medication Adjustments/Labs and Tests Ordered: Current medicines are reviewed at length with the patient today.  Concerns regarding medicines are outlined above.  Medication changes, Labs and Tests ordered today are listed in the Patient Instructions below. Patient Instructions  Medication Instructions:  Your physician recommends that you continue on your current medications as directed. Please refer to the Current Medication list given to you today.  *If you need a refill on your cardiac medications before your next appointment, please call your pharmacy*   Lab Work: NONE   If you have labs (blood work) drawn today and your tests are completely normal, you will receive your results only by: [place] (if you have MyChart) OR A paper copy in the mail If you have any lab test that is abnormal or we need to change your treatment, we will call you to review the results.   Testing/Procedures: NONE    Follow-Up: At [facility], you and your health needs are our priority.  As part of our continuing mission to provide you with exceptional heart care, we have created designated Provider Care Teams.  These Care Teams include your primary Cardiologist (physician) and Advanced Practice Providers (APPs -  Physician Assistants and Nurse Practitioners) who all work together to provide you with the care you need, when you need it.  We recommend signing up for the patient portal called "MyChart".  Sign up information is provided on this After Visit Summary.  MyChart is used to connect with patients for Virtual Visits (Telemedicine).  Patients are able to view lab/test results, encounter notes, upcoming appointments, etc.  Non-urgent messages can be sent to your provider as well.   To  learn more about what you can do with MyChart, go to [email or web address].    Your next appointment:   3 -4 month(s)  The format for your next appointment:   In Person  Provider:   Carlyle Dolly, MD    Other Instructions Thank you for choosing [facility]!    Important Information About Sugar         Signed, Erma Heritage, PA-C  03/30/2022 4:34 PM    [facility] [address] [place], [place] [address] Phone: [phone] Fax: [phone]

## 2022-04-09 ENCOUNTER — Telehealth: Payer: Self-pay | Admitting: Cardiology

## 2022-04-09 MED ORDER — SPIRONOLACTONE 25 MG PO TABS: 12.5000 mg | ORAL_TABLET | Freq: Every day | ORAL | 1 refills | Status: DC

## 2022-04-09 MED ORDER — CLOPIDOGREL BISULFATE 75 MG PO TABS: 75.0000 mg | ORAL_TABLET | Freq: Every day | ORAL | 1 refills | Status: DC

## 2022-04-09 NOTE — Telephone Encounter (Signed)
*  STAT* If patient is at the pharmacy, call can be transferred to refill team.   1. Which medications need to be refilled? (please list name of each medication and dose if known) clopidogrel (PLAVIX) 75 MG tablet spironolactone (ALDACTONE) 25 MG tablet  2. Which pharmacy/location (including street and city if local pharmacy) is medication to be sent to? [place], [place] [address]  3. Do they need a 30 day or 90 day supply? [place]

## 2022-04-09 NOTE — Telephone Encounter (Signed)
Completed.

## 2022-04-11 ENCOUNTER — Telehealth: Payer: Self-pay | Admitting: Cardiology

## 2022-04-11 NOTE — Telephone Encounter (Signed)
Patient notified that she is on the correct medications. Patient had no other questions at this time.

## 2022-04-11 NOTE — Telephone Encounter (Signed)
Per 03/30/2022 office note- Bernerd Pho, PA-C:  Continue current medical therapy with Plavix 75 mg daily, Lopressor 12.5 mg twice daily and Atorvastatin 40 mg daily. She is not on ASA given the need for anticoagulation.  No reports of active bleeding. She is on Eliquis 5 mg twice daily for anticoagulation which is the appropriate dose at this time given her current age, weight and renal function.

## 2022-04-11 NOTE — Telephone Encounter (Signed)
Pt c/o medication issue:  1. Name of Medication:  clopidogrel (PLAVIX) 75 MG tablet apixaban (ELIQUIS) 5 MG TABS tablet  2. How are you currently taking this medication (dosage and times per day)? Plavix 1 tablet daily, Eliquis 1 tablet twice a day  3. Are you having a reaction (difficulty breathing--STAT)? no  4. What is your medication issue? Patient states her pharmacist told her she was not supposed to take both the eliquis and plavix. She would like to verify.

## 2022-05-04 ENCOUNTER — Telehealth: Payer: Self-pay | Admitting: Cardiology

## 2022-05-04 MED ORDER — POTASSIUM CHLORIDE CRYS ER 20 MEQ PO TBCR: 60.0000 meq | EXTENDED_RELEASE_TABLET | Freq: Every day | ORAL | 3 refills | Status: DC

## 2022-05-04 NOTE — Telephone Encounter (Signed)
Pt c/o medication issue:  1. Name of Medication: potassium chloride SA (KLOR-CON M) 20 MEQ tablet   2. How are you currently taking this medication (dosage and times per day)? As prescribed   3. Are you having a reaction (difficulty breathing--STAT)?   4. What is your medication issue? Pt states her pharmacy will not fill this again until December and she only has 3 days left.

## 2022-05-04 NOTE — Telephone Encounter (Signed)
Patient confirmed she takes 60 meq daily. She states her prescription bottle said "1 of 3". She states she dos not have any extra bottles (we gave her 270 tablets-she should have received more than bottle) She is going to call the pharmacy back.

## 2022-05-08 ENCOUNTER — Ambulatory Visit: Payer: Medicare HMO | Admitting: Cardiology

## 2022-05-14 ENCOUNTER — Other Ambulatory Visit: Payer: Self-pay | Admitting: Cardiology

## 2022-05-14 NOTE — Telephone Encounter (Signed)
Prescription refill request for Eliquis received. Indication: AF Last office visit: 03/30/22  B Strader PA-C Scr: 0.94 on 03/23/22 Age: 81 Weight: 79.7kg  Based on above findings Eliquis 5mg  twice daily is the appropriate dose.  Refill approved.

## 2022-06-29 ENCOUNTER — Other Ambulatory Visit: Payer: Self-pay | Admitting: Family Medicine

## 2022-06-29 DIAGNOSIS — M545 Low back pain, unspecified: Secondary | ICD-10-CM

## 2022-07-02 ENCOUNTER — Other Ambulatory Visit: Payer: Self-pay | Admitting: Cardiology

## 2022-07-03 ENCOUNTER — Encounter: Payer: Self-pay | Admitting: Cardiology

## 2022-07-03 ENCOUNTER — Telehealth: Payer: Self-pay | Admitting: Cardiology

## 2022-07-03 NOTE — Telephone Encounter (Signed)
   Pre-operative Risk Assessment    Patient Name: Monica Stafford  DOB: 08/29/1940 MRN: [id number]      Request for Surgical Clearance    Procedure:   Lumbar Epidural  Date of Surgery:  Clearance TBD                                 Surgeon:  Not indicated Surgeon's Group or Practice Name:  [place] Phone number:  [phone] Fax number:  [phone]   Type of Clearance Requested:   - Medical  - Pharmacy:  Hold Apixaban (Eliquis)     Type of Anesthesia:  Local    Additional requests/questions:    Louretta Shorten   07/03/2022, 10:17 AM

## 2022-07-13 NOTE — Telephone Encounter (Signed)
Requesting office inquiring about clearance.

## 2022-07-13 NOTE — Telephone Encounter (Signed)
Pt is requesting call back to get update on this clearance.  

## 2022-07-16 NOTE — Telephone Encounter (Signed)
   Name: Monica Stafford  DOB: 27-Dec-1940  MRN: [id number]  Primary Cardiologist: Carlyle Dolly, MD   Preoperative team, please contact this patient and set up a phone call appointment for further preoperative risk assessment. Please obtain consent and complete medication review. Thank you for your help.  I confirm that guidance regarding antiplatelet and oral anticoagulation therapy has been completed and, if necessary, noted below (Per office protocol, patient can hold Eliquis for 3 days prior to procedure).    Trudi Ida, NP 07/16/2022, 8:16 AM [place]

## 2022-07-16 NOTE — Telephone Encounter (Signed)
Patient with diagnosis of atrial fibriliation  on Eliquis for anticoagulation.    Procedure: Lumbar Epidural  Date of procedure: TBD   CHA2DS2-VASc Score = 5   This indicates a 7.2% annual risk of stroke. The patient's score is based upon: CHF History: 0 HTN History: 1 Diabetes History: 0 Stroke History: 0 Vascular Disease History: 1 Age Score: 2 Gender Score: 1     CrCl 51 ml/min Adj BW  Platelet count 123     Per office protocol, patient can hold Eliquis for 3 days prior to procedure.     **This guidance is not considered finalized until pre-operative APP has relayed final recommendations.**

## 2022-07-17 NOTE — Telephone Encounter (Signed)
S/w the pt and she has stated she will  wait to see Dr. Harl Bowie for her cardiac clearance. I will update all parties involved.

## 2022-08-03 ENCOUNTER — Ambulatory Visit: Payer: Medicare HMO | Attending: Cardiology | Admitting: Cardiology

## 2022-08-03 ENCOUNTER — Encounter: Payer: Self-pay | Admitting: Cardiology

## 2022-08-03 VITALS — BP 128/70 | HR 59 | Ht 68.0 in | Wt 186.0 lb

## 2022-08-03 DIAGNOSIS — I2510 Atherosclerotic heart disease of native coronary artery without angina pectoris: Secondary | ICD-10-CM

## 2022-08-03 DIAGNOSIS — I6523 Occlusion and stenosis of bilateral carotid arteries: Secondary | ICD-10-CM

## 2022-08-03 DIAGNOSIS — I10 Essential (primary) hypertension: Secondary | ICD-10-CM

## 2022-08-03 DIAGNOSIS — E782 Mixed hyperlipidemia: Secondary | ICD-10-CM | POA: Diagnosis not present

## 2022-08-03 DIAGNOSIS — R600 Localized edema: Secondary | ICD-10-CM

## 2022-08-03 MED ORDER — FUROSEMIDE 20 MG PO TABS: ORAL_TABLET | ORAL | 3 refills | Status: DC

## 2022-08-03 MED ORDER — POTASSIUM CHLORIDE CRYS ER 20 MEQ PO TBCR: 60.0000 meq | EXTENDED_RELEASE_TABLET | Freq: Every day | ORAL | 3 refills | Status: DC

## 2022-08-03 NOTE — Patient Instructions (Signed)
Medication Instructions:  Your physician has recommended you make the following change in your medication:  - Start Lasix 20 mg tablets daily. May take an additional 20 mg tablet as needed for swelling, fluid.    Labwork: BMET MAG Fasting Lipid Panel  Testing/Procedures: None  Follow-Up: Follow up with Dr. Harl Bowie in 6 months.   Any Other Special Instructions Will Be Listed Below (If Applicable).     If you need a refill on your cardiac medications before your next appointment, please call your pharmacy.

## 2022-08-03 NOTE — Progress Notes (Signed)
Clinical Summary Monica Stafford is a 82 y.o.female seen today for follow up of the following medical problems.       1. CAD - history of CABG in 2002 (LIMA-LAD,RIMA-RCA,SVG-OM1,SVG-D2). LVEF 69% by LVgram in 2002  - 03/2017 admit with chest pain. Troponins negative. Nuclear stress without ischemia, low risk - 03/2017 echo LVEF 123456, grade I diastolic dysfunction - no beta blocker due to bradycardia       02/2022:   Successful two-vessel PCI of the mid RCA and the SVG to OM.  - no recent chest pains     2. Carotid stenosis - history of left CEA 12/2000 -01/2019 carotid US RICA 123456, [id number] placque LICA with patent CEA - followed by vascualr    123456 RICA 123456, LICA patent CEA  0000000 1-39% bilateral disease   3. Hyperlipidemia -due for repeat labs   4.HTN -she is compliant with meds - edema on norvasc 10, has tolerated 5   5. Afib   - no recent palpitations.  - no bleeding on eliquis   6. LE edema - started after norvasc was increased to 30m daily back in 10/2015 - norvasc was decreased to 587mdaily which improved swelling - mild chronic edema. Previously a fairly benign echo, suspect likely component of venous insufficiency    -chronic LE edema, slightly increased    7. Syncope - presented 03/2017 with chest pain and syncope - syncope episode occurred after becoming nauseated - labs suggested she was dehydrated. She had also taken a nitroglycerin while standing around that time.    - no recent issues      8.Preoperative evaluation - plans for lumbar epidural infjection -she is on plavix with stent 02/2022, on eliquis for afib      SH:  Works at [employer]  Son [id number]   Past Medical History:  Diagnosis Date   Arthritis    back  all over   CAD (coronary artery disease)    a. s/p CABG in 2002 with LIMA-LAD, RIMA-RCA, SVG-OM1, SVG-D2 b. low-risk NST in 03/2017 c. cath in 02/2022 with DES to mid-RCA and DES to SVG-OM    Hyperlipidemia    Hypertension    PAF (paroxysmal atrial fibrillation) (HCC)    Peripheral vascular disease ([facility]   Pneumonia     50 years  ago   Sleep apnea      study but dont wear   mask     Allergies  Allergen Reactions   Sulfonamide Derivatives Itching     Current Outpatient Medications  Medication Sig Dispense Refill   acetaminophen (TYLENOL) 500 MG tablet Take 1,000 mg by mouth 3 (three) times daily as needed for mild pain or moderate pain.     alendronate (FOSAMAX) 70 MG tablet Take 70 mg by mouth every Monday.     ALPRAZolam (XANAX) 1 MG tablet Take 0.25-0.5 mg by mouth 3 (three) times daily as needed for anxiety.     amLODipine (NORVASC) 5 MG tablet Take 1 tablet by mouth once daily 90 tablet 1   apixaban (ELIQUIS) 5 MG TABS tablet Take 1 tablet by mouth twice daily 180 tablet 1   atorvastatin (LIPITOR) 40 MG tablet Take 40 mg by mouth every evening.     Calcium Carbonate-Vitamin D 600-400 MG-UNIT tablet Take 1-2 tablets by mouth daily. Take 1 tablet in the morning & take 2 tablets in the evening.     clopidogrel (PLAVIX) 75 MG  tablet Take 1 tablet (75 mg total) by mouth daily with breakfast. 90 tablet 1   estradiol (ESTRACE) 0.1 MG/GM vaginal cream Place 1 g vaginally every other day as needed (irritation.).     fish oil-omega-3 fatty acids 1000 MG capsule Take 1 g by mouth every evening.     furosemide (LASIX) 20 MG tablet Take 1 tablet by mouth daily. May take an additional tablet as needed for swelling, weight gain. 90 tablet 3   lisinopril (ZESTRIL) 10 MG tablet Take 10 mg by mouth 2 (two) times daily.     metoprolol tartrate (LOPRESSOR) 25 MG tablet Take 1/2 (one-half) tablet by mouth twice daily 90 tablet 1   Multiple Vitamin (MULTIVITAMIN WITH MINERALS) TABS Take 1 tablet by mouth in the morning.     nitroGLYCERIN (NITROSTAT) 0.4 MG SL tablet Place 1 tablet (0.4 mg total) under the tongue every 5 (five) minutes as needed for chest pain. 25 tablet 1   Polyethyl  Glycol-Propyl Glycol (SYSTANE) 0.4-0.3 % SOLN Place 1 drop into both eyes 4 (four) times daily as needed (dry/irritated eyes.).     raloxifene (EVISTA) 60 MG tablet Take 60 mg by mouth every other day. In the morning     potassium chloride SA (KLOR-CON M) 20 MEQ tablet Take 3 tablets (60 mEq total) by mouth daily. 270 tablet 3   No current facility-administered medications for this visit.     Past Surgical History:  Procedure Laterality Date   ABDOMINAL HYSTERECTOMY  1998   [facility]   CARDIAC CATHETERIZATION     CATARACT EXTRACTION W/PHACO  12/10/2011   Procedure: CATARACT EXTRACTION PHACO AND INTRAOCULAR LENS PLACEMENT ([place]);  Surgeon: Tonny Lorayne Getchell, MD;  Location: [facility];  Service: Ophthalmology;  Laterality: Right;  CDE:10.40   CATARACT EXTRACTION W/PHACO  01/07/2012   Procedure: CATARACT EXTRACTION PHACO AND INTRAOCULAR LENS PLACEMENT ([facility]);  Surgeon: Tonny Saxon Crosby, MD;  Location: [facility];  Service: Ophthalmology;  Laterality: Left;  CDE 8.38   CORONARY ARTERY BYPASS GRAFT  12/23/2000   [facility]   CORONARY STENT INTERVENTION N/A 03/15/2022   Procedure: CORONARY STENT INTERVENTION;  Surgeon: Jettie Booze, MD;  Location: [facility];  Service: Cardiovascular;  Laterality: N/A;   LAPAROSCOPIC APPENDECTOMY N/A 02/27/2016   Procedure: APPENDECTOMY LAPAROSCOPIC;  Surgeon: Aviva Signs, MD;  Location: [facility];  Service: General;  Laterality: N/A;   LEFT HEART CATH AND CORS/GRAFTS ANGIOGRAPHY N/A 03/15/2022   Procedure: LEFT HEART CATH AND CORS/GRAFTS ANGIOGRAPHY;  Surgeon: Jettie Booze, MD;  Location: [facility];  Service: Cardiovascular;  Laterality: N/A;     Allergies  Allergen Reactions   Sulfonamide Derivatives Itching      Family History  Problem Relation Age of Onset   Heart disease Father        before age 61   Heart attack Father    Parkinson's disease Sister    Colitis Sister      Social History Monica Stafford reports that she  has never smoked. She has never been exposed to tobacco smoke. She has never used smokeless tobacco. Monica Stafford reports no history of alcohol use.   Review of Systems CONSTITUTIONAL: No weight loss, fever, chills, weakness or fatigue.  HEENT: Eyes: No visual loss, blurred vision, double vision or yellow sclerae.No hearing loss, sneezing, congestion, runny nose or sore throat.  SKIN: No rash or itching.  CARDIOVASCULAR: per hpi RESPIRATORY: No shortness of breath, cough or sputum.  GASTROINTESTINAL: No anorexia,  nausea, vomiting or diarrhea. No abdominal pain or blood.  GENITOURINARY: No burning on urination, no polyuria NEUROLOGICAL: No headache, dizziness, syncope, paralysis, ataxia, numbness or tingling in the extremities. No change in bowel or bladder control.  MUSCULOSKELETAL: No muscle, back pain, joint pain or stiffness.  LYMPHATICS: No enlarged nodes. No history of splenectomy.  PSYCHIATRIC: No history of depression or anxiety.  ENDOCRINOLOGIC: No reports of sweating, cold or heat intolerance. No polyuria or polydipsia.  Marland Kitchen   Physical Examination Today's Vitals   08/03/22 1555 08/03/22 1630  BP: (!) 140/70 128/70  Pulse: (!) 59   SpO2: 93%   Weight: 186 lb (84.4 kg)   Height: 5' 8"$  (1.727 m)    Body mass index is 28.28 kg/m.  Gen: resting comfortably, no acute distress HEENT: no scleral icterus, pupils equal round and reactive, no palptable cervical adenopathy,  CV: irreg, no m/r/g, no jvd Resp: Clear to auscultation bilaterally GI: abdomen is soft, non-tender, non-distended, normal bowel sounds, no hepatosplenomegaly MSK: extremities are warm, no edema.  Skin: warm, no rash Neuro:  no focal deficits Psych: appropriate affect   Diagnostic Studies  12/2000 Cath PROCEDURES PERFORMED: 1. Left heart catheterization. 2. Selective coronary arteriography. 3. Selective left ventriculography. 4. Subclavian angiography.   DESCRIPTION OF PROCEDURE:  The procedure was  performed from the right femoral artery using 6-French catheters.  She tolerated the procedure well without complications.  She did have elevated blood pressures.  She was given two doses of labetalol 20 mg IV, plus an additional dose of 5 mg of intravenous metoprolol.  There were no complications from the procedure and she was taken to the holding area in satisfactory clinical condition.   HEMODYNAMIC DATA:  Central aortic pressure was 190/83.  LV pressure was 190/20.  There was no gradient on pullback across the aortic valve.   ANGIOGRAPHIC DATA: 1. Left ventriculography was performed in the RAO projection.  Overall    ventricular systolic function was normal.  No segmental abnormalities    or contraction were identified.  There did not appear to be significant    mitral regurgitation.  Ejection fraction was calculated at 69%. 2. Subclavian angiography revealed a patent subclavian artery, as well as    an internal mammary artery.  The internal mammary appeared to be suitable    for grafting. 3. The left main coronary artery was free of critical disease. 4. The left anterior descending artery coursed to the apex where it wrapped    the apical tip.  The LAD had about a 50% area of narrowing beyond the    origin of the first diagonal.  The first diagonal itself had about 50%    narrowing.  Distal to this, there was a segmental area of plaquing leading    into an approximate 90% stenosis just after the large anterolateral Shepherd Finnan.    This anterolateral Mong Neal itself was a large caliber vessel that had about    80% proximal narrowing.  Both vessels appeared to be suitable for grafting. 5. The circumflex artery had mild plaquing of approximately 20% to 30% prior    to the first marginal.  The first marginal itself had about 50% ostial and    then a mid 60% stenosis.  This vessel also appeared to be suitable for    grafting. 6. The right coronary artery was a large dominant vessel providing a  moderate    sized posterior descending and a very large posterolateral Elese Rane.  The  right coronary artery had a 20% to 30% narrowing in the proximal vessel.    In the mid vessel was a focal 90% stenosis.   CONCLUSIONS: 1. Preserved left ventricular function. 2. Patent internal mammary artery. 3. 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 4. Moderate stenosis of the circumflex artery. 5. 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   Cath report  2002 recs:  The patient has three-vessel disease.  The right is suitable for intervention, but the LAD is a complex lesion with a high risk of target vessel revascularization characterized by a long lesion and small caliber vessel.  Based on these risk factors and the multivessel disease, we likely will recommend revascularization surgery.  I will notify her primary care physician and we will get a surgical consultation.DD:  12/20/00 TD:  12/20/00 Job: [id number]      03/2017 Nuclear stress IMPRESSION: 1. Tiny fixed perfusion defect at the apex.   2. Normal left ventricular wall motion.   3. Left ventricular ejection fraction 64%   4. Non invasive risk stratification*: Low risk.   03/2017 echo Study Conclusions   - Left ventricle: The cavity size was normal. Wall thickness was   increased in a pattern of mild LVH. Systolic function was normal.   The estimated ejection fraction was in the range of 60% to 65%.   Chordal SAM noted. Wall motion was normal; there were no regional   wall motion abnormalities. Doppler parameters are consistent with   abnormal left ventricular relaxation (grade 1 diastolic   dysfunction). - Aortic valve: There was no stenosis. - Mitral valve: Mildly calcified annulus. - Right ventricle: The cavity size was normal. Systolic function   was normal. - Tricuspid valve: Peak RV-RA gradient (S): 20 mm Hg. - Pulmonary arteries: PA  peak pressure: 23 mm Hg (S). - Inferior vena cava: The vessel was normal in size. The   respirophasic diameter changes were in the normal range (>= 50%),   consistent with normal central venous pressure.   Impressions:   - Normal LV size with mild LV hypertrophy. EF 60-65%. Normal RV   size and systolic function. No significant valvular   abnormalities.       Assessment and Plan  1. CAD - no symptoms. On plavix with eliquis for stent 02/2022 with afib - continue current meds   2. Carotid stenosis - continue to follow with vacular    3. Hyperlipidemia - repeat lipid panel  4. HTN - bp is at goal, continue current meds   5. Afib/acquired thrombophilia - no symptoms, continue current meds   6. LE edema - likely secondary to venous insufficiecny - try increasing lasix to 4m daily, take additinoal 227mprn  7. Preoperative evaluation - needs lumbar epidural - eliquis ok to hold 3 days prior, resuem day after - little early to hold plavix, preferably would wait until at least 6 months out from stent which would be end of march. At that time could hold 5 days prior, resume day after.        JoArnoldo LenisM.D.

## 2022-08-07 ENCOUNTER — Other Ambulatory Visit (HOSPITAL_COMMUNITY)
Admission: RE | Admit: 2022-08-07 | Discharge: 2022-08-07 | Disposition: A | Discharge status: Home / Self Care | Payer: Medicare HMO | Source: Ambulatory Visit | Attending: Cardiology | Admitting: Cardiology

## 2022-08-07 ENCOUNTER — Ambulatory Visit (HOSPITAL_COMMUNITY)
Admission: RE | Admit: 2022-08-07 | Discharge: 2022-08-07 | Disposition: A | Discharge status: Home / Self Care | Payer: Medicare HMO | Source: Ambulatory Visit | Attending: Family Medicine | Admitting: Family Medicine

## 2022-08-07 ENCOUNTER — Other Ambulatory Visit (HOSPITAL_COMMUNITY): Payer: Self-pay | Admitting: Family Medicine

## 2022-08-07 DIAGNOSIS — R0602 Shortness of breath: Secondary | ICD-10-CM

## 2022-08-07 DIAGNOSIS — I2510 Atherosclerotic heart disease of native coronary artery without angina pectoris: Secondary | ICD-10-CM | POA: Insufficient documentation

## 2022-08-07 DIAGNOSIS — R609 Edema, unspecified: Secondary | ICD-10-CM

## 2022-08-07 LAB — BASIC METABOLIC PANEL
Anion gap: 9 (ref 5–15)
BUN: 15 mg/dL (ref 8–23)
CO2: 27 mmol/L (ref 22–32)
Calcium: 8.5 mg/dL — ABNORMAL LOW (ref 8.9–10.3)
Chloride: 101 mmol/L (ref 98–111)
Creatinine, Ser: 0.87 mg/dL (ref 0.44–1.00)
GFR, Estimated: >60 mL/min (ref >60)
Glucose, Bld: 110 mg/dL — ABNORMAL HIGH (ref 70–99)
Potassium: 3.5 mmol/L (ref 3.5–5.1)
Sodium: 137 mmol/L (ref 135–145)

## 2022-08-07 LAB — LIPID PANEL
Cholesterol: 102 mg/dL (ref 0–200)
HDL: 38 mg/dL — ABNORMAL LOW (ref >40)
LDL Cholesterol: 49 mg/dL (ref 0–99)
Total CHOL/HDL Ratio: 2.7 RATIO
Triglycerides: 74 mg/dL (ref <150)
VLDL: 15 mg/dL (ref 0–40)

## 2022-08-07 LAB — MAGNESIUM: Magnesium: 1.8 mg/dL (ref 1.7–2.4)

## 2022-08-31 ENCOUNTER — Other Ambulatory Visit (HOSPITAL_COMMUNITY): Payer: Self-pay | Admitting: Nurse Practitioner

## 2022-08-31 DIAGNOSIS — R0602 Shortness of breath: Secondary | ICD-10-CM

## 2022-09-04 ENCOUNTER — Other Ambulatory Visit: Payer: Self-pay | Admitting: Cardiology

## 2022-09-06 ENCOUNTER — Ambulatory Visit (HOSPITAL_COMMUNITY)
Admission: RE | Admit: 2022-09-06 | Discharge: 2022-09-06 | Disposition: A | Discharge status: Home / Self Care | Payer: Medicare HMO | Source: Ambulatory Visit | Attending: Family Medicine | Admitting: Family Medicine

## 2022-09-06 DIAGNOSIS — Z951 Presence of aortocoronary bypass graft: Secondary | ICD-10-CM | POA: Insufficient documentation

## 2022-09-06 DIAGNOSIS — I119 Hypertensive heart disease without heart failure: Secondary | ICD-10-CM | POA: Insufficient documentation

## 2022-09-06 DIAGNOSIS — I2510 Atherosclerotic heart disease of native coronary artery without angina pectoris: Secondary | ICD-10-CM | POA: Diagnosis not present

## 2022-09-06 DIAGNOSIS — I4891 Unspecified atrial fibrillation: Secondary | ICD-10-CM | POA: Insufficient documentation

## 2022-09-06 DIAGNOSIS — R600 Localized edema: Secondary | ICD-10-CM | POA: Diagnosis not present

## 2022-09-06 DIAGNOSIS — E785 Hyperlipidemia, unspecified: Secondary | ICD-10-CM | POA: Insufficient documentation

## 2022-09-06 DIAGNOSIS — R609 Edema, unspecified: Secondary | ICD-10-CM

## 2022-09-06 DIAGNOSIS — I252 Old myocardial infarction: Secondary | ICD-10-CM | POA: Insufficient documentation

## 2022-09-06 DIAGNOSIS — I083 Combined rheumatic disorders of mitral, aortic and tricuspid valves: Secondary | ICD-10-CM | POA: Diagnosis not present

## 2022-09-06 LAB — ECHOCARDIOGRAM COMPLETE
AR max vel: 2.22 cm2
AV Area VTI: 2.29 cm2
AV Area mean vel: 2.37 cm2
AV Mean grad: 3.7 mmHg
AV Peak grad: 7.1 mmHg
Ao pk vel: 1.33 m/s
Area-P 1/2: 6.71 cm2
MV M vel: 3.54 m/s
MV Peak grad: 50.1 mmHg
S' Lateral: 2.95 cm

## 2022-09-20 ENCOUNTER — Other Ambulatory Visit: Payer: Self-pay | Admitting: Cardiology

## 2022-09-24 NOTE — Telephone Encounter (Signed)
Patient is calling to check on status of this prescription due to only having enough medication for today. Advised patient we are waiting on confirmation from Dr. Wyline Mood that he wants her to continue medication before sending refill to the pharmacy. Patient verbalized understanding.

## 2022-09-26 ENCOUNTER — Telehealth: Payer: Self-pay | Admitting: Cardiology

## 2022-09-26 MED ORDER — METOPROLOL TARTRATE 25 MG PO TABS: ORAL_TABLET | ORAL | 2 refills | Status: DC

## 2022-09-26 NOTE — Telephone Encounter (Signed)
Completed.

## 2022-09-26 NOTE — Telephone Encounter (Signed)
*  STAT* If patient is at the pharmacy, call can be transferred to refill team.   1. Which medications need to be refilled? (please list name of each medication and dose if known) metoprolol tartrate (LOPRESSOR) 25 MG tablet   2. Which pharmacy/location (including street and city if local pharmacy) is medication to be sent to? [id number] - [place], [place] - [address]   3. Do they need a 30 day or 90 day supply? 90

## 2022-10-01 ENCOUNTER — Other Ambulatory Visit: Payer: Self-pay | Admitting: Cardiology

## 2022-10-03 ENCOUNTER — Ambulatory Visit: Payer: Medicare HMO | Admitting: Nurse Practitioner

## 2022-10-09 ENCOUNTER — Other Ambulatory Visit: Payer: Self-pay | Admitting: Cardiology

## 2022-10-11 ENCOUNTER — Encounter: Payer: Self-pay | Admitting: Nurse Practitioner

## 2022-10-11 ENCOUNTER — Ambulatory Visit: Payer: Medicare HMO | Attending: Nurse Practitioner | Admitting: Nurse Practitioner

## 2022-10-11 VITALS — BP 142/86 | HR 54 | Ht 68.0 in | Wt 177.2 lb

## 2022-10-11 DIAGNOSIS — I2510 Atherosclerotic heart disease of native coronary artery without angina pectoris: Secondary | ICD-10-CM | POA: Diagnosis not present

## 2022-10-11 DIAGNOSIS — I4819 Other persistent atrial fibrillation: Secondary | ICD-10-CM

## 2022-10-11 DIAGNOSIS — I10 Essential (primary) hypertension: Secondary | ICD-10-CM

## 2022-10-11 DIAGNOSIS — I25110 Atherosclerotic heart disease of native coronary artery with unstable angina pectoris: Secondary | ICD-10-CM

## 2022-10-11 DIAGNOSIS — I272 Pulmonary hypertension, unspecified: Secondary | ICD-10-CM

## 2022-10-11 DIAGNOSIS — E785 Hyperlipidemia, unspecified: Secondary | ICD-10-CM | POA: Diagnosis not present

## 2022-10-11 DIAGNOSIS — I6523 Occlusion and stenosis of bilateral carotid arteries: Secondary | ICD-10-CM | POA: Diagnosis not present

## 2022-10-11 DIAGNOSIS — J449 Chronic obstructive pulmonary disease, unspecified: Secondary | ICD-10-CM

## 2022-10-11 MED ORDER — FUROSEMIDE 20 MG PO TABS: 40.0000 mg | ORAL_TABLET | Freq: Every day | ORAL | 1 refills | Status: DC

## 2022-10-11 MED ORDER — CLOPIDOGREL BISULFATE 75 MG PO TABS: 75.0000 mg | ORAL_TABLET | Freq: Every day | ORAL | 1 refills | Status: DC

## 2022-10-11 NOTE — Progress Notes (Signed)
Office Visit    Patient Name: Monica Stafford Date of Encounter: 10/11/2022  PCP:  Gareth Morgan, MD   [facility]  Cardiologist:  Dina Rich, MD  Advanced Practice Provider:  No care team member to display Electrophysiologist:  None   Chief Complaint    Monica Stafford is a 82 y.o. female with a hx of CAD, s/p CABG, carotid artery stenosis, s/p CEA in 2002, hypertension, hyperlipidemia, A-fib, history of syncope, and leg edema, who presents today for Echo follow-up.   Past Medical History    Past Medical History:  Diagnosis Date   Arthritis    back  all over   CAD (coronary artery disease)    a. s/p CABG in 2002 with LIMA-LAD, RIMA-RCA, SVG-OM1, SVG-D2 b. low-risk NST in 03/2017 c. cath in 02/2022 with DES to mid-RCA and DES to SVG-OM   Hyperlipidemia    Hypertension    PAF (paroxysmal atrial fibrillation) (HCC)    Peripheral vascular disease (HCC)    Pneumonia     50 years  ago   Sleep apnea      study but dont wear   mask   Past Surgical History:  Procedure Laterality Date   ABDOMINAL HYSTERECTOMY  1998   [facility]   CARDIAC CATHETERIZATION     CATARACT EXTRACTION W/PHACO  12/10/2011   Procedure: CATARACT EXTRACTION PHACO AND INTRAOCULAR LENS PLACEMENT (IOC);  Surgeon: Gemma Payor, MD;  Location: [facility];  Service: Ophthalmology;  Laterality: Right;  CDE:10.40   CATARACT EXTRACTION W/PHACO  01/07/2012   Procedure: CATARACT EXTRACTION PHACO AND INTRAOCULAR LENS PLACEMENT (IOC);  Surgeon: Gemma Payor, MD;  Location: [facility];  Service: Ophthalmology;  Laterality: Left;  CDE 8.38   CORONARY ARTERY BYPASS GRAFT  12/23/2000   [facility]   CORONARY STENT INTERVENTION N/A 03/15/2022   Procedure: CORONARY STENT INTERVENTION;  Surgeon: Corky Crafts, MD;  Location: [facility] INVASIVE CV LAB;  Service: Cardiovascular;  Laterality: N/A;   LAPAROSCOPIC APPENDECTOMY N/A 02/27/2016   Procedure: APPENDECTOMY LAPAROSCOPIC;  Surgeon:  Franky Macho, MD;  Location: [facility];  Service: General;  Laterality: N/A;   LEFT HEART CATH AND CORS/GRAFTS ANGIOGRAPHY N/A 03/15/2022   Procedure: LEFT HEART CATH AND CORS/GRAFTS ANGIOGRAPHY;  Surgeon: Corky Crafts, MD;  Location: [facility] INVASIVE CV LAB;  Service: Cardiovascular;  Laterality: N/A;    Allergies  Allergies  Allergen Reactions   Sulfonamide Derivatives Itching    History of Present Illness    Monica Stafford is a 82 y.o. female with a PMH as mentioned above.  History of CABG in 2002 (LIMA-LAD, RIMA-RCA, SVG-D2, SVG-OM1). Hx of Left CEA in 2022. NST in 2018 negative for ischemia, low risk.  Echo overall unremarkable at that time.  Has not been on beta-blocker due to bradycardia.  Hx of syncope 03/2017 after becoming nauseated, labs suggesting dehydration, took NTG while standing around that time. In September 2023 she underwent successful two-vessel PCI of mid RCA and SVG to OM.   Last seen by Dr. Dina Rich on August 03, 2022. Was pending lumbar epidural around that time, but suggested to wait until at least 6 months out from stent.   Today she is here for follow-up based on recent Echo arranged by PCP. EF normal, no RWMA. Report noted below. Doing well. Denies any chest pain, shortness of breath, palpitations, syncope, presyncope, dizziness, orthopnea, PND, swelling or significant weight changes, acute bleeding, or claudication.  EKGs/Labs/Other Studies Reviewed:  The following studies were reviewed today:   EKG:  EKG is not ordered today.     Echo 08/2022: 1. Left ventricular ejection fraction, by estimation, is 55 to 60%. The  left ventricle has normal function. The left ventricle has no regional  wall motion abnormalities. There is moderate left ventricular hypertrophy.  Left ventricular diastolic  parameters are indeterminate.   2. Right ventricular systolic function is normal. The right ventricular  size is normal. There is moderately elevated  pulmonary artery systolic  pressure.   3. Left atrial size was severely dilated.   4. Right atrial size was severely dilated.   5. The mitral valve is abnormal. Mild mitral valve regurgitation. No  evidence of mitral stenosis.   6. The tricuspid valve is abnormal. Tricuspid valve regurgitation is  moderate.   7. The aortic valve is tricuspid. There is mild calcification of the  aortic valve. There is mild thickening of the aortic valve. Aortic valve  regurgitation is mild.   8. The inferior vena cava is dilated in size with <50% respiratory  variability, suggesting right atrial pressure of 15 mmHg.  LHC 02/2022:   Mid RCA lesion is 90% stenosed.A drug-eluting stent was successfully placed using a SYNERGY XD 3.0X16, postdilated to 3.5 mm.   Post intervention, there is a 0% residual stenosis.   Based on no competitive flow in the distal RCA, it was concluded that the RIMA graft to RCA is occluded.   2nd Diag lesion is 75% stenosed.  SVG to diagonal is patent.   1st Mrg lesion is 100% stenosed.   Origin to Prox Graft lesion is 90% stenosed of the SVG to OM 2.  A drug-eluting stent was successfully placed using a SYNERGY XD 2.25X16, later to high pressure   Post intervention, there is a 0% residual stenosis.   Mid LAD lesion is 100% stenosed.  LIMA could not be selectively engaged.  There did appear to be washout of contrast in the mid to distal LAD on the image of the SVG to diagonal.  Since the native LAD is occluded, it seems this must be coming from a LIMA graft.   .   The left ventricular systolic function is normal.   LV end diastolic pressure is mildly elevated.   The left ventricular ejection fraction is 55-65% by visual estimate.   There is no aortic valve stenosis.   Successful two-vessel PCI of the mid RCA and the SVG to OM.  LVEDP mildly elevated.  She did receive a fair amount of contrast for this procedure.  We will hydrate overnight.  She may need a dose of Lasix tomorrow prior  to discharge.  Myoview 02/2022:   Findings are consistent with prior small basal to mid inferior myocardial infarction with moderate peri-infarct ischemia. Prior small apical infarct with moderate peri-infarct ischemia.  The study is intermediate risk.   No ST deviation was noted.   LV perfusion is abnormal. There is small moderate intensity basal to mid inferior defect that is moderately reversible. There is small moderate intensity apical defect with moderate reversibility.   Left ventricular function is normal. End diastolic cavity size is normal.  Carotid duplex 08/2021: Summary:  Right Carotid: Velocities in the right ICA are consistent with a 1-39% stenosis.   Left Carotid: Velocities in the left ICA are consistent with a 1-39% stenosis. CEA patent.   Vertebrals:  Bilateral vertebral arteries demonstrate antegrade flow.  Subclavians: Normal flow hemodynamics were seen in bilateral subclavian arteries.  Recent Labs: 03/01/2022: ALT 30; B Natriuretic Peptide 229.0; TSH 1.250 03/16/2022: Hemoglobin 12.9; Platelets 123 08/07/2022: BUN 15; Creatinine, Ser 0.87; Magnesium 1.8; Potassium 3.5; Sodium 137  Recent Lipid Panel    Component Value Date/Time   CHOL 102 08/07/2022 0932   TRIG 74 08/07/2022 0932   HDL 38 (L) 08/07/2022 0932   CHOLHDL 2.7 08/07/2022 0932   VLDL 15 08/07/2022 0932   LDLCALC 49 08/07/2022 0932    Risk Assessment/Calculations:   CHA2DS2-VASc Score = 5  This indicates a 7.2% annual risk of stroke. The patient's score is based upon: CHF History: 0 HTN History: 1 Diabetes History: 0 Stroke History: 0 Vascular Disease History: 1 Age Score: 2 Gender Score: 1   Home Medications   Current Meds  Medication Sig   acetaminophen (TYLENOL) 500 MG tablet Take 1,000 mg by mouth 3 (three) times daily as needed for mild pain or moderate pain.   alendronate (FOSAMAX) 70 MG tablet Take 70 mg by mouth every Monday.   ALPRAZolam (XANAX) 1 MG tablet Take 0.25-0.5 mg by  mouth 3 (three) times daily as needed for anxiety.   amLODipine (NORVASC) 5 MG tablet Take 1 tablet by mouth once daily   apixaban (ELIQUIS) 5 MG TABS tablet Take 1 tablet by mouth twice daily   atorvastatin (LIPITOR) 40 MG tablet Take 40 mg by mouth every evening.   Calcium Carbonate-Vitamin D 600-400 MG-UNIT tablet Take 1-2 tablets by mouth daily. Take 1 tablet in the morning & take 2 tablets in the evening.   estradiol (ESTRACE) 0.1 MG/GM vaginal cream Place 1 g vaginally every other day as needed (irritation.).   fish oil-omega-3 fatty acids 1000 MG capsule Take 1 g by mouth every evening.   lisinopril (ZESTRIL) 10 MG tablet Take 10 mg by mouth 2 (two) times daily.   metoprolol tartrate (LOPRESSOR) 25 MG tablet Take 1/2 (one-half) tablet by mouth twice daily.   Multiple Vitamin (MULTIVITAMIN WITH MINERALS) TABS Take 1 tablet by mouth in the morning.   nitroGLYCERIN (NITROSTAT) 0.4 MG SL tablet Place 1 tablet (0.4 mg total) under the tongue every 5 (five) minutes as needed for chest pain.   Polyethyl Glycol-Propyl Glycol (SYSTANE) 0.4-0.3 % SOLN Place 1 drop into both eyes 4 (four) times daily as needed (dry/irritated eyes.).   potassium chloride SA (KLOR-CON M) 20 MEQ tablet Take 3 tablets (60 mEq total) by mouth daily.   raloxifene (EVISTA) 60 MG tablet Take 60 mg by mouth every other day. In the morning    clopidogrel (PLAVIX) 75 MG tablet Take 1 tablet by mouth once daily with breakfast    furosemide (LASIX) 20 MG tablet Take 1 tablet by mouth daily. May take an additional tablet as needed for swelling, weight gain. (Patient taking differently: Take 40 mg by mouth daily.)     Review of Systems    All other systems reviewed and are otherwise negative except as noted above.  Physical Exam    VS:  BP (!) 142/86 (BP Location: Right Arm, Patient Position: Sitting, Cuff Size: Normal)   Pulse (!) 54   Ht 5\' 8"  (1.727 m)   Wt 177 lb 3.2 oz (80.4 kg)   SpO2 96%   BMI 26.94 kg/m  , BMI  Body mass index is 26.94 kg/m.  Wt Readings from Last 3 Encounters:  10/11/22 177 lb 3.2 oz (80.4 kg)  08/03/22 186 lb (84.4 kg)  03/30/22 175 lb 9.6 oz (79.7 kg)     GEN: Well  nourished, well developed, in no acute distress. HEENT: normal. Neck: Supple, no JVD, carotid bruits, or masses. Cardiac: S1/S2, irregular rhythm and regular rate, no murmurs, rubs, or gallops. No clubbing, cyanosis, edema.  Radials/PT 2+ and equal bilaterally.  Respiratory:  Respirations regular and unlabored, clear to auscultation bilaterally. MS: No deformity or atrophy. Skin: Warm and dry, no rash. Neuro:  Strength and sensation are intact. Psych: Normal affect.  Assessment & Plan    CAD, s/p CABG Stable with no anginal symptoms. No indication for ischemic evaluation. Continue atorvastatin, lisinopril, Lopressor, and NTG PRN. Heart healthy diet and regular cardiovascular exercise encouraged.   Carotid artery stenosis, s/p CEA in 2002 Denies any issues. Has followed [facility], but is requesting carotid doppler to be performed locally. Will arrange. Continue current meds. Heart healthy diet and regular cardiovascular exercise encouraged.   Hypertension BP on arrival 142/86, repeat BP 137/79. Discussed to monitor BP at home at least 2 hours after medications and sitting for 5-10 minutes. No medication changes at this time. Heart healthy diet and regular cardiovascular exercise encouraged.   Hyperlipidemia LDL 49 07/2022. Currently at goal. Continue current medications. Heart healthy diet and regular cardiovascular exercise encouraged.   A-fib Denies any tachycardia or palpitations. Continue current medications. Currently on appropriate dosage of Eliquis. No bleeding issues. Heart healthy diet and regular cardiovascular exercise encouraged.   COPD, pulmonary HTN 2 view CXR 07/2022 revealed COPD and Echo revealed moderately elevated PASP most likely d/t COPD. Denies any recent shortness of breath or acute  exacerbations. Continue to follow with PCP. Will arrange referral to Pulmonology.    Disposition: Will provide refills per her request. Follow up with Dina Rich, MD as scheduled.  Signed, Sharlene Dory, NP 10/13/2022, 11:00 PM [facility]

## 2022-10-11 NOTE — Patient Instructions (Addendum)
Medication Instructions:  Your physician recommends that you continue on your current medications as directed. Please refer to the Current Medication list given to you today.  Medications refilled today.  Labwork: none  Testing/Procedures: Your physician has requested that you have a carotid duplex. This test is an ultrasound of the carotid arteries in your neck. It looks at blood flow through these arteries that supply the brain with blood. Allow one hour for this exam. There are no restrictions or special instructions.   Follow-Up:  Your physician recommends that you schedule a follow-up appointment in: Thursday, February 28, 2023 @ 11:404 am with Dr. Wyline Mood at the [place] office.  Any Other Special Instructions Will Be Listed Below (If Applicable).  You have been referred to Pulmonology   If you need a refill on your cardiac medications before your next appointment, please call your pharmacy.

## 2022-10-15 ENCOUNTER — Other Ambulatory Visit (HOSPITAL_COMMUNITY): Payer: Self-pay | Admitting: Family Medicine

## 2022-10-15 ENCOUNTER — Telehealth: Payer: Self-pay

## 2022-10-15 DIAGNOSIS — Z1231 Encounter for screening mammogram for malignant neoplasm of breast: Secondary | ICD-10-CM

## 2022-10-15 MED ORDER — FUROSEMIDE 20 MG PO TABS: 40.0000 mg | ORAL_TABLET | Freq: Every day | ORAL | 1 refills | Status: DC

## 2022-10-15 NOTE — Telephone Encounter (Signed)
The patient called and said that [facility] wouldn't refill her furosemide and that she has ran out because she is now taking 2 per day instead of 1. The patient was told that the refill was sent to the pharmacy.

## 2022-10-15 NOTE — Addendum Note (Signed)
Addended by: Delsa Sale on: 10/15/2022 02:31 PM   Modules accepted: Orders

## 2022-10-17 ENCOUNTER — Encounter: Payer: Self-pay | Admitting: Pulmonary Disease

## 2022-10-17 ENCOUNTER — Ambulatory Visit: Payer: Medicare HMO | Admitting: Pulmonary Disease

## 2022-10-17 VITALS — BP 152/80 | HR 56 | Ht 68.0 in | Wt 180.8 lb

## 2022-10-17 DIAGNOSIS — I10 Essential (primary) hypertension: Secondary | ICD-10-CM

## 2022-10-17 DIAGNOSIS — I2729 Other secondary pulmonary hypertension: Secondary | ICD-10-CM | POA: Insufficient documentation

## 2022-10-17 DIAGNOSIS — G4733 Obstructive sleep apnea (adult) (pediatric): Secondary | ICD-10-CM | POA: Diagnosis not present

## 2022-10-17 DIAGNOSIS — R0602 Shortness of breath: Secondary | ICD-10-CM | POA: Diagnosis not present

## 2022-10-17 NOTE — Patient Instructions (Addendum)
Right heart pressure is a little high  X PFTs next available  X Home sleep test  Get back on lasix  Lisinopril could be causing your cough

## 2022-10-17 NOTE — Progress Notes (Signed)
Subjective:    Patient ID: Monica Stafford, female    DOB: November 25, 1940, 82 y.o.   MRN: [id number]  HPI Chief Complaint  Patient presents with   Pulmonary Consult    Referred by Sharlene Dory, NP for eval of COPD. She c/o SOB when she bends over or walks too fast. She has had a cough for about a year- prod with yellow to clear sputum.     82 year old never smoker referred by cardiology for evaluation of COPD and shortness of breath.   PMH :  CAD, s/p CABG,  carotid artery stenosis, s/p CEA in 2002,  hypertension, hyperlipidemia,  A-fib, history of syncope   She is maintained on lisinopril and reports cough which she reports is throat clearing, ongoing for about a year.  Occasionally productive of clear sputum. She reports decreased energy and her legs feel heavy.  She has developed increased edema.  She is maintained on Lasix 20 mg, chest x-ray 08/07/22 showed mild hyperinflation with left more than right effusion, she was asked to increase her Lasix to 40 mg daily and she has run out of her pills.  Pedal edema has come back. Chest x-ray was reported as COPD and hence she was referred.  Epworth sleepiness score is 8 Bedtime is 11 PM, sleep latency about 30 minutes, she sleeps on her side on her back with 1 pillow, reports several nocturnal awakenings due to nocturia and is out of bed by 6 AM feeling rested.  She naps for 1 to 2 hours during the daytime.  She is still active and works at [employer]  part-time There is no history suggestive of cataplexy, sleep paralysis or parasomnias   Significant tests/ events reviewed  Echo 08/2022 normal LV function, dilated RA, normal RV function, RVSP 45 mm 07/2008 NPSG - mod OSA, AHI 15, mostly REM related ,low sat 79% , BMI 27   Past Medical History:  Diagnosis Date   Arthritis    back  all over   CAD (coronary artery disease)    a. s/p CABG in 2002 with LIMA-LAD, RIMA-RCA, SVG-OM1, SVG-D2 b. low-risk NST in 03/2017 c. cath in 02/2022 with  DES to mid-RCA and DES to SVG-OM   Hyperlipidemia    Hypertension    PAF (paroxysmal atrial fibrillation) (HCC)    Peripheral vascular disease (HCC)    Pneumonia     50 years  ago   Sleep apnea      study but dont wear   mask    Past Surgical History:  Procedure Laterality Date   ABDOMINAL HYSTERECTOMY  1998   [facility]   CARDIAC CATHETERIZATION     CATARACT EXTRACTION W/PHACO  12/10/2011   Procedure: CATARACT EXTRACTION PHACO AND INTRAOCULAR LENS PLACEMENT (IOC);  Surgeon: Gemma Payor, MD;  Location: [facility];  Service: Ophthalmology;  Laterality: Right;  CDE:10.40   CATARACT EXTRACTION W/PHACO  01/07/2012   Procedure: CATARACT EXTRACTION PHACO AND INTRAOCULAR LENS PLACEMENT (IOC);  Surgeon: Gemma Payor, MD;  Location: [facility];  Service: Ophthalmology;  Laterality: Left;  CDE 8.38   CORONARY ARTERY BYPASS GRAFT  12/23/2000   [facility]   CORONARY STENT INTERVENTION N/A 03/15/2022   Procedure: CORONARY STENT INTERVENTION;  Surgeon: Corky Crafts, MD;  Location: [facility] INVASIVE CV LAB;  Service: Cardiovascular;  Laterality: N/A;   LAPAROSCOPIC APPENDECTOMY N/A 02/27/2016   Procedure: APPENDECTOMY LAPAROSCOPIC;  Surgeon: Franky Macho, MD;  Location: [facility];  Service: General;  Laterality: N/A;   LEFT  HEART CATH AND CORS/GRAFTS ANGIOGRAPHY N/A 03/15/2022   Procedure: LEFT HEART CATH AND CORS/GRAFTS ANGIOGRAPHY;  Surgeon: Corky Crafts, MD;  Location: [facility] INVASIVE CV LAB;  Service: Cardiovascular;  Laterality: N/A;    Allergies  Allergen Reactions   Sulfonamide Derivatives Itching    Social History   Socioeconomic History   Marital status: Divorced    Spouse name: Not on file   Number of children: Not on file   Years of education: Not on file   Highest education level: Not on file  Occupational History   Not on file  Tobacco Use   Smoking status: Never    Passive exposure: Past   Smokeless tobacco: Never  Vaping Use   Vaping Use: Never used  Substance  and Sexual Activity   Alcohol use: No    Alcohol/week: 0.0 standard drinks of alcohol   Drug use: No   Sexual activity: Not Currently  Other Topics Concern   Not on file  Social History Narrative   [employer] x 32 years   Social Determinants of Health   Financial Resource Strain: Not on file  Food Insecurity: No Food Insecurity (03/16/2022)   Hunger Vital Sign    Worried About Running Out of Food in the Last Year: Never true    Ran Out of Food in the Last Year: Never true  Transportation Needs: No Transportation Needs (03/16/2022)   PRAPARE - Administrator, Civil Service (Medical): No    Lack of Transportation (Non-Medical): No  Physical Activity: Not on file  Stress: Not on file  Social Connections: Not on file  Intimate Partner Violence: Not At Risk (03/16/2022)   Humiliation, Afraid, Rape, and Kick questionnaire    Fear of Current or Ex-Partner: No    Emotionally Abused: No    Physically Abused: No    Sexually Abused: No    Family History  Problem Relation Age of Onset   Heart disease Father        before age 38   Heart attack Father    Parkinson's disease Sister    Colitis Sister    COPD Maternal Uncle        smoked     Review of Systems Shortness of breath Pedal edema   Constitutional: negative for anorexia, fevers and sweats  Eyes: negative for irritation, redness and visual disturbance  Ears, nose, mouth, throat, and face: negative for earaches, epistaxis, nasal congestion and sore throat  Cardiovascular: negative for chest pain, orthopnea, palpitations and syncope  Gastrointestinal: negative for abdominal pain, constipation, diarrhea, melena, nausea and vomiting  Genitourinary:negative for dysuria, frequency and hematuria  Hematologic/lymphatic: negative for bleeding, easy bruising and lymphadenopathy  Musculoskeletal:negative for arthralgias, muscle weakness and stiff joints  Neurological: negative for coordination problems, gait  problems, headaches and weakness  Endocrine: negative for diabetic symptoms including polydipsia, polyuria and weight loss     Objective:   Physical Exam  Gen. Pleasant, obese, in no distress, normal affect ENT - no pallor,icterus, no post nasal drip, class 2 airway Neck: No JVD, no thyromegaly, no carotid bruits Lungs: no use of accessory muscles, no dullness to percussion, decreased without rales or rhonchi  Cardiovascular: Rhythm regular, heart sounds  normal, no murmurs or gallops, 2+ peripheral edema Abdomen: soft and non-tender, no hepatosplenomegaly, BS normal. Musculoskeletal: No deformities, no cyanosis or clubbing Neuro:  alert, non focal, no tremors        Assessment & Plan:

## 2022-10-17 NOTE — Assessment & Plan Note (Signed)
Cough is likely related to lisinopril. Suggest change to ARB but will defer to PCP or cardiology

## 2022-10-17 NOTE — Assessment & Plan Note (Signed)
We will reassess with a home sleep test. Previously this was noted to be mild degree especially REM related. She would be willing to use a CPAP if needed

## 2022-10-17 NOTE — Assessment & Plan Note (Signed)
Her shortness of breath is related to diastolic heart failure and pulm hypertension.  Doubt that she has significant COPD since she is a never smoker.  Chest x-ray showed mild hyperinflation. Will obtain PFTs to clarify.  -Lasix has been increased to 40 mg, she has just run out of her pills and repeat prescription has been sent. CT chest has been scheduled by PCP but clearly the important issue here is pleural effusions which is related to fluid overload.  -Pulmonary hypertension is likely secondary to left heart issues and perhaps due to OSA

## 2022-10-25 ENCOUNTER — Ambulatory Visit (HOSPITAL_COMMUNITY)
Admission: RE | Admit: 2022-10-25 | Discharge: 2022-10-25 | Disposition: A | Discharge status: Home / Self Care | Payer: Medicare HMO | Source: Ambulatory Visit | Attending: Nurse Practitioner | Admitting: Nurse Practitioner

## 2022-10-25 DIAGNOSIS — R0602 Shortness of breath: Secondary | ICD-10-CM | POA: Diagnosis present

## 2022-10-25 MED ORDER — IOHEXOL 300 MG/ML  SOLN
75.0000 mL | Freq: Once | INTRAMUSCULAR | Status: AC | PRN
  Administered 2022-10-25: 75 mL via INTRAVENOUS

## 2022-10-30 ENCOUNTER — Ambulatory Visit (HOSPITAL_COMMUNITY)
Admission: RE | Admit: 2022-10-30 | Discharge: 2022-10-30 | Disposition: A | Discharge status: Home / Self Care | Payer: Medicare HMO | Source: Ambulatory Visit | Attending: Nurse Practitioner | Admitting: Nurse Practitioner

## 2022-10-30 DIAGNOSIS — I2510 Atherosclerotic heart disease of native coronary artery without angina pectoris: Secondary | ICD-10-CM | POA: Diagnosis present

## 2022-10-31 ENCOUNTER — Ambulatory Visit (HOSPITAL_COMMUNITY)
Admission: RE | Admit: 2022-10-31 | Discharge: 2022-10-31 | Disposition: A | Discharge status: Home / Self Care | Payer: Medicare HMO | Source: Ambulatory Visit | Attending: Family Medicine | Admitting: Family Medicine

## 2022-10-31 ENCOUNTER — Encounter (HOSPITAL_COMMUNITY): Payer: Self-pay

## 2022-10-31 DIAGNOSIS — Z1231 Encounter for screening mammogram for malignant neoplasm of breast: Secondary | ICD-10-CM | POA: Diagnosis present

## 2022-11-19 ENCOUNTER — Other Ambulatory Visit: Payer: Self-pay | Admitting: Cardiology

## 2022-11-19 ENCOUNTER — Telehealth: Payer: Self-pay | Admitting: Pulmonary Disease

## 2022-11-19 ENCOUNTER — Encounter (INDEPENDENT_AMBULATORY_CARE_PROVIDER_SITE_OTHER): Payer: Medicare HMO

## 2022-11-19 DIAGNOSIS — G4733 Obstructive sleep apnea (adult) (pediatric): Secondary | ICD-10-CM | POA: Diagnosis not present

## 2022-11-19 NOTE — Telephone Encounter (Signed)
HST showed moderate OSA with AHI 26/ hr & low sat of 75% Suggest autoCPAP  5-15 cm, mask of choice OV with me/[facility] in 6 -8 wks after starting

## 2022-11-19 NOTE — Telephone Encounter (Signed)
Prescription refill request for Eliquis received. Indication: PAF Last office visit: 10/11/22  Shawnie Dapper NP Scr: 0.87 on 08/07/22  Shawnie Dapper MD Age: 82 Weight: 80.4kg  Based on above findings Eliquis 5mg  twice daily is the appropriate dose.  Refill approved.

## 2022-11-26 NOTE — Telephone Encounter (Signed)
ATC pt number 2'x - both times someone answered then hung up before I would speak. Will try again later.

## 2022-12-07 ENCOUNTER — Other Ambulatory Visit: Payer: Self-pay

## 2022-12-07 ENCOUNTER — Emergency Department (HOSPITAL_COMMUNITY): Payer: Worker's Compensation

## 2022-12-07 ENCOUNTER — Encounter (HOSPITAL_COMMUNITY): Payer: Self-pay | Admitting: Emergency Medicine

## 2022-12-07 ENCOUNTER — Emergency Department (HOSPITAL_COMMUNITY)
Admission: EM | Admit: 2022-12-07 | Discharge: 2022-12-07 | Disposition: A | Discharge status: Home / Self Care | Payer: Worker's Compensation | Attending: Emergency Medicine | Admitting: Emergency Medicine

## 2022-12-07 DIAGNOSIS — W010XXA Fall on same level from slipping, tripping and stumbling without subsequent striking against object, initial encounter: Secondary | ICD-10-CM | POA: Diagnosis not present

## 2022-12-07 DIAGNOSIS — Z7901 Long term (current) use of anticoagulants: Secondary | ICD-10-CM | POA: Insufficient documentation

## 2022-12-07 DIAGNOSIS — I10 Essential (primary) hypertension: Secondary | ICD-10-CM | POA: Insufficient documentation

## 2022-12-07 DIAGNOSIS — S3289XA Fracture of other parts of pelvis, initial encounter for closed fracture: Secondary | ICD-10-CM | POA: Insufficient documentation

## 2022-12-07 DIAGNOSIS — Z79899 Other long term (current) drug therapy: Secondary | ICD-10-CM | POA: Insufficient documentation

## 2022-12-07 DIAGNOSIS — I2510 Atherosclerotic heart disease of native coronary artery without angina pectoris: Secondary | ICD-10-CM | POA: Insufficient documentation

## 2022-12-07 DIAGNOSIS — Z951 Presence of aortocoronary bypass graft: Secondary | ICD-10-CM | POA: Insufficient documentation

## 2022-12-07 DIAGNOSIS — Z7902 Long term (current) use of antithrombotics/antiplatelets: Secondary | ICD-10-CM | POA: Diagnosis not present

## 2022-12-07 DIAGNOSIS — Y990 Civilian activity done for income or pay: Secondary | ICD-10-CM | POA: Diagnosis not present

## 2022-12-07 DIAGNOSIS — S329XXA Fracture of unspecified parts of lumbosacral spine and pelvis, initial encounter for closed fracture: Secondary | ICD-10-CM

## 2022-12-07 DIAGNOSIS — S79912A Unspecified injury of left hip, initial encounter: Secondary | ICD-10-CM | POA: Diagnosis present

## 2022-12-07 LAB — CBC
HCT: 39.8 % (ref 36.0–46.0)
Hemoglobin: 13.5 g/dL (ref 12.0–15.0)
MCH: 35.2 pg — ABNORMAL HIGH (ref 26.0–34.0)
MCHC: 33.9 g/dL (ref 30.0–36.0)
MCV: 103.9 fL — ABNORMAL HIGH (ref 80.0–100.0)
Platelets: 127 10*3/uL — ABNORMAL LOW (ref 150–400)
RBC: 3.83 MIL/uL — ABNORMAL LOW (ref 3.87–5.11)
RDW: 13.7 % (ref 11.5–15.5)
WBC: 6.6 10*3/uL (ref 4.0–10.5)
nRBC: 0 % (ref 0.0–0.2)

## 2022-12-07 LAB — BASIC METABOLIC PANEL
Anion gap: 7 (ref 5–15)
BUN: 19 mg/dL (ref 8–23)
CO2: 29 mmol/L (ref 22–32)
Calcium: 8.6 mg/dL — ABNORMAL LOW (ref 8.9–10.3)
Chloride: 102 mmol/L (ref 98–111)
Creatinine, Ser: 0.75 mg/dL (ref 0.44–1.00)
GFR, Estimated: >60 mL/min (ref >60)
Glucose, Bld: 109 mg/dL — ABNORMAL HIGH (ref 70–99)
Potassium: 3.2 mmol/L — ABNORMAL LOW (ref 3.5–5.1)
Sodium: 138 mmol/L (ref 135–145)

## 2022-12-07 LAB — TYPE AND SCREEN
ABO/RH(D): O POS
Antibody Screen: NEGATIVE

## 2022-12-07 LAB — PROTIME-INR
INR: 1.3 — ABNORMAL HIGH (ref 0.8–1.2)
Prothrombin Time: 16.8 seconds — ABNORMAL HIGH (ref 11.4–15.2)

## 2022-12-07 MED ORDER — OXYCODONE-ACETAMINOPHEN 5-325 MG PO TABS
1.0000 | ORAL_TABLET | Freq: Once | ORAL | Status: AC
  Administered 2022-12-07: 1 via ORAL
  Filled 2022-12-07: qty 1

## 2022-12-07 MED ORDER — OXYCODONE HCL 5 MG PO TABS: 5.0000 mg | ORAL_TABLET | Freq: Four times a day (QID) | ORAL | 0 refills | Status: AC | PRN

## 2022-12-07 MED ORDER — POTASSIUM CHLORIDE 20 MEQ PO PACK
40.0000 meq | PACK | Freq: Once | ORAL | Status: AC
  Administered 2022-12-07: 40 meq via ORAL
  Filled 2022-12-07: qty 2

## 2022-12-07 NOTE — ED Provider Notes (Signed)
[place] EMERGENCY DEPARTMENT AT [facility] Provider Note  CSN: [id number] Arrival date & time: 12/07/22 1738  Chief Complaint(s) Fall and Hip Pain  HPI Monica Stafford is a 82 y.o. female with history of coronary artery disease, hypertension, hyperlipidemia, paroxysmal A-fib on anticoagulation presenting to the emergency department after fall.  Patient reports 2 days ago had a trip and fall at her work, landing on the left hip.  She reports that she has been able to ambulate, has been using a walker,, bearing weight but painful.  She reports that she can actually go up stairs without pain.  No numbness or tingling.  No other injuries to her right leg, arms, chest or abdomen.  She adamantly denies hitting her head or neck.  She has no back pain.  She has had no headaches, nausea or vomiting since the incident.   Past Medical History Past Medical History:  Diagnosis Date   Arthritis    back  all over   CAD (coronary artery disease)    a. s/p CABG in 2002 with LIMA-LAD, RIMA-RCA, SVG-OM1, SVG-D2 b. low-risk NST in 03/2017 c. cath in 02/2022 with DES to mid-RCA and DES to SVG-OM   Hyperlipidemia    Hypertension    PAF (paroxysmal atrial fibrillation) (HCC)    Peripheral vascular disease (HCC)    Pneumonia     50 years  ago   Sleep apnea      study but dont wear   mask   Patient Active Problem List   Diagnosis Date Noted   Other secondary pulmonary hypertension (HCC) 10/17/2022   CAD (coronary artery disease) 03/16/2022   Abnormal stress test    NSTEMI (non-ST elevated myocardial infarction) (HCC) 03/20/2017   Chest pain    Vasovagal syncope    Unstable angina (HCC) 03/19/2017   Acute appendicitis 02/27/2016   Hx of CABG x 4 2002 01/17/2016   Essential hypertension 01/17/2016   Dyslipidemia 01/17/2016   Edema of both legs 01/17/2016   Carotid stenosis, asymptomatic 04/12/2015   OBSTRUCTIVE SLEEP APNEA 09/10/2008   Home Medication(s) Prior to Admission  medications   Medication Sig Start Date End Date Taking? Authorizing Provider  oxyCODONE (ROXICODONE) 5 MG immediate release tablet Take 1 tablet (5 mg total) by mouth every 6 (six) hours as needed for severe pain. 12/07/22  Yes Lonell Grandchild, MD  acetaminophen (TYLENOL) 500 MG tablet Take 1,000 mg by mouth 3 (three) times daily as needed for mild pain or moderate pain.    [provider]  alendronate (FOSAMAX) 70 MG tablet Take 70 mg by mouth every Monday. 06/27/21   [provider]  ALPRAZolam Prudy Feeler) 1 MG tablet Take 0.25-0.5 mg by mouth 3 (three) times daily as needed for anxiety. 10/18/16   [provider]  amLODipine (NORVASC) 5 MG tablet Take 1 tablet by mouth once daily 09/04/22   Antoine Poche, MD  apixaban Everlene Balls) 5 MG TABS tablet Take 1 tablet by mouth twice daily 11/19/22   Antoine Poche, MD  atorvastatin (LIPITOR) 40 MG tablet Take 40 mg by mouth every evening. 08/06/19   [provider]  Calcium Carbonate-Vitamin D 600-400 MG-UNIT tablet Take 1-2 tablets by mouth daily. Take 1 tablet in the morning & take 2 tablets in the evening.    [provider]  clopidogrel (PLAVIX) 75 MG tablet Take 1 tablet (75 mg total) by mouth daily with breakfast. 10/11/22   Sharlene Dory, NP  estradiol (ESTRACE) 0.1 MG/GM  vaginal cream Place 1 g vaginally every other day as needed (irritation.).    [provider]  fish oil-omega-3 fatty acids 1000 MG capsule Take 1 g by mouth every evening.    [provider]  furosemide (LASIX) 20 MG tablet Take 2 tablets (40 mg total) by mouth daily. May take an additional tablet as needed for swelling, weight gain. 10/15/22   Antoine Poche, MD  lisinopril (ZESTRIL) 10 MG tablet Take 10 mg by mouth 2 (two) times daily. 11/21/20   [provider]  metoprolol tartrate (LOPRESSOR) 25 MG tablet Take 1/2 (one-half) tablet by mouth twice daily. 09/26/22   Antoine Poche, MD  Multiple Vitamin  (MULTIVITAMIN WITH MINERALS) TABS Take 1 tablet by mouth in the morning.    [provider]  nitroGLYCERIN (NITROSTAT) 0.4 MG SL tablet Place 1 tablet (0.4 mg total) under the tongue every 5 (five) minutes as needed for chest pain. 03/20/17   [facility], Roe Rutherford, PA  Polyethyl Glycol-Propyl Glycol (SYSTANE) 0.4-0.3 % SOLN Place 1 drop into both eyes 4 (four) times daily as needed (dry/irritated eyes.).    [provider]  potassium chloride SA (KLOR-CON M) 20 MEQ tablet Take 3 tablets (60 mEq total) by mouth daily. 08/03/22   Antoine Poche, MD  raloxifene (EVISTA) 60 MG tablet Take 60 mg by mouth every other day. In the morning    [provider]                                                                                                                                    Past Surgical History Past Surgical History:  Procedure Laterality Date   ABDOMINAL HYSTERECTOMY  1998   [facility]   CARDIAC CATHETERIZATION     CATARACT EXTRACTION W/PHACO  12/10/2011   Procedure: CATARACT EXTRACTION PHACO AND INTRAOCULAR LENS PLACEMENT (IOC);  Surgeon: Gemma Payor, MD;  Location: [facility];  Service: Ophthalmology;  Laterality: Right;  CDE:10.40   CATARACT EXTRACTION W/PHACO  01/07/2012   Procedure: CATARACT EXTRACTION PHACO AND INTRAOCULAR LENS PLACEMENT (IOC);  Surgeon: Gemma Payor, MD;  Location: [facility];  Service: Ophthalmology;  Laterality: Left;  CDE 8.38   CORONARY ARTERY BYPASS GRAFT  12/23/2000   [facility]   CORONARY STENT INTERVENTION N/A 03/15/2022   Procedure: CORONARY STENT INTERVENTION;  Surgeon: Corky Crafts, MD;  Location: [facility] INVASIVE CV LAB;  Service: Cardiovascular;  Laterality: N/A;   LAPAROSCOPIC APPENDECTOMY N/A 02/27/2016   Procedure: APPENDECTOMY LAPAROSCOPIC;  Surgeon: Franky Macho, MD;  Location: [facility];  Service: General;  Laterality: N/A;   LEFT HEART CATH AND CORS/GRAFTS ANGIOGRAPHY N/A 03/15/2022   Procedure: LEFT HEART CATH AND  CORS/GRAFTS ANGIOGRAPHY;  Surgeon: Corky Crafts, MD;  Location: [facility] INVASIVE CV LAB;  Service: Cardiovascular;  Laterality: N/A;   Family History Family History  Problem Relation Age of Onset   Heart disease Father  before age 13   Heart attack Father    Parkinson's disease Sister    Colitis Sister    COPD Maternal Uncle        smoked    Social History Social History   Tobacco Use   Smoking status: Never    Passive exposure: Past   Smokeless tobacco: Never  Vaping Use   Vaping Use: Never used  Substance Use Topics   Alcohol use: No    Alcohol/week: 0.0 standard drinks of alcohol   Drug use: No   Allergies Sulfonamide derivatives  Review of Systems Review of Systems  All other systems reviewed and are negative.   Physical Exam Vital Signs  I have reviewed the triage vital signs BP (!) 169/71   Pulse 74   Temp 98.2 F (36.8 C) (Oral)   Resp 18   Ht 5\' 8"  (1.727 m)   Wt 80.7 kg   SpO2 95%   BMI 27.06 kg/m  Physical Exam Vitals and nursing note reviewed.  Constitutional:      General: She is not in acute distress.    Appearance: She is well-developed.  HENT:     Head: Normocephalic and atraumatic.     Mouth/Throat:     Mouth: Mucous membranes are moist.  Eyes:     Pupils: Pupils are equal, round, and reactive to light.  Cardiovascular:     Rate and Rhythm: Normal rate and regular rhythm.     Pulses:          Dorsalis pedis pulses are 2+ on the right side and 2+ on the left side.     Heart sounds: No murmur heard. Pulmonary:     Effort: Pulmonary effort is normal. No respiratory distress.     Breath sounds: Normal breath sounds.  Abdominal:     General: Abdomen is flat.     Palpations: Abdomen is soft.     Tenderness: There is no abdominal tenderness.  Musculoskeletal:        General: No tenderness.     Right lower leg: No edema.     Left lower leg: No edema.     Comments: No midline C, T, L-spine tenderness, full active range of  motion of bilateral upper and lower extremities with no focal tenderness or deformity.  Very mild tenderness over the left hip region but patient able to range her left hip with no pain and there is no deformity, shortening or rotation  Skin:    General: Skin is warm and dry.  Neurological:     General: No focal deficit present.     Mental Status: She is alert. Mental status is at baseline.  Psychiatric:        Mood and Affect: Mood normal.        Behavior: Behavior normal.     ED Results and Treatments Labs (all labs ordered are listed, but only abnormal results are displayed) Labs Reviewed  CBC - Abnormal; Notable for the following components:      Result Value   RBC 3.83 (*)    MCV 103.9 (*)    MCH 35.2 (*)    Platelets 127 (*)    All other components within normal limits  PROTIME-INR - Abnormal; Notable for the following components:   Prothrombin Time 16.8 (*)    INR 1.3 (*)    All other components within normal limits  BASIC METABOLIC PANEL - Abnormal; Notable for the following components:   Potassium 3.2 (*)  Glucose, Bld 109 (*)    Calcium 8.6 (*)    All other components within normal limits  TYPE AND SCREEN                                                                                                                          Radiology DG Hip Unilat With Pelvis 2-3 Views Left  Result Date: 12/07/2022 CLINICAL DATA:  fall EXAM: DG HIP (WITH OR WITHOUT PELVIS) 2-3V LEFT COMPARISON:  None Available. FINDINGS: Cortical irregularity and lucent lines along the left superior pubic rami and pubic symphyses on the left. There is no evidence of hip fracture or dislocation of the left hip. No pelvic diastasis . Severe degenerative changes of the right hip. At least mild degenerative changes left hip. Vascular calcification. IMPRESSION: Comminuted minimally displaced left superior pubic rami and pubic symphysis fracture. Fracture likely extends to the inferior pubic rami. Limited  evaluation due to overlapping osseous structures and overlying soft tissues. Electronically Signed   By: Tish Frederickson M.D.   On: 12/07/2022 19:17    Pertinent labs & imaging results that were available during my care of the patient were reviewed by me and considered in my medical decision making (see MDM for details).  Medications Ordered in ED Medications  oxyCODONE-acetaminophen (PERCOCET/ROXICET) 5-325 MG per tablet 1 tablet (has no administration in time range)  potassium chloride (KLOR-CON) packet 40 mEq (has no administration in time range)                                                                                                                                     Procedures Procedures  (including critical care time)  Medical Decision Making / ED Course   MDM:  82 year old female presenting to the emergency department head pain after fall.  Patient overall very well-appearing, adamantly denies any head injury, no evidence of injury on exam other than very minimal tenderness over the left hip.  She has normal range of motion and no pain with full range of motion.  She has been weightbearing with pain.  X-ray of the pelvis and hip shows no hip fracture, does show multiple pelvic fractures, no open book pelvis or unstable fracture.  Discussed with on-call orthopedist Dr. Aundria Rud who reviewed the x-rays and reports that patient should be stable for weightbearing as tolerated.  Recommended physical therapy evaluation in the emergency department if  patient desires.  Discussed with patient, she reports that she has been ambulatory with a walker, does not want to stay in the hospital even overnight and feels comfortable following up with her primary doctor for referral physical therapy.  Very low concern for occult hip fracture or other occult traumatic injury.  Discussed pain control to patient, will prescribe small amount of oxycodone.  Discussed that this could make her drowsy and  be very careful to avoid further falls.  She has prescription for Xanax that I discussed with her she should not take with oxycodone.  Also recommended not taking with alcohol or driving.  Will discharge patient to home. All questions answered. Patient comfortable with plan of discharge. Return precautions discussed with patient and specified on the after visit summary.       Additional history obtained: -Additional history obtained from spouse   Lab Tests: -I ordered, reviewed, and interpreted labs.   The pertinent results include:   Labs Reviewed  CBC - Abnormal; Notable for the following components:      Result Value   RBC 3.83 (*)    MCV 103.9 (*)    MCH 35.2 (*)    Platelets 127 (*)    All other components within normal limits  PROTIME-INR - Abnormal; Notable for the following components:   Prothrombin Time 16.8 (*)    INR 1.3 (*)    All other components within normal limits  BASIC METABOLIC PANEL - Abnormal; Notable for the following components:   Potassium 3.2 (*)    Glucose, Bld 109 (*)    Calcium 8.6 (*)    All other components within normal limits  TYPE AND SCREEN    Notable for mild hypokalemia   Imaging Studies ordered: I ordered imaging studies including XR hip/pelvis On my interpretation imaging demonstrates no hip fx, multiple pelvic fx w/o open book pelvis I independently visualized and interpreted imaging. I agree with the radiologist interpretation   Medicines ordered and prescription drug management: Meds ordered this encounter  Medications   oxyCODONE (ROXICODONE) 5 MG immediate release tablet    Sig: Take 1 tablet (5 mg total) by mouth every 6 (six) hours as needed for severe pain.    Dispense:  15 tablet    Refill:  0   oxyCODONE-acetaminophen (PERCOCET/ROXICET) 5-325 MG per tablet 1 tablet   potassium chloride (KLOR-CON) packet 40 mEq    -I have reviewed the patients home medicines and have made adjustments as needed   Consultations  Obtained: I requested consultation with the orthopedic surgeon Dr. Aundria Rud,  and discussed lab and imaging findings as well as pertinent plan - they recommend: WBAT, stable for discharge, PT consult which patient refused   Reevaluation: After the interventions noted above, I reevaluated the patient and found that their symptoms have improved  Co morbidities that complicate the patient evaluation  Past Medical History:  Diagnosis Date   Arthritis    back  all over   CAD (coronary artery disease)    a. s/p CABG in 2002 with LIMA-LAD, RIMA-RCA, SVG-OM1, SVG-D2 b. low-risk NST in 03/2017 c. cath in 02/2022 with DES to mid-RCA and DES to SVG-OM   Hyperlipidemia    Hypertension    PAF (paroxysmal atrial fibrillation) (HCC)    Peripheral vascular disease (HCC)    Pneumonia     50 years  ago   Sleep apnea      study but dont wear   mask      Dispostion: Disposition  decision including need for hospitalization was considered, and patient discharged from emergency department.    Final Clinical Impression(s) / ED Diagnoses Final diagnoses:  Closed displaced fracture of pelvis, unspecified part of pelvis, initial encounter [facility])     This chart was dictated using voice recognition software.  Despite best efforts to proofread,  errors can occur which can change the documentation meaning.    Lonell Grandchild, MD 12/07/22 2048

## 2022-12-07 NOTE — Discharge Instructions (Addendum)
We evaluated you for your hip pain and fall.  Your x-ray shows a pelvic fracture, fracture of the bones that help support your hip.  We discussed your injuries with the orthopedic surgeon on-call, Dr. Aundria Rud, and he stated that it is okay to walk and put weight on your hip as you are able.  We discussed staying overnight in the hospital for physical therapy but you preferred to go home.  Please follow-up with your primary doctor for referral for physical therapy.  Please take at 1000 mg of Tylenol every 6 hours as needed for pain.  I have also prescribed you oxycodone.  If your pain is unrelieved by Tylenol you can take oxycodone.  Please be extremely careful when taking oxycodone as it can make you at a high risk of fall.  Absolutely do not mix this with your home Xanax.  We do not want you to have any further falls.  Please return to the emergency department if you have any worsening pain, inability to walk, falls, numbness or tingling, fevers or chills, or any other concerning symptoms.

## 2022-12-07 NOTE — ED Triage Notes (Signed)
Pt via POV after mechanical fall on Wednesday night; she tripped over a bench at work. Pt reports left hip pain and inability to tolerate weight bearing exercises. Pain currently rated 8/10 with ambulation. Pt takes blood thinners. Denies hitting head, no LOC.

## 2022-12-07 NOTE — ED Notes (Signed)
Pt independent transferred from bed to bsc in the room. Pt needed no assistance with clothing

## 2023-01-04 ENCOUNTER — Encounter: Payer: Self-pay | Admitting: Pulmonary Disease

## 2023-01-04 ENCOUNTER — Ambulatory Visit (INDEPENDENT_AMBULATORY_CARE_PROVIDER_SITE_OTHER): Payer: Medicare HMO | Admitting: Pulmonary Disease

## 2023-01-04 VITALS — BP 146/71 | HR 64 | Ht 68.0 in | Wt 175.0 lb

## 2023-01-04 DIAGNOSIS — I2729 Other secondary pulmonary hypertension: Secondary | ICD-10-CM

## 2023-01-04 DIAGNOSIS — G4733 Obstructive sleep apnea (adult) (pediatric): Secondary | ICD-10-CM | POA: Diagnosis not present

## 2023-01-04 NOTE — Patient Instructions (Signed)
X Rx for autoCPAP 5-12 cm to DME Try to use at least 4-6 hrs every night

## 2023-01-04 NOTE — Assessment & Plan Note (Signed)
SHe has moderate OSA & is symptomatic  Rx for autoCPAP 5-12 cm to DME Try to use at least 4-6 hrs every night  Weight loss encouraged, compliance with goal of at least 4-6 hrs every night is the expectation. Advised against medications with sedative side effects Cautioned against driving when sleepy - understanding that sleepiness will vary on a day to day basis

## 2023-01-04 NOTE — Progress Notes (Signed)
   Subjective:    Patient ID: Monica Stafford, female    DOB: Apr 15, 1941, 81 y.o.   MRN: [id number]  HPI  82 yo never smoker referred by cardiology for dyspnea   PMH :  CAD, s/p CABG,  carotid artery stenosis, s/p CEA in 2002,  hypertension, hyperlipidemia,  A-fib, history of syncope  Initial OV 10/2022 > Her shortness of breath is related to diastolic heart failure and pulm hypertension. Doubt that she has significant COPD since she is a never smoker.  She is  active and works at [employer] part-time   2 month FU viist She is back on lasix, sob is better, still has pedal edema She had a fall 4 wks ago & pelvic fracture, lot of pain, ambulating with walker, recovering Cough has resolved, remains on lisinopril PFts scheduled for next week   Significant tests/ events reviewed  HST 11/2022 mod AHI 26/h, low sat 75%   Echo 08/2022 normal LV function, dilated RA, normal RV function, RVSP 45 mm 07/2008 NPSG - mod OSA, AHI 15, mostly REM related ,low sat 79% , BMI 27   Review of Systems neg for any significant sore throat, dysphagia, itching, sneezing, nasal congestion or excess/ purulent secretions, fever, chills, sweats, unintended wt loss, pleuritic or exertional cp, hempoptysis, orthopnea pnd or change in chronic leg swelling. Also denies presyncope, palpitations, heartburn, abdominal pain, nausea, vomiting, diarrhea or change in bowel or urinary habits, dysuria,hematuria, rash, arthralgias, visual complaints, headache, numbness weakness or ataxia.     Objective:   Physical Exam  Gen. Pleasant, obese, in no distress ENT - no lesions, no post nasal drip Neck: No JVD, no thyromegaly, no carotid bruits Lungs: no use of accessory muscles, no dullness to percussion, decreased without rales or rhonchi  Cardiovascular: Rhythm regular, heart sounds  normal, no murmurs or gallops, 1+ peripheral edema Musculoskeletal: No deformities, no cyanosis or clubbing , no tremors          Assessment & Plan:

## 2023-01-04 NOTE — Assessment & Plan Note (Signed)
Obtain PFTs but likely related to cardiac cause in this never smoker

## 2023-01-11 ENCOUNTER — Ambulatory Visit (HOSPITAL_COMMUNITY)
Admission: RE | Admit: 2023-01-11 | Discharge: 2023-01-11 | Disposition: A | Discharge status: Home / Self Care | Payer: Medicare HMO | Source: Ambulatory Visit | Attending: Pulmonary Disease | Admitting: Pulmonary Disease

## 2023-01-11 DIAGNOSIS — R0602 Shortness of breath: Secondary | ICD-10-CM | POA: Diagnosis present

## 2023-01-11 LAB — PULMONARY FUNCTION TEST
DL/VA % pred: 92 %
DL/VA: 3.7 ml/min/mmHg/L
DLCO unc % pred: 78 %
DLCO unc: 16.34 ml/min/mmHg
FEF 25-75 Post: 1.55 L/sec
FEF 25-75 Pre: 1.19 L/sec
FEF2575-%Change-Post: 30 %
FEF2575-%Pred-Post: 104 %
FEF2575-%Pred-Pre: 79 %
FEV1-%Change-Post: 8 %
FEV1-%Pred-Post: 93 %
FEV1-%Pred-Pre: 86 %
FEV1-Post: 2.09 L
FEV1-Pre: 1.93 L
FEV1FVC-%Change-Post: 4 %
FEV1FVC-%Pred-Pre: 95 %
FEV6-%Change-Post: 1 %
FEV6-%Pred-Post: 99 %
FEV6-%Pred-Pre: 97 %
FEV6-Post: 2.82 L
FEV6-Pre: 2.77 L
FEV6FVC-%Change-Post: -1 %
FEV6FVC-%Pred-Post: 104 %
FEV6FVC-%Pred-Pre: 105 %
FVC-%Change-Post: 3 %
FVC-%Pred-Post: 95 %
FVC-%Pred-Pre: 92 %
FVC-Post: 2.86 L
FVC-Pre: 2.77 L
Post FEV1/FVC ratio: 73 %
Post FEV6/FVC ratio: 99 %
Pre FEV1/FVC ratio: 70 %
Pre FEV6/FVC Ratio: 100 %
RV % pred: 96 %
RV: 2.55 L
TLC % pred: 95 %
TLC: 5.4 L

## 2023-01-11 MED ORDER — ALBUTEROL SULFATE (2.5 MG/3ML) 0.083% IN NEBU
2.5000 mg | INHALATION_SOLUTION | Freq: Once | RESPIRATORY_TRACT | Status: AC
  Administered 2023-01-11: 2.5 mg via RESPIRATORY_TRACT

## 2023-01-14 ENCOUNTER — Other Ambulatory Visit: Payer: Self-pay | Admitting: Orthopaedic Surgery

## 2023-01-14 DIAGNOSIS — S32592A Other specified fracture of left pubis, initial encounter for closed fracture: Secondary | ICD-10-CM

## 2023-01-15 ENCOUNTER — Telehealth: Payer: Self-pay | Admitting: Pulmonary Disease

## 2023-01-15 NOTE — Telephone Encounter (Signed)
Patient states [employer] does not cover her insurance for CPAP machine. Patient phone number is [phone].

## 2023-01-16 ENCOUNTER — Ambulatory Visit
Admission: RE | Admit: 2023-01-16 | Discharge: 2023-01-16 | Disposition: A | Discharge status: Home / Self Care | Payer: Worker's Compensation | Source: Ambulatory Visit | Attending: Orthopaedic Surgery | Admitting: Orthopaedic Surgery

## 2023-01-16 DIAGNOSIS — S32592A Other specified fracture of left pubis, initial encounter for closed fracture: Secondary | ICD-10-CM

## 2023-01-24 NOTE — Telephone Encounter (Signed)
I have left a message asking the patient to call me about which location would work best for her [place] or [address]

## 2023-01-25 NOTE — Telephone Encounter (Signed)
I just went ahead and sent the cpap order to [facility]

## 2023-02-07 ENCOUNTER — Encounter: Payer: Self-pay | Admitting: Internal Medicine

## 2023-02-26 ENCOUNTER — Other Ambulatory Visit: Payer: Self-pay | Admitting: Cardiology

## 2023-02-28 ENCOUNTER — Encounter: Payer: Self-pay | Admitting: Cardiology

## 2023-02-28 ENCOUNTER — Ambulatory Visit: Payer: Medicare HMO | Attending: Cardiology | Admitting: Cardiology

## 2023-02-28 VITALS — BP 130/78 | HR 65 | Ht 68.0 in | Wt 167.0 lb

## 2023-02-28 DIAGNOSIS — I2510 Atherosclerotic heart disease of native coronary artery without angina pectoris: Secondary | ICD-10-CM | POA: Diagnosis not present

## 2023-02-28 DIAGNOSIS — E782 Mixed hyperlipidemia: Secondary | ICD-10-CM

## 2023-02-28 DIAGNOSIS — I10 Essential (primary) hypertension: Secondary | ICD-10-CM | POA: Diagnosis not present

## 2023-02-28 DIAGNOSIS — I6523 Occlusion and stenosis of bilateral carotid arteries: Secondary | ICD-10-CM

## 2023-02-28 DIAGNOSIS — I4819 Other persistent atrial fibrillation: Secondary | ICD-10-CM | POA: Diagnosis not present

## 2023-02-28 NOTE — Progress Notes (Signed)
Clinical Summary Monica Stafford is a 82 y.o.female seen today for follow up of the following medical problems.       1. CAD - history of CABG in 2002 (LIMA-LAD,RIMA-RCA,SVG-OM1,SVG-D2). LVEF 69% by LVgram in 2002  - 03/2017 admit with chest pain. Troponins negative. Nuclear stress without ischemia, low risk - 03/2017 echo LVEF 60-65%, grade I diastolic dysfunction - no beta blocker due to bradycardia     02/2022 nuclear stress ordered for DOE, abnormal referred for cath.   02/2022 cath  Successful two-vessel PCI of the mid RCA and the SVG to OM.     08/2022 echo: LVEF 55-60%, no WMAs, indet diastolic, severe BAE, mod pulm HTN  - no chest pains, denies SOB/DOE.      2. Carotid stenosis - history of left CEA 12/2000 -01/2019 carotid US RICA 1-39%, <50% placque LICA with patent CEA - followed by vascualr    08/2020 RICA 1-39%, LICA patent CEA  08/2021 1-39% bilateral disease 10/2022 carotid US: RICA no significant disease, LICA patent CEA    3. Hyperlipidemia -01/2023 TC 116 TG 96 HDL 44 LDL 54   4.HTN -she is compliant with meds - edema on norvasc 10, has tolerated 5  - she is compliant with meds -does not check regularly at home.    5. Afib  - no recent papitations - no bleeding on eliquis.      6. LE edema - started after norvasc was increased to 10mg  daily back in 10/2015 - norvasc was decreased to 5mg  daily which improved swelling - mild chronic edema. Previously a fairly benign echo, suspect likely component of venous insufficiency  - chroinc stable LE - does forget to take her lasix few time a week    7. Syncope - presented 03/2017 with chest pain and syncope - syncope episode occurred after becoming nauseated - labs suggested she was dehydrated. She had also taken a nitroglycerin while standing around that time.    - no recent issues      8.OSA - followed by Dr Vassie Loll      [facility]:  Works at [employer] 3 days a week.  Son passed away in 05-25-23 from COVID.   She recently got married Past Medical History:  Diagnosis Date   Arthritis    back  all over   CAD (coronary artery disease)    a. s/p CABG in 2002 with LIMA-LAD, RIMA-RCA, SVG-OM1, SVG-D2 b. low-risk NST in 03/2017 c. cath in 02/2022 with DES to mid-RCA and DES to SVG-OM   Hyperlipidemia    Hypertension    PAF (paroxysmal atrial fibrillation) (HCC)    Peripheral vascular disease (HCC)    Pneumonia     50 years  ago   Sleep apnea      study but dont wear   mask     Allergies  Allergen Reactions   Sulfonamide Derivatives Itching     Current Outpatient Medications  Medication Sig Dispense Refill   acetaminophen (TYLENOL) 500 MG tablet Take 1,000 mg by mouth 3 (three) times daily as needed for mild pain or moderate pain.     alendronate (FOSAMAX) 70 MG tablet Take 70 mg by mouth every Monday.     ALPRAZolam (XANAX) 1 MG tablet Take 0.25-0.5 mg by mouth 3 (three) times daily as needed for anxiety.     amLODipine (NORVASC) 5 MG tablet Take 1 tablet by mouth once daily 90 tablet 2   apixaban (ELIQUIS) 5 MG TABS tablet  Take 1 tablet by mouth twice daily 180 tablet 1   atorvastatin (LIPITOR) 40 MG tablet Take 40 mg by mouth every evening.     Calcium Carbonate-Vitamin D 600-400 MG-UNIT tablet Take 1-2 tablets by mouth daily. Take 1 tablet in the morning & take 2 tablets in the evening.     estradiol (ESTRACE) 0.1 MG/GM vaginal cream Place 1 g vaginally every other day as needed (irritation.).     fish oil-omega-3 fatty acids 1000 MG capsule Take 1 g by mouth every evening.     furosemide (LASIX) 20 MG tablet Take 2 tablets (40 mg total) by mouth daily. May take an additional tablet as needed for swelling, weight gain. 180 tablet 1   lisinopril (ZESTRIL) 10 MG tablet Take 10 mg by mouth 2 (two) times daily.     metoprolol tartrate (LOPRESSOR) 25 MG tablet Take 1/2 (one-half) tablet by mouth twice daily. 90 tablet 2   Multiple Vitamin (MULTIVITAMIN WITH MINERALS) TABS Take 1 tablet by  mouth in the morning.     nitroGLYCERIN (NITROSTAT) 0.4 MG SL tablet Place 1 tablet (0.4 mg total) under the tongue every 5 (five) minutes as needed for chest pain. 25 tablet 1   oxyCODONE (ROXICODONE) 5 MG immediate release tablet Take 1 tablet (5 mg total) by mouth every 6 (six) hours as needed for severe pain. 15 tablet 0   Polyethyl Glycol-Propyl Glycol (SYSTANE) 0.4-0.3 % SOLN Place 1 drop into both eyes 4 (four) times daily as needed (dry/irritated eyes.).     potassium chloride SA (KLOR-CON M) 20 MEQ tablet Take 3 tablets (60 mEq total) by mouth daily. 270 tablet 3   raloxifene (EVISTA) 60 MG tablet Take 60 mg by mouth every other day. In the morning     No current facility-administered medications for this visit.     Past Surgical History:  Procedure Laterality Date   ABDOMINAL HYSTERECTOMY  1998   [facility]   CARDIAC CATHETERIZATION     CATARACT EXTRACTION W/PHACO  12/10/2011   Procedure: CATARACT EXTRACTION PHACO AND INTRAOCULAR LENS PLACEMENT (IOC);  Surgeon: Gemma Payor, MD;  Location: [facility];  Service: Ophthalmology;  Laterality: Right;  CDE:10.40   CATARACT EXTRACTION W/PHACO  01/07/2012   Procedure: CATARACT EXTRACTION PHACO AND INTRAOCULAR LENS PLACEMENT (IOC);  Surgeon: Gemma Payor, MD;  Location: [facility];  Service: Ophthalmology;  Laterality: Left;  CDE 8.38   CORONARY ARTERY BYPASS GRAFT  12/23/2000   [facility]   CORONARY STENT INTERVENTION N/A 03/15/2022   Procedure: CORONARY STENT INTERVENTION;  Surgeon: Corky Crafts, MD;  Location: [facility] INVASIVE CV LAB;  Service: Cardiovascular;  Laterality: N/A;   LAPAROSCOPIC APPENDECTOMY N/A 02/27/2016   Procedure: APPENDECTOMY LAPAROSCOPIC;  Surgeon: Franky Macho, MD;  Location: [facility];  Service: General;  Laterality: N/A;   LEFT HEART CATH AND CORS/GRAFTS ANGIOGRAPHY N/A 03/15/2022   Procedure: LEFT HEART CATH AND CORS/GRAFTS ANGIOGRAPHY;  Surgeon: Corky Crafts, MD;  Location: [facility] INVASIVE CV LAB;   Service: Cardiovascular;  Laterality: N/A;     Allergies  Allergen Reactions   Sulfonamide Derivatives Itching      Family History  Problem Relation Age of Onset   Heart disease Father        before age 39   Heart attack Father    Parkinson's disease Sister    Colitis Sister    COPD Maternal Uncle        smoked     Social History Ms. Sumbry reports  that she has never smoked. She has been exposed to tobacco smoke. She has never used smokeless tobacco. Ms. Fenech reports no history of alcohol use.   Review of Systems CONSTITUTIONAL: No weight loss, fever, chills, weakness or fatigue.  HEENT: Eyes: No visual loss, blurred vision, double vision or yellow sclerae.No hearing loss, sneezing, congestion, runny nose or sore throat.  SKIN: No rash or itching.  CARDIOVASCULAR: per hpi RESPIRATORY: per hpi GASTROINTESTINAL: No anorexia, nausea, vomiting or diarrhea. No abdominal pain or blood.  GENITOURINARY: No burning on urination, no polyuria NEUROLOGICAL: No headache, dizziness, syncope, paralysis, ataxia, numbness or tingling in the extremities. No change in bowel or bladder control.  MUSCULOSKELETAL: No muscle, back pain, joint pain or stiffness.  LYMPHATICS: No enlarged nodes. No history of splenectomy.  PSYCHIATRIC: No history of depression or anxiety.  ENDOCRINOLOGIC: No reports of sweating, cold or heat intolerance. No polyuria or polydipsia.  Marland Kitchen   Physical Examination Today's Vitals   02/28/23 1133 02/28/23 1208  BP: (!) 144/74 130/78  Pulse: 65   SpO2: 97%   Weight: 167 lb (75.8 kg)   Height: 5\' 8"  (1.727 m)    Body mass index is 25.39 kg/m.  Gen: resting comfortably, no acute distress HEENT: no scleral icterus, pupils equal round and reactive, no palptable cervical adenopathy,  CV: irreg, no mr/g, no jvd Resp: Clear to auscultation bilaterally GI: abdomen is soft, non-tender, non-distended, normal bowel sounds, no hepatosplenomegaly MSK: extremities  are warm, no edema.  Skin: warm, no rash Neuro:  no focal deficits Psych: appropriate affect   Diagnostic Studies  12/2000 Cath PROCEDURES PERFORMED: 1. Left heart catheterization. 2. Selective coronary arteriography. 3. Selective left ventriculography. 4. Subclavian angiography.   DESCRIPTION OF PROCEDURE:  The procedure was performed from the right femoral artery using 6-French catheters.  She tolerated the procedure well without complications.  She did have elevated blood pressures.  She was given two doses of labetalol 20 mg IV, plus an additional dose of 5 mg of intravenous metoprolol.  There were no complications from the procedure and she was taken to the holding area in satisfactory clinical condition.   HEMODYNAMIC DATA:  Central aortic pressure was 190/83.  LV pressure was 190/20.  There was no gradient on pullback across the aortic valve.   ANGIOGRAPHIC DATA: 1. Left ventriculography was performed in the RAO projection.  Overall    ventricular systolic function was normal.  No segmental abnormalities    or contraction were identified.  There did not appear to be significant    mitral regurgitation.  Ejection fraction was calculated at 69%. 2. Subclavian angiography revealed a patent subclavian artery, as well as    an internal mammary artery.  The internal mammary appeared to be suitable    for grafting. 3. The left main coronary artery was free of critical disease. 4. The left anterior descending artery coursed to the apex where it wrapped    the apical tip.  The LAD had about a 50% area of narrowing beyond the    origin of the first diagonal.  The first diagonal itself had about 50%    narrowing.  Distal to this, there was a segmental area of plaquing leading    into an approximate 90% stenosis just after the large anterolateral Chelan Heringer.    This anterolateral Laithan Conchas itself was a large caliber vessel that had about    80% proximal narrowing.  Both vessels appeared to  be suitable for grafting. 5. The circumflex artery  had mild plaquing of approximately 20% to 30% prior    to the first marginal.  The first marginal itself had about 50% ostial and    then a mid 60% stenosis.  This vessel also appeared to be suitable for    grafting. 6. The right coronary artery was a large dominant vessel providing a moderate    sized posterior descending and a very large posterolateral Keyla Milone.  The    right coronary artery had a 20% to 30% narrowing in the proximal vessel.    In the mid vessel was a focal 90% stenosis.   CONCLUSIONS: 1. Preserved left ventricular function. 2. Patent internal mammary artery. 3. 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 4. Moderate stenosis of the circumflex artery. 5. 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   Cath report  2002 recs:  The patient has three-vessel disease.  The right is suitable for intervention, but the LAD is a complex lesion with a high risk of target vessel revascularization characterized by a long lesion and small caliber vessel.  Based on these risk factors and the multivessel disease, we likely will recommend revascularization surgery.  I will notify her primary care physician and we will get a surgical consultation.DD:  12/20/00 TD:  12/20/00 Job: [id number]      03/2017 Nuclear stress IMPRESSION: 1. Tiny fixed perfusion defect at the apex.   2. Normal left ventricular wall motion.   3. Left ventricular ejection fraction 64%   4. Non invasive risk stratification*: Low risk.   03/2017 echo Study Conclusions   - Left ventricle: The cavity size was normal. Wall thickness was   increased in a pattern of mild LVH. Systolic function was normal.   The estimated ejection fraction was in the range of 60% to 65%.   Chordal SAM noted. Wall motion was normal; there were no regional   wall motion abnormalities. Doppler parameters  are consistent with   abnormal left ventricular relaxation (grade 1 diastolic   dysfunction). - Aortic valve: There was no stenosis. - Mitral valve: Mildly calcified annulus. - Right ventricle: The cavity size was normal. Systolic function   was normal. - Tricuspid valve: Peak RV-RA gradient (S): 20 mm Hg. - Pulmonary arteries: PA peak pressure: 23 mm Hg (S). - Inferior vena cava: The vessel was normal in size. The   respirophasic diameter changes were in the normal range (>= 50%),   consistent with normal central venous pressure.   Impressions:   - Normal LV size with mild LV hypertrophy. EF 60-65%. Normal RV   size and systolic function. No significant valvular   abnormalities.       Assessment and Plan   1. CAD - no recent symptoms, she can d/c plavix at this time as she is one year out from her stents and continue eliquis alone in setting of her afib   2. Carotid stenosis - no significant recent disease, continue to monitor   3. Hyperlipidemia - at goal, continue current meds   4. HTN - at goal, continue curernt meds   5. Afib/acquired thrombophilia -no symptoms, continue current meds   6. LE edema - likely secondary to venous insufficiecny - continue prn lasix   7. Preoperative evaluation - needs lumbar epidural - we are stopping plavix. If needed can hold eliquis 3 days prior and resume 1 day after.  Monica Stafford, M.D

## 2023-02-28 NOTE — Patient Instructions (Signed)
Medication Instructions:  Your physician has recommended you make the following change in your medication:   -Stop Plavix  *If you need a refill on your cardiac medications before your next appointment, please call your pharmacy*   Lab Work: None If you have labs (blood work) drawn today and your tests are completely normal, you will receive your results only by: MyChart Message (if you have MyChart) OR A paper copy in the mail If you have any lab test that is abnormal or we need to change your treatment, we will call you to review the results.   Testing/Procedures: None   Follow-Up: At [facility], you and your health needs are our priority.  As part of our continuing mission to provide you with exceptional heart care, we have created designated Provider Care Teams.  These Care Teams include your primary Cardiologist (physician) and Advanced Practice Providers (APPs -  Physician Assistants and Nurse Practitioners) who all work together to provide you with the care you need, when you need it.  We recommend signing up for the patient portal called "MyChart".  Sign up information is provided on this After Visit Summary.  MyChart is used to connect with patients for Virtual Visits (Telemedicine).  Patients are able to view lab/test results, encounter notes, upcoming appointments, etc.  Non-urgent messages can be sent to your provider as well.   To learn more about what you can do with MyChart, go to [email or web address].    Your next appointment:   6 month(s)  Provider:   You may see Dina Rich, MD or one of the following Advanced Practice Providers on your designated Care Team:   Randall An, PA-C  Jacolyn Reedy, [place]     Other Instructions

## 2023-04-15 ENCOUNTER — Other Ambulatory Visit: Payer: Self-pay | Admitting: Family Medicine

## 2023-04-15 DIAGNOSIS — M25551 Pain in right hip: Secondary | ICD-10-CM

## 2023-06-03 ENCOUNTER — Other Ambulatory Visit: Payer: Self-pay | Admitting: Cardiology

## 2023-06-07 NOTE — Telephone Encounter (Signed)
Prescription refill request for Eliquis received. Indication:afib Last office visit:9/24 Scr:0.75  8/24 Age: 82 Weight:75.8  kg  Prescription refilled

## 2023-06-13 ENCOUNTER — Ambulatory Visit: Payer: Medicare HMO | Admitting: Primary Care

## 2023-06-13 VITALS — BP 160/68 | HR 90 | Ht 68.0 in | Wt 172.0 lb

## 2023-06-13 DIAGNOSIS — G4733 Obstructive sleep apnea (adult) (pediatric): Secondary | ICD-10-CM | POA: Diagnosis not present

## 2023-06-13 NOTE — Patient Instructions (Addendum)
-  OBSTRUCTIVE SLEEP APNEA: Obstructive Sleep Apnea is a condition where your breathing repeatedly stops and starts during sleep. You have been experiencing skin irritation from mouth tape, which has affected your CPAP use. You have now received a full face mask and should resume CPAP therapy. We will check your compliance and the effectiveness of the therapy in 3 months.  -DRY SKIN: Dry skin can be caused by various factors including weather changes and medication use. You have been experiencing dry, itchy skin. To help manage this, use an over-the-counter barrier cream such as Cetaphil or Aquaphor and stay hydrated. If there is no improvement, follow up with your primary care provider.  -LISINOPRIL USE: Lisinopril is a medication used to treat high blood pressure. There has been a recall notice due to a foreign object found in the medication. Please bring your current medication to the pharmacy to check if it is part of the recalled batch. If it is not part of the recall, you can continue with your current dosage.  Instructions: - Resume using your CPAP therapy with the new full face mask and we will review your progress in 3 months.  - For your dry skin, use an over-the-counter barrier cream and stay hydrated. If your skin does not improve, follow up with your primary care provider.  - Additionally, bring your Lisinopril medication to the pharmacy to check if it is part of the recall. If it is not, you can continue taking it as prescribed.  Follow-up -3 months with Waynetta Sandy NP/OR APP

## 2023-06-13 NOTE — Progress Notes (Signed)
@Patient  ID: Monica Stafford, female    DOB: Nov 04, 1940, 82 y.o.   MRN: [id number]  No chief complaint on file.   Referring provider: Benita Stabile, MD  HPI: 82 year old female, never smoked(passive exposure). PMH significant for CAD, CABG x4, HTN, NSTEMI, secondary pulmonary hypertension, OSA, dyslipidemia.  06/13/2023 Discussed the use of AI scribe software for clinical note transcription with the patient, who gave verbal consent to proceed.  History of Present Illness   The patient, with a history of moderate sleep apnea managed with CPAP, reports a recent decrease in compliance due to skin irritation from mouth tape. The patient was using the CPAP consistently until mid-November when she developed a scaly, raw skin irritation on the upper lip. The patient has since received a full face mask from the medical supply store and intends to resume use.  She reports frequent nocturia, waking up four to five times a night to urinate. She takes a diuretic in the morning and notes a significant thirst, consuming a lot of water throughout the day.  Lastly, the patient mentions a recent letter from [facility] regarding a recall on her Lisinopril medication due to a foreign object found in the medication. The patient has been experiencing skin itching and wonders if it could be related to the medication. She plans to consult with the pharmacy about the recall.      Airview download 03/15/23-06/12/23 Usage 46/90 days (51%); Usage 36 days (40%) Average usage days used 4 hours 49 min Pressure 5-12cm h20 Airleaks 42L/min (95%) AHI 2.1   Allergies  Allergen Reactions   Sulfonamide Derivatives Itching    Immunization History  Administered Date(s) Administered   Influenza, High Dose Seasonal PF 03/08/2018   Influenza, Quadrivalent, Recombinant, Inj, Pf 04/06/2019   Pneumococcal Conjugate-13 05/21/2019    Past Medical History:  Diagnosis Date   Arthritis    back  all over   CAD (coronary  artery disease)    a. s/p CABG in 2002 with LIMA-LAD, RIMA-RCA, SVG-OM1, SVG-D2 b. low-risk NST in 03/2017 c. cath in 02/2022 with DES to mid-RCA and DES to SVG-OM   Hyperlipidemia    Hypertension    PAF (paroxysmal atrial fibrillation) (HCC)    Peripheral vascular disease (HCC)    Pneumonia     50 years  ago   Sleep apnea      study but dont wear   mask    Tobacco History: Social History   Tobacco Use  Smoking Status Never   Passive exposure: Past  Smokeless Tobacco Never   Counseling given: Not Answered   Outpatient Medications Prior to Visit  Medication Sig Dispense Refill   acetaminophen (TYLENOL) 500 MG tablet Take 1,000 mg by mouth 3 (three) times daily as needed for mild pain or moderate pain.     alendronate (FOSAMAX) 70 MG tablet Take 70 mg by mouth every Monday.     ALPRAZolam (XANAX) 1 MG tablet Take 0.25-0.5 mg by mouth 3 (three) times daily as needed for anxiety.     amLODipine (NORVASC) 5 MG tablet Take 1 tablet by mouth once daily 90 tablet 2   apixaban (ELIQUIS) 5 MG TABS tablet Take 1 tablet by mouth twice daily 180 tablet 1   atorvastatin (LIPITOR) 40 MG tablet Take 40 mg by mouth every evening.     Calcium Carbonate-Vitamin D 600-400 MG-UNIT tablet Take 1-2 tablets by mouth daily. Take 1 tablet in the morning & take 2 tablets in the evening.  estradiol (ESTRACE) 0.1 MG/GM vaginal cream Place 1 g vaginally every other day as needed (irritation.).     fish oil-omega-3 fatty acids 1000 MG capsule Take 1 g by mouth every evening.     furosemide (LASIX) 20 MG tablet Take 2 tablets (40 mg total) by mouth daily. May take an additional tablet as needed for swelling, weight gain. 180 tablet 1   lisinopril (ZESTRIL) 10 MG tablet Take 10 mg by mouth 2 (two) times daily.     metoprolol tartrate (LOPRESSOR) 25 MG tablet Take 1/2 (one-half) tablet by mouth twice daily 90 tablet 0   Multiple Vitamin (MULTIVITAMIN WITH MINERALS) TABS Take 1 tablet by mouth in the morning.      nitroGLYCERIN (NITROSTAT) 0.4 MG SL tablet Place 1 tablet (0.4 mg total) under the tongue every 5 (five) minutes as needed for chest pain. 25 tablet 1   oxyCODONE (ROXICODONE) 5 MG immediate release tablet Take 1 tablet (5 mg total) by mouth every 6 (six) hours as needed for severe pain. 15 tablet 0   Polyethyl Glycol-Propyl Glycol (SYSTANE) 0.4-0.3 % SOLN Place 1 drop into both eyes 4 (four) times daily as needed (dry/irritated eyes.).     potassium chloride SA (KLOR-CON M) 20 MEQ tablet Take 3 tablets (60 mEq total) by mouth daily. 270 tablet 3   raloxifene (EVISTA) 60 MG tablet Take 60 mg by mouth every other day. In the morning     No facility-administered medications prior to visit.   Review of Systems  Review of Systems  Constitutional:  Positive for fatigue.  Respiratory: Negative.    Cardiovascular: Negative.   Psychiatric/Behavioral:  Positive for sleep disturbance.     Physical Exam  There were no vitals taken for this visit. Physical Exam Constitutional:      General: She is not in acute distress.    Appearance: She is not ill-appearing.  HENT:     Head: Normocephalic and atraumatic.     Mouth/Throat:     Mouth: Mucous membranes are moist.     Pharynx: Oropharynx is clear.  Cardiovascular:     Rate and Rhythm: Normal rate. Rhythm irregular.  Pulmonary:     Effort: Pulmonary effort is normal.     Breath sounds: Normal breath sounds.  Musculoskeletal:        General: Normal range of motion.  Skin:    General: Skin is warm and dry.  Neurological:     General: No focal deficit present.     Mental Status: She is alert and oriented to person, place, and time. Mental status is at baseline.  Psychiatric:        Mood and Affect: Mood normal.        Behavior: Behavior normal.        Thought Content: Thought content normal.        Judgment: Judgment normal.      Lab Results:  CBC    Component Value Date/Time   WBC 6.6 12/07/2022 1820   RBC 3.83 (L) 12/07/2022  1820   HGB 13.5 12/07/2022 1820   HCT 39.8 12/07/2022 1820   PLT 127 (L) 12/07/2022 1820   MCV 103.9 (H) 12/07/2022 1820   MCH 35.2 (H) 12/07/2022 1820   MCHC 33.9 12/07/2022 1820   RDW 13.7 12/07/2022 1820   LYMPHSABS 2.5 03/19/2017 1848   MONOABS 0.9 03/19/2017 1848   EOSABS 0.2 03/19/2017 1848   BASOSABS 0.1 03/19/2017 1848    BMET    Component Value Date/Time  NA 138 12/07/2022 1820   K 3.2 (L) 12/07/2022 1820   CL 102 12/07/2022 1820   CO2 29 12/07/2022 1820   GLUCOSE 109 (H) 12/07/2022 1820   BUN 19 12/07/2022 1820   CREATININE 0.75 12/07/2022 1820   CREATININE 0.79 04/25/2021 0859   CALCIUM 8.6 (L) 12/07/2022 1820   GFRNONAA >60 12/07/2022 1820   GFRAA >60 12/23/2018 0933    BNP    Component Value Date/Time   BNP 229.0 (H) 03/01/2022 1504    ProBNP No results found for: "PROBNP"  Imaging: No results found.   Assessment & Plan:   1. OBSTRUCTIVE SLEEP APNEA (Primary)     Obstructive Sleep Apnea Moderate OSA, non-compliant with CPAP therapy recently due to skin irritation from mouth tape. Received a NEW full face mask. -Advised patient resume CPAP therapy with new full face mask, aim 4-6 hours per night  -Check compliance and effectiveness in 3 months.  Dry Skin Complaints of dry, itchy skin. No visible rash. Possible causes include weather changes or diuretic use. -Advised patient use over-the-counter barrier cream such as Cetaphil or Aquaphor. -If no improvement, consider follow-up with primary care provider.  Lisinopril Use Concerns about recent recall due to foreign object found in medication. -Advised patient bring current medication to pharmacy to check if it is part of the recalled batch. -If not part of recall, continue current dosage.      Afib - Not new, confirmed on previous EKG. Rhythm appears irregular today on exam, rate is within normal limits. Continue Eliquis 5mg  twice daily. Follows with Cardiology   Glenford Bayley,  NP 06/13/2023

## 2023-07-18 DIAGNOSIS — H43393 Other vitreous opacities, bilateral: Secondary | ICD-10-CM | POA: Diagnosis not present

## 2023-08-02 DIAGNOSIS — M810 Age-related osteoporosis without current pathological fracture: Secondary | ICD-10-CM | POA: Diagnosis not present

## 2023-08-02 DIAGNOSIS — E782 Mixed hyperlipidemia: Secondary | ICD-10-CM | POA: Diagnosis not present

## 2023-08-02 DIAGNOSIS — R7301 Impaired fasting glucose: Secondary | ICD-10-CM | POA: Diagnosis not present

## 2023-08-09 DIAGNOSIS — M810 Age-related osteoporosis without current pathological fracture: Secondary | ICD-10-CM | POA: Diagnosis not present

## 2023-08-09 DIAGNOSIS — I480 Paroxysmal atrial fibrillation: Secondary | ICD-10-CM | POA: Diagnosis not present

## 2023-08-09 DIAGNOSIS — E782 Mixed hyperlipidemia: Secondary | ICD-10-CM | POA: Diagnosis not present

## 2023-08-09 DIAGNOSIS — S3282XD Multiple fractures of pelvis without disruption of pelvic ring, subsequent encounter for fracture with routine healing: Secondary | ICD-10-CM | POA: Diagnosis not present

## 2023-08-09 DIAGNOSIS — I6529 Occlusion and stenosis of unspecified carotid artery: Secondary | ICD-10-CM | POA: Diagnosis not present

## 2023-08-09 DIAGNOSIS — F411 Generalized anxiety disorder: Secondary | ICD-10-CM | POA: Diagnosis not present

## 2023-08-09 DIAGNOSIS — Z955 Presence of coronary angioplasty implant and graft: Secondary | ICD-10-CM | POA: Diagnosis not present

## 2023-08-09 DIAGNOSIS — M7989 Other specified soft tissue disorders: Secondary | ICD-10-CM | POA: Diagnosis not present

## 2023-08-09 DIAGNOSIS — I119 Hypertensive heart disease without heart failure: Secondary | ICD-10-CM | POA: Diagnosis not present

## 2023-08-09 DIAGNOSIS — I10 Essential (primary) hypertension: Secondary | ICD-10-CM | POA: Diagnosis not present

## 2023-08-09 DIAGNOSIS — M25551 Pain in right hip: Secondary | ICD-10-CM | POA: Diagnosis not present

## 2023-08-09 DIAGNOSIS — I25810 Atherosclerosis of coronary artery bypass graft(s) without angina pectoris: Secondary | ICD-10-CM | POA: Diagnosis not present

## 2023-08-29 DIAGNOSIS — G4733 Obstructive sleep apnea (adult) (pediatric): Secondary | ICD-10-CM | POA: Diagnosis not present

## 2023-09-02 ENCOUNTER — Other Ambulatory Visit: Payer: Self-pay | Admitting: Cardiology

## 2023-09-10 ENCOUNTER — Encounter: Payer: Self-pay | Admitting: Cardiology

## 2023-09-10 ENCOUNTER — Ambulatory Visit: Payer: Medicare HMO | Attending: Cardiology | Admitting: Cardiology

## 2023-09-10 VITALS — BP 126/60 | HR 48 | Ht 68.0 in | Wt 168.0 lb

## 2023-09-10 DIAGNOSIS — I6523 Occlusion and stenosis of bilateral carotid arteries: Secondary | ICD-10-CM | POA: Diagnosis not present

## 2023-09-10 DIAGNOSIS — I10 Essential (primary) hypertension: Secondary | ICD-10-CM

## 2023-09-10 DIAGNOSIS — E782 Mixed hyperlipidemia: Secondary | ICD-10-CM | POA: Diagnosis not present

## 2023-09-10 DIAGNOSIS — I2510 Atherosclerotic heart disease of native coronary artery without angina pectoris: Secondary | ICD-10-CM | POA: Diagnosis not present

## 2023-09-10 DIAGNOSIS — D6869 Other thrombophilia: Secondary | ICD-10-CM | POA: Diagnosis not present

## 2023-09-10 DIAGNOSIS — I4819 Other persistent atrial fibrillation: Secondary | ICD-10-CM

## 2023-09-10 NOTE — Patient Instructions (Signed)
 Medication Instructions:  Continue all current medications.   Labwork: none  Testing/Procedures: none  Follow-Up: 6 months   Any Other Special Instructions Will Be Listed Below (If Applicable).   If you need a refill on your cardiac medications before your next appointment, please call your pharmacy.

## 2023-09-10 NOTE — Progress Notes (Signed)
 Clinical Summary Ms. Quattrone is a 83 y.o.female seen today for follow up of the following medical problems.      1. CAD - history of CABG in 2002 (LIMA-LAD,RIMA-RCA,SVG-OM1,SVG-D2). LVEF 69% by LVgram in 2002  - 03/2017 admit with chest pain. Troponins negative. Nuclear stress without ischemia, low risk - 03/2017 echo LVEF 60-65%, grade I diastolic dysfunction - no beta blocker due to bradycardia     02/2022 nuclear stress ordered for DOE, abnormal referred for cath.   02/2022 cath  Successful two-vessel PCI of the mid RCA and the SVG to OM.     08/2022 echo: LVEF 55-60%, no WMAs, indet diastolic, severe BAE, mod pulm HTN    - no recent chest pains, no SOB/DOE - compliant with meds EKG today afib, rates 49. No acute ischemic changes     2. Carotid stenosis - history of left CEA 12/2000 -01/2019 carotid US RICA 1-39%, <50% placque LICA with patent CEA - followed by vascualr    08/2020 RICA 1-39%, LICA patent CEA  08/2021 1-39% bilateral disease 10/2022 carotid US: RICA no significant disease, LICA patent CEA     3. Hyperlipidemia -01/2023 TC 116 TG 96 HDL 44 LDL 54 - 07/2023 TC 161 TG 096 HDL 45 LDL 56     4.HTN -she is compliant with meds - edema on norvasc 10, has tolerated 5   - compliant with meds   5. Afib  - no recent palpitations - compliant with meds - no lightheadedness, no dizziness - no bleeding on eliquis.      6. LE edema - started after norvasc was increased to 10mg  daily back in 10/2015 - norvasc was decreased to 5mg  daily which improved swelling - mild chronic edema. Previously a fairly benign echo, suspect likely component of venous insufficiency   - some swelling at times. Takes lasix 20mg  daily, at times will take additional prn.     7. Syncope - presented 03/2017 with chest pain and syncope - syncope episode occurred after becoming nauseated - labs suggested she was dehydrated. She had also taken a nitroglycerin while standing around that  time.    - no recent issues      8.OSA - followed by Dr Vassie Loll - compliant with cpap       SH:  Works at [employer] 3 days a week.  Son passed away in Jun 10, 2024 from COVID.  She recently got married Past Medical History:  Diagnosis Date   Arthritis    back  all over   CAD (coronary artery disease)    a. s/p CABG in 2002 with LIMA-LAD, RIMA-RCA, SVG-OM1, SVG-D2 b. low-risk NST in 03/2017 c. cath in 02/2022 with DES to mid-RCA and DES to SVG-OM   Hyperlipidemia    Hypertension    PAF (paroxysmal atrial fibrillation) (HCC)    Peripheral vascular disease (HCC)    Pneumonia     50 years  ago   Sleep apnea      study but dont wear   mask     Allergies  Allergen Reactions   Sulfonamide Derivatives Itching     Current Outpatient Medications  Medication Sig Dispense Refill   acetaminophen (TYLENOL) 500 MG tablet Take 1,000 mg by mouth 3 (three) times daily as needed for mild pain or moderate pain.     alendronate (FOSAMAX) 70 MG tablet Take 70 mg by mouth every Monday.     ALPRAZolam (XANAX) 1 MG tablet Take 0.25-0.5 mg by mouth  3 (three) times daily as needed for anxiety.     amLODipine (NORVASC) 5 MG tablet Take 1 tablet by mouth once daily 90 tablet 2   apixaban (ELIQUIS) 5 MG TABS tablet Take 1 tablet by mouth twice daily 180 tablet 1   atorvastatin (LIPITOR) 40 MG tablet Take 40 mg by mouth every evening.     Calcium Carbonate-Vitamin D 600-400 MG-UNIT tablet Take 1-2 tablets by mouth daily. Take 1 tablet in the morning & take 2 tablets in the evening.     estradiol (ESTRACE) 0.1 MG/GM vaginal cream Place 1 g vaginally every other day as needed (irritation.).     fish oil-omega-3 fatty acids 1000 MG capsule Take 1 g by mouth every evening.     furosemide (LASIX) 20 MG tablet Take 2 tablets (40 mg total) by mouth daily. May take an additional tablet as needed for swelling, weight gain. 180 tablet 1   lisinopril (ZESTRIL) 10 MG tablet Take 10 mg by mouth 2 (two) times daily.      metoprolol tartrate (LOPRESSOR) 25 MG tablet Take 1/2 (one-half) tablet by mouth twice daily 90 tablet 2   Multiple Vitamin (MULTIVITAMIN WITH MINERALS) TABS Take 1 tablet by mouth in the morning.     nitroGLYCERIN (NITROSTAT) 0.4 MG SL tablet Place 1 tablet (0.4 mg total) under the tongue every 5 (five) minutes as needed for chest pain. 25 tablet 1   oxyCODONE (ROXICODONE) 5 MG immediate release tablet Take 1 tablet (5 mg total) by mouth every 6 (six) hours as needed for severe pain. 15 tablet 0   Polyethyl Glycol-Propyl Glycol (SYSTANE) 0.4-0.3 % SOLN Place 1 drop into both eyes 4 (four) times daily as needed (dry/irritated eyes.).     potassium chloride SA (KLOR-CON M) 20 MEQ tablet Take 3 tablets (60 mEq total) by mouth daily. 270 tablet 3   raloxifene (EVISTA) 60 MG tablet Take 60 mg by mouth every other day. In the morning     No current facility-administered medications for this visit.     Past Surgical History:  Procedure Laterality Date   ABDOMINAL HYSTERECTOMY  1998   [facility]   CARDIAC CATHETERIZATION     CATARACT EXTRACTION W/PHACO  12/10/2011   Procedure: CATARACT EXTRACTION PHACO AND INTRAOCULAR LENS PLACEMENT (IOC);  Surgeon: Gemma Payor, MD;  Location: [facility];  Service: Ophthalmology;  Laterality: Right;  CDE:10.40   CATARACT EXTRACTION W/PHACO  01/07/2012   Procedure: CATARACT EXTRACTION PHACO AND INTRAOCULAR LENS PLACEMENT (IOC);  Surgeon: Gemma Payor, MD;  Location: [facility];  Service: Ophthalmology;  Laterality: Left;  CDE 8.38   CORONARY ARTERY BYPASS GRAFT  12/23/2000   [facility]   CORONARY STENT INTERVENTION N/A 03/15/2022   Procedure: CORONARY STENT INTERVENTION;  Surgeon: Corky Crafts, MD;  Location: [facility] INVASIVE CV LAB;  Service: Cardiovascular;  Laterality: N/A;   LAPAROSCOPIC APPENDECTOMY N/A 02/27/2016   Procedure: APPENDECTOMY LAPAROSCOPIC;  Surgeon: Franky Macho, MD;  Location: [facility];  Service: General;  Laterality: N/A;   LEFT HEART CATH  AND CORS/GRAFTS ANGIOGRAPHY N/A 03/15/2022   Procedure: LEFT HEART CATH AND CORS/GRAFTS ANGIOGRAPHY;  Surgeon: Corky Crafts, MD;  Location: [facility] INVASIVE CV LAB;  Service: Cardiovascular;  Laterality: N/A;     Allergies  Allergen Reactions   Sulfonamide Derivatives Itching      Family History  Problem Relation Age of Onset   Heart disease Father        before age 46   Heart attack  Father    Parkinson's disease Sister    Colitis Sister    COPD Maternal Uncle        smoked     Social History Ms. Vantassell reports that she has never smoked. She has been exposed to tobacco smoke. She has never used smokeless tobacco. Ms. Sutcliffe reports no history of alcohol use.     Physical Examination Today's Vitals   09/10/23 1022  BP: 126/60  Pulse: (!) 48  SpO2: 98%  Weight: 168 lb (76.2 kg)  Height: 5\' 8"  (1.727 m)   Body mass index is 25.54 kg/m.  Gen: resting comfortably, no acute distress HEENT: no scleral icterus, pupils equal round and reactive, no palptable cervical adenopathy,  CV: irreg, no mrg, no jvd Resp: Clear to auscultation bilaterally GI: abdomen is soft, non-tender, non-distended, normal bowel sounds, no hepatosplenomegaly MSK: extremities are warm, no edema.  Skin: warm, no rash Neuro:  no focal deficits Psych: appropriate affect   Diagnostic Studies 12/2000 Cath PROCEDURES PERFORMED: 1. Left heart catheterization. 2. Selective coronary arteriography. 3. Selective left ventriculography. 4. Subclavian angiography.   DESCRIPTION OF PROCEDURE:  The procedure was performed from the right femoral artery using 6-French catheters.  She tolerated the procedure well without complications.  She did have elevated blood pressures.  She was given two doses of labetalol 20 mg IV, plus an additional dose of 5 mg of intravenous metoprolol.  There were no complications from the procedure and she was taken to the holding area in satisfactory clinical  condition.   HEMODYNAMIC DATA:  Central aortic pressure was 190/83.  LV pressure was 190/20.  There was no gradient on pullback across the aortic valve.   ANGIOGRAPHIC DATA: 1. Left ventriculography was performed in the RAO projection.  Overall    ventricular systolic function was normal.  No segmental abnormalities    or contraction were identified.  There did not appear to be significant    mitral regurgitation.  Ejection fraction was calculated at 69%. 2. Subclavian angiography revealed a patent subclavian artery, as well as    an internal mammary artery.  The internal mammary appeared to be suitable    for grafting. 3. The left main coronary artery was free of critical disease. 4. The left anterior descending artery coursed to the apex where it wrapped    the apical tip.  The LAD had about a 50% area of narrowing beyond the    origin of the first diagonal.  The first diagonal itself had about 50%    narrowing.  Distal to this, there was a segmental area of plaquing leading    into an approximate 90% stenosis just after the large anterolateral Mayda Shippee.    This anterolateral Zyara Riling itself was a large caliber vessel that had about    80% proximal narrowing.  Both vessels appeared to be suitable for grafting. 5. The circumflex artery had mild plaquing of approximately 20% to 30% prior    to the first marginal.  The first marginal itself had about 50% ostial and    then a mid 60% stenosis.  This vessel also appeared to be suitable for    grafting. 6. The right coronary artery was a large dominant vessel providing a moderate    sized posterior descending and a very large posterolateral Deavion Strider.  The    right coronary artery had a 20% to 30% narrowing in the proximal vessel.    In the mid vessel was a focal 90% stenosis.  CONCLUSIONS: 1. Preserved left ventricular function. 2. Patent internal mammary artery. 3. 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 4.  Moderate stenosis of the circumflex artery. 5. 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   Cath report  2002 recs:  The patient has three-vessel disease.  The right is suitable for intervention, but the LAD is a complex lesion with a high risk of target vessel revascularization characterized by a long lesion and small caliber vessel.  Based on these risk factors and the multivessel disease, we likely will recommend revascularization surgery.  I will notify her primary care physician and we will get a surgical consultation.DD:  12/20/00 TD:  12/20/00 Job: [id number]      03/2017 Nuclear stress IMPRESSION: 1. Tiny fixed perfusion defect at the apex.   2. Normal left ventricular wall motion.   3. Left ventricular ejection fraction 64%   4. Non invasive risk stratification*: Low risk.   03/2017 echo Study Conclusions   - Left ventricle: The cavity size was normal. Wall thickness was   increased in a pattern of mild LVH. Systolic function was normal.   The estimated ejection fraction was in the range of 60% to 65%.   Chordal SAM noted. Wall motion was normal; there were no regional   wall motion abnormalities. Doppler parameters are consistent with   abnormal left ventricular relaxation (grade 1 diastolic   dysfunction). - Aortic valve: There was no stenosis. - Mitral valve: Mildly calcified annulus. - Right ventricle: The cavity size was normal. Systolic function   was normal. - Tricuspid valve: Peak RV-RA gradient (S): 20 mm Hg. - Pulmonary arteries: PA peak pressure: 23 mm Hg (S). - Inferior vena cava: The vessel was normal in size. The   respirophasic diameter changes were in the normal range (>= 50%),   consistent with normal central venous pressure.   Impressions:   - Normal LV size with mild LV hypertrophy. EF 60-65%. Normal RV   size and systolic function. No significant valvular   abnormalities.       Assessment and Plan  1. CAD -no symptoms, continue current meds   2. Carotid stenosis - most recent US last year benign, has been stable over several years. Will increase interval to carotid US every 2 years   3. Hyperlipidemia - she is at goal, continue current meds   4. HTN - bp is at goal, continue current meds   5. Afib/acquired thrombophilia -no symptoms - EKG today afib rate 48, no symptoms of bradycardia. Continue current therapy at this time, if progression of bradycardia can d/c lopressor - continue eliquis for stroke prevention   6. LE edema - likely secondary to venous insufficiecny - she will continue lasix.    F/u 6 months     Antoine Poche, M.D.

## 2023-09-19 DIAGNOSIS — M25551 Pain in right hip: Secondary | ICD-10-CM | POA: Diagnosis not present

## 2023-09-21 DIAGNOSIS — G4733 Obstructive sleep apnea (adult) (pediatric): Secondary | ICD-10-CM | POA: Diagnosis not present

## 2023-09-29 DIAGNOSIS — G4733 Obstructive sleep apnea (adult) (pediatric): Secondary | ICD-10-CM | POA: Diagnosis not present

## 2023-10-01 ENCOUNTER — Ambulatory Visit: Payer: Medicare HMO | Admitting: Primary Care

## 2023-10-01 ENCOUNTER — Telehealth: Payer: Self-pay | Admitting: Primary Care

## 2023-10-01 NOTE — Telephone Encounter (Signed)
 Spoke with patient regarding her 10/01/23 10:00 am appointment with Eulas Hick, NP (provider is out)  rescheduled to 12/26/23 at 10:30 am---will mail information to patient and she voiced her understanding

## 2023-10-04 ENCOUNTER — Other Ambulatory Visit: Payer: Self-pay | Admitting: Cardiology

## 2023-10-29 DIAGNOSIS — G4733 Obstructive sleep apnea (adult) (pediatric): Secondary | ICD-10-CM | POA: Diagnosis not present

## 2023-11-12 ENCOUNTER — Other Ambulatory Visit: Payer: Self-pay | Admitting: Nurse Practitioner

## 2023-11-18 DIAGNOSIS — N3090 Cystitis, unspecified without hematuria: Secondary | ICD-10-CM | POA: Diagnosis not present

## 2023-11-18 DIAGNOSIS — N390 Urinary tract infection, site not specified: Secondary | ICD-10-CM | POA: Diagnosis not present

## 2023-11-26 ENCOUNTER — Other Ambulatory Visit: Payer: Self-pay | Admitting: Cardiology

## 2023-11-29 DIAGNOSIS — G4733 Obstructive sleep apnea (adult) (pediatric): Secondary | ICD-10-CM | POA: Diagnosis not present

## 2023-12-02 ENCOUNTER — Other Ambulatory Visit: Payer: Self-pay | Admitting: Cardiology

## 2023-12-02 DIAGNOSIS — I4819 Other persistent atrial fibrillation: Secondary | ICD-10-CM

## 2023-12-02 NOTE — Telephone Encounter (Signed)
 Prescription refill request for Eliquis  received. Indication: Afib  Last office visit: 09/10/23 ([place])  Scr: 0.70 (08/02/23)  Age: 83 Weight: 76.2kg  Appropriate dose. Refill sent.

## 2023-12-11 ENCOUNTER — Other Ambulatory Visit (HOSPITAL_COMMUNITY): Payer: Self-pay | Admitting: Internal Medicine

## 2023-12-11 DIAGNOSIS — Z1231 Encounter for screening mammogram for malignant neoplasm of breast: Secondary | ICD-10-CM

## 2023-12-12 DIAGNOSIS — G4733 Obstructive sleep apnea (adult) (pediatric): Secondary | ICD-10-CM | POA: Diagnosis not present

## 2023-12-18 ENCOUNTER — Inpatient Hospital Stay (HOSPITAL_COMMUNITY): Admission: RE | Admit: 2023-12-18 | Source: Ambulatory Visit

## 2023-12-26 ENCOUNTER — Ambulatory Visit: Admitting: Adult Health

## 2023-12-26 ENCOUNTER — Encounter: Payer: Self-pay | Admitting: Adult Health

## 2023-12-26 VITALS — BP 124/72 | HR 53 | Ht 68.0 in | Wt 172.0 lb

## 2023-12-26 DIAGNOSIS — G4733 Obstructive sleep apnea (adult) (pediatric): Secondary | ICD-10-CM | POA: Diagnosis not present

## 2023-12-26 NOTE — Patient Instructions (Signed)
 Set up CPAP titration study.  Wear CPAP all night long for at least 6hrs each night .  Work on healthy weight.  Follow up in 3-4 months and As needed

## 2023-12-26 NOTE — Progress Notes (Signed)
 @Patient  ID: Monica Stafford, female    DOB: 01-16-1941, 83 y.o.   MRN: [id number]  Chief Complaint  Patient presents with   Follow-up    Referring provider: Shona Norleen PEDLAR, MD  HPI: 83 yo female never smoker followed for Sleep apnea.    TEST/EVENTS :  HST 11/2022 mod AHI 26/h, low sat 75%  Echo 08/2022 normal LV function, dilated RA, normal RV function, RVSP 45 mm 07/2008 NPSG - mod OSA, AHI 15, mostly REM related ,low sat 79% , BMI 27   12/26/2023  Discussed the use of AI scribe software for clinical note transcription with the patient, who gave verbal consent to proceed.  History of Present Illness   Monica Stafford is an 83 year old female with moderate sleep apnea who presents for a checkup.  She has moderate sleep apnea, diagnosed last year, with 26 apneic events per hour. She uses a CPAP but has trouble tolerating.  Says that the mask is very uncomfortable.  She does use a fullface mask and a nasal mask.  CPAP download shows 90% usage.  Daily average usage at 4 hours.  AHI 10.7/hour.  She is on auto CPAP 5 to 12 cm H2O.  Daily average pressure 11.7 cm H2O.  Patient says she feels tired and fatigued during the daytime.  Sleep is very restless.    Allergies  Allergen Reactions   Sulfonamide Derivatives Itching    Immunization History  Administered Date(s) Administered   Influenza, High Dose Seasonal PF 03/08/2018   Influenza, Quadrivalent, Recombinant, Inj, Pf 04/06/2019   Pneumococcal Conjugate-13 05/21/2019    Past Medical History:  Diagnosis Date   Arthritis    back  all over   CAD (coronary artery disease)    a. s/p CABG in 2002 with LIMA-LAD, RIMA-RCA, SVG-OM1, SVG-D2 b. low-risk NST in 03/2017 c. cath in 02/2022 with DES to mid-RCA and DES to SVG-OM   Hyperlipidemia    Hypertension    PAF (paroxysmal atrial fibrillation) (HCC)    Peripheral vascular disease (HCC)    Pneumonia     50 years  ago   Sleep apnea      study but dont wear   mask    Tobacco  History: Social History   Tobacco Use  Smoking Status Never   Passive exposure: Past  Smokeless Tobacco Never   Counseling given: Not Answered   Outpatient Medications Prior to Visit  Medication Sig Dispense Refill   acetaminophen  (TYLENOL ) 500 MG tablet Take 1,000 mg by mouth 3 (three) times daily as needed for mild pain or moderate pain.     alendronate  (FOSAMAX ) 70 MG tablet Take 70 mg by mouth every Monday.     ALPRAZolam  (XANAX ) 1 MG tablet Take 0.25-0.5 mg by mouth 3 (three) times daily as needed for anxiety.     amLODipine  (NORVASC ) 5 MG tablet Take 1 tablet by mouth once daily 90 tablet 2   apixaban  (ELIQUIS ) 5 MG TABS tablet Take 1 tablet by mouth twice daily 180 tablet 1   atorvastatin  (LIPITOR) 40 MG tablet Take 40 mg by mouth every evening.     Calcium  Carbonate-Vitamin D  600-400 MG-UNIT tablet Take 1-2 tablets by mouth daily. Take 1 tablet in the morning & take 2 tablets in the evening.     estradiol  (ESTRACE ) 0.1 MG/GM vaginal cream Place 1 g vaginally every other day as needed (irritation.).     fish oil-omega-3 fatty acids 1000 MG capsule Take 1 g by  mouth every evening.     furosemide  (LASIX ) 20 MG tablet TAKE 1 TABLET BY MOUTH ONCE DAILY -  MAY  TAKE  AN  ADDITIONAL  TABLET  AS  NEEDED  FOR  SWELLING,  WEIGHT  GAIN 180 tablet 0   lisinopril  (ZESTRIL ) 10 MG tablet Take 10 mg by mouth 2 (two) times daily.     metoprolol  tartrate (LOPRESSOR ) 25 MG tablet Take 1/2 (one-half) tablet by mouth twice daily 90 tablet 2   Multiple Vitamin (MULTIVITAMIN WITH MINERALS) TABS Take 1 tablet by mouth in the morning.     nitroGLYCERIN  (NITROSTAT ) 0.4 MG SL tablet Place 1 tablet (0.4 mg total) under the tongue every 5 (five) minutes as needed for chest pain. 25 tablet 1   oxyCODONE  (ROXICODONE ) 5 MG immediate release tablet Take 1 tablet (5 mg total) by mouth every 6 (six) hours as needed for severe pain. 15 tablet 0   Polyethyl Glycol-Propyl Glycol (SYSTANE) 0.4-0.3 % SOLN Place 1 drop  into both eyes 4 (four) times daily as needed (dry/irritated eyes.).     potassium chloride  SA (KLOR-CON  M) 20 MEQ tablet Take 3 tablets by mouth once daily 270 tablet 3   raloxifene  (EVISTA ) 60 MG tablet Take 60 mg by mouth every other day. In the morning     No facility-administered medications prior to visit.     Review of Systems:   Constitutional:   No  weight loss, night sweats,  Fevers, chills, +fatigue, or  lassitude.  HEENT:   No headaches,  Difficulty swallowing,  Tooth/dental problems, or  Sore throat,                No sneezing, itching, ear ache, nasal congestion, post nasal drip,   CV:  No chest pain,  Orthopnea, PND, swelling in lower extremities, anasarca, dizziness, palpitations, syncope.   GI  No heartburn, indigestion, abdominal pain, nausea, vomiting, diarrhea, change in bowel habits, loss of appetite, bloody stools.   Resp: No shortness of breath with exertion or at rest.  No excess mucus, no productive cough,  No non-productive cough,  No coughing up of blood.  No change in color of mucus.  No wheezing.  No chest wall deformity  Skin: no rash or lesions.  GU: no dysuria, change in color of urine, no urgency or frequency.  No flank pain, no hematuria   MS:  No joint pain or swelling.  No decreased range of motion.  No back pain.    Physical Exam  BP 124/72   Pulse (!) 53   Ht 5' 8 (1.727 m)   Wt 172 lb (78 kg)   SpO2 96% Comment: RA  BMI 26.15 kg/m   GEN: A/Ox3; pleasant , NAD, well nourished    HEENT:  [place]/AT,  , NOSE-clear, THROAT-clear, no lesions, no postnasal drip or exudate noted.   NECK:  Supple w/ fair ROM; no JVD; normal carotid impulses w/o bruits; no thyromegaly or nodules palpated; no lymphadenopathy.    RESP  Clear  P & A; w/o, wheezes/ rales/ or rhonchi. no accessory muscle use, no dullness to percussion  CARD:  RRR, no m/r/g, no peripheral edema, pulses intact, no cyanosis or clubbing.  GI:   Soft & nt; nml bowel sounds; no  organomegaly or masses detected.   Musco: Warm bil, no deformities or joint swelling noted.   Neuro: alert, no focal deficits noted.    Skin: Warm, no lesions or rashes    Lab Results:  CBC  BNP  ProBNP No results found for: PROBNP  Imaging: No results found.  Administration History     None          Latest Ref Rng & Units 01/11/2023    9:53 AM  PFT Results  FVC-Pre L 2.77   FVC-Predicted Pre % 92   FVC-Post L 2.86   FVC-Predicted Post % 95   Pre FEV1/FVC % % 70   Post FEV1/FCV % % 73   FEV1-Pre L 1.93   FEV1-Predicted Pre % 86   FEV1-Post L 2.09   DLCO uncorrected ml/min/mmHg 16.34   DLCO UNC% % 78   DLVA Predicted % 92   TLC L 5.40   TLC % Predicted % 95   RV % Predicted % 96     No results found for: NITRICOXIDE      Assessment & Plan:   No problem-specific Assessment & Plan notes found for this encounter.  Assessment and Plan    Moderate sleep apnea   Moderate sleep apnea with 26 apneic events per hou Current CPAP therapy is suboptimal due to mask leakage and improper fit, . Order a CPAP titration study at [facility] to optimize pressure settings and mask fit.  Plan  Patient Instructions  Set up CPAP titration study.  Wear CPAP all night long for at least 6hrs each night .  Work on healthy weight.  Follow up in 3-4 months and As needed         Madelin Stank, NP 12/26/2023

## 2023-12-29 DIAGNOSIS — G4733 Obstructive sleep apnea (adult) (pediatric): Secondary | ICD-10-CM | POA: Diagnosis not present

## 2023-12-30 ENCOUNTER — Encounter (HOSPITAL_COMMUNITY): Payer: Self-pay

## 2023-12-30 ENCOUNTER — Ambulatory Visit (HOSPITAL_COMMUNITY)
Admission: RE | Admit: 2023-12-30 | Discharge: 2023-12-30 | Disposition: A | Discharge status: Home / Self Care | Source: Ambulatory Visit | Attending: Internal Medicine | Admitting: Internal Medicine

## 2023-12-30 DIAGNOSIS — Z1231 Encounter for screening mammogram for malignant neoplasm of breast: Secondary | ICD-10-CM | POA: Insufficient documentation

## 2024-01-16 DIAGNOSIS — M25551 Pain in right hip: Secondary | ICD-10-CM | POA: Diagnosis not present

## 2024-01-29 DIAGNOSIS — G4733 Obstructive sleep apnea (adult) (pediatric): Secondary | ICD-10-CM | POA: Diagnosis not present

## 2024-01-31 DIAGNOSIS — M810 Age-related osteoporosis without current pathological fracture: Secondary | ICD-10-CM | POA: Diagnosis not present

## 2024-01-31 DIAGNOSIS — R7303 Prediabetes: Secondary | ICD-10-CM | POA: Diagnosis not present

## 2024-01-31 DIAGNOSIS — E782 Mixed hyperlipidemia: Secondary | ICD-10-CM | POA: Diagnosis not present

## 2024-02-05 ENCOUNTER — Encounter (HOSPITAL_BASED_OUTPATIENT_CLINIC_OR_DEPARTMENT_OTHER): Admitting: Internal Medicine

## 2024-02-06 ENCOUNTER — Other Ambulatory Visit (HOSPITAL_COMMUNITY): Payer: Self-pay | Admitting: Internal Medicine

## 2024-02-06 DIAGNOSIS — I10 Essential (primary) hypertension: Secondary | ICD-10-CM | POA: Diagnosis not present

## 2024-02-06 DIAGNOSIS — M7989 Other specified soft tissue disorders: Secondary | ICD-10-CM | POA: Diagnosis not present

## 2024-02-06 DIAGNOSIS — M810 Age-related osteoporosis without current pathological fracture: Secondary | ICD-10-CM | POA: Diagnosis not present

## 2024-02-06 DIAGNOSIS — E782 Mixed hyperlipidemia: Secondary | ICD-10-CM | POA: Diagnosis not present

## 2024-02-06 DIAGNOSIS — R945 Abnormal results of liver function studies: Secondary | ICD-10-CM | POA: Diagnosis not present

## 2024-02-06 DIAGNOSIS — R7303 Prediabetes: Secondary | ICD-10-CM | POA: Diagnosis not present

## 2024-02-06 DIAGNOSIS — I25810 Atherosclerosis of coronary artery bypass graft(s) without angina pectoris: Secondary | ICD-10-CM | POA: Diagnosis not present

## 2024-02-06 DIAGNOSIS — I480 Paroxysmal atrial fibrillation: Secondary | ICD-10-CM | POA: Diagnosis not present

## 2024-02-06 DIAGNOSIS — I119 Hypertensive heart disease without heart failure: Secondary | ICD-10-CM | POA: Diagnosis not present

## 2024-02-06 DIAGNOSIS — Z955 Presence of coronary angioplasty implant and graft: Secondary | ICD-10-CM | POA: Diagnosis not present

## 2024-02-06 DIAGNOSIS — I6529 Occlusion and stenosis of unspecified carotid artery: Secondary | ICD-10-CM | POA: Diagnosis not present

## 2024-02-06 DIAGNOSIS — F411 Generalized anxiety disorder: Secondary | ICD-10-CM | POA: Diagnosis not present

## 2024-02-29 DIAGNOSIS — G4733 Obstructive sleep apnea (adult) (pediatric): Secondary | ICD-10-CM | POA: Diagnosis not present

## 2024-03-03 ENCOUNTER — Ambulatory Visit (HOSPITAL_COMMUNITY)
Admission: RE | Admit: 2024-03-03 | Discharge: 2024-03-03 | Disposition: A | Discharge status: Home / Self Care | Source: Ambulatory Visit | Attending: Internal Medicine | Admitting: Internal Medicine

## 2024-03-03 DIAGNOSIS — M8589 Other specified disorders of bone density and structure, multiple sites: Secondary | ICD-10-CM | POA: Diagnosis not present

## 2024-03-03 DIAGNOSIS — M810 Age-related osteoporosis without current pathological fracture: Secondary | ICD-10-CM | POA: Diagnosis not present

## 2024-03-03 DIAGNOSIS — Z780 Asymptomatic menopausal state: Secondary | ICD-10-CM | POA: Diagnosis not present

## 2024-03-06 ENCOUNTER — Ambulatory Visit (HOSPITAL_BASED_OUTPATIENT_CLINIC_OR_DEPARTMENT_OTHER): Attending: Adult Health | Admitting: Internal Medicine

## 2024-03-06 DIAGNOSIS — G4733 Obstructive sleep apnea (adult) (pediatric): Secondary | ICD-10-CM | POA: Insufficient documentation

## 2024-03-11 ENCOUNTER — Other Ambulatory Visit: Payer: Self-pay | Admitting: Cardiology

## 2024-03-15 DIAGNOSIS — G4733 Obstructive sleep apnea (adult) (pediatric): Secondary | ICD-10-CM | POA: Diagnosis not present

## 2024-03-15 NOTE — Procedures (Signed)
 [facility] [address] [place], [place] [address] Tel: [phone]   Fax: [phone]  Titration Interpretation  Patient Name:  Monica Stafford, Monica Stafford Study Date:  03/06/2024 Referring Physician:  MADELIN PARRETT [phone]) %%startinterp%% Indications for Polysomnography The patient is a 83 year-old Female who is 5' 8 and weighs 170.0 lbs. Her BMI equals 26.1.  A full night titration treatment study was performed. Baseline HST 11/03/22- AHI(4%) 26/hr, desaturation to 75%, body weight 180 lbs.  Medication  No Data.   Polysomnogram Data A full night polysomnogram recorded the standard physiologic parameters including EEG, EOG, EMG, EKG, nasal and oral airflow.  Respiratory parameters of chest and abdominal movements were recorded with Respiratory Inductance Plethysmography belts.  Oxygen  saturation was recorded by pulse oximetry.   Sleep Architecture The total recording time of the polysomnogram was 380.1 minutes.  The total sleep time was 230.0 minutes.  The patient spent 1.5% of total sleep time in Stage N1, 91.1% in Stage N2, 0.0% in Stages N3, and 7.4% in REM.  Sleep latency was 18.1 minutes.  REM latency was 281.5 minutes.  Sleep Efficiency was 60.5%.  Wake after Sleep Onset time was 132.0 minutes.  Titration Summary The patient was titrated at pressures ranging from 5* cm/H20 with supplemental oxygen  at - up to 13* cm/H20 with supplemental oxygen  at -.  The last pressure used in the study was 13* cm/H20 with supplemental oxygen  at -.  Respiratory Events The polysomnogram revealed a presence of 9 obstructive, - central, and 2 mixed apneas resulting in an Apnea index of 2.9 events per hour.  There were 34 hypopneas (>=3% desaturation and/or arousal) resulting in an Apnea\Hypopnea Index (AHI >=3% desaturation and/or arousal) of 11.7 events per hour.  There were 32 hypopneas (>=4% desaturation) resulting in an Apnea\Hypopnea Index (AHI >=4% desaturation)  of 11.2 events per hour.  There were - Respiratory Effort Related Arousals resulting in a RERA index of - events per hour. The Respiratory Disturbance Index is 11.7 events per hour.  The snore index was - events per hour.  Mean oxygen  saturation was 94.6%.  The lowest oxygen  saturation during sleep was 86.0%.  Time spent <=88% oxygen  saturation was 1.8 minutes (0.5%).  Limb Activity There were - limb movements recorded.  Of this total, - were classified as PLMs.  Of the PLMs, - were associated with arousals.  The Limb Movement index was - per hour while the PLM index was - per hour.  Cardiac Summary The average pulse rate was 59.9 bpm.  The minimum pulse rate was 46.0 bpm while the maximum pulse rate was 92.0 bpm.  Cardiac rhythm was abnormal- Atrial fibrillation with PVCs.l.  Comments: CPAP titrated to 13 cwp, but events recurred above 10 cwp, possibly from over-titration.  At 10 cwp, residual Ahi(4%) 0/hr, saturation nadir 89%, mean 93%.  Diagnosis:   Recommendations: Suggest autopap 5-15 or fixed 10 cwp. Patient wore a medium ResMed AirTouch F-20 mask with heated humidification.   This study was personally reviewed and electronically signed by: REGGY SALT MD Accredited Board Certified in Sleep Medicine Date/Time: 03/15/24   11:21    %%endinterp%%  Titration Report  Patient Name: Monica Stafford, Monica Stafford Study Date: 03/06/2024  Date of Birth: [birth year] Study Type: CPAP Titration  Age: 31 year MRN #: [id number]  Sex: Female Interpreting Physician: SALT REGGY, [id number]  Height: 5' 8 Referring Physician: MADELIN STANK [phone])  Weight: 170.0 lbs Recording Tech: Prentice Coombe RPSGT RST  BMI: 26.1 Scoring  Tech: Prentice Coombe RPSGT RST  ESS: 4 Neck Size: 15  Mask Type Resmed Air-Touch F20 Final Pressure: 13cmH20  Mask Size: Medium Supplemental O2: N/A   Study Overview  Lights Off: 10:54:47 PM  Count Index  Lights On: 05:14:55 AM Awakenings: 9 2.3  Time in Bed: 380.1 min. Arousals: 1 0.3   Total Sleep Time: 230.0 min. AHI (>=3% Desat and/or Ar.): 45 11.7   Sleep Efficiency: 60.5% AHI (>=4% Desat): 43 11.2   Sleep Latency: 18.1 min. Limb Movements: - -  Wake After Sleep Onset: 132.0 min. Snore: - -  REM Latency from Sleep Onset: 281.5 min. Desaturations: 58 15.1     Minimum SpO2 TST: 86.0%    Sleep Architecture  % of Time in Bed Stages Time (mins) % Sleep Time  Wake 151.0   Stage N1 3.5 1.5%  Stage N2 209.5 91.1%  Stage N3 0.0 0.0%  REM 17.0 7.4%   Arousal Summary   NREM REM Sleep Index  Respiratory Arousals - - - -  PLM Arousals - - - -  Isolated Limb Movement Arousals - - - -  Snore Arousals - - - -  Spontaneous Arousals 1 - 1 0.3  Total 1 - 1 0.3   Limb Movement Summary   Count Index  Isolated Limb Movements - -  Periodic Limb Movements (PLMs) - -  Total Limb Movements - -    Respiratory Summary   By Sleep Stage By Body Position Total   NREM REM Supine Non-Supine   Time (min) 213.0 17.0 126.5 103.5 230.0         Obstructive Apnea 1 8 9  - 9  Mixed Apnea - 2 2 - 2  Central Apnea - - - - -  Total Apneas 1 10 11  - 11  Total Apnea Index 0.3 35.3 5.2 - 2.9         Hypopneas (>=3% Desat and/or Ar.) 30 4 34 - 34  AHI (>=3% Desat and/or Ar.) 8.7 49.4 21.3 - 11.7         Hypopneas (>=4% Desat) 28 4 32 - 32  AHI (>=4% Desat) 8.2 49.4 20.4 - 11.2          RERAs - - - - -  RERA Index - - - - -         RDI 8.7 49.4 21.3 - 11.7     Respiratory Event Durations   Apnea Hypopnea   NREM REM NREM REM  Average (seconds) 19.0 33.3 43.6 42.1  Maximum (seconds) 19.0 47.6 64.3 53.2    Oxygen  Saturation Summary   Wake NREM REM TST TIB  Average SpO2 95.2% 94.2% 94.5% 94.2% 94.6%  Minimum SpO2 78.0% 88.0% 86.0% 86.0% 78.0%  Maximum SpO2 100.0% 98.0% 98.0% 98.0% 100.0%   Oxygen  Saturation Distribution  Range (%) Time in range (min) Time in range (%)  90.0 - 100.0 343.2 98.2%  80.0 - 90.0 6.0 1.7%  70.0 - 80.0 0.2 0.0%  60.0 - 70.0 - -  50.0 - 60.0  - -  0.0 - 50.0 - -  Time Spent <=88% SpO2  Range (%) Time in range (min) Time in range (%)  0.0 - 88.0 1.8 0.5%      Count Index  Desaturations 58 15.1    Cardiac Summary   Wake NREM REM Sleep Total  Average Pulse Rate (BPM) 61.3 59.2 59.7 59.2 59.9  Minimum Pulse Rate (BPM) 47.0 46.0 46.0 46.0 46.0  Maximum Pulse Rate (BPM) 92.0  76.0 72.0 76.0 92.0   Pulse Rate Distribution:  Range (bpm) Time in range (min) Time in range (%)  0.0 - 40.0 - -  40.0 - 60.0 200.6 57.0%  60.0 - 80.0 151.3 43.0%  80.0 - 100.0 0.0 0.0%  100.0 - 120.0 - -  120.0 - 140.0 - -  140.0 - 200.0 - -   Titration Summary  PAP Device PAP Level O2 Level Time (min) Wake (min) NREM (min) REM (min) Sleep Eff% OA# CA# MA# Hyp# (>=3%) AHI (>=3%) Hyp# (>=4%) AHI (>=%4) RERA RDI SpO2 <=88% (min) Min SpO2 Mean SpO2 Ar. Index  - Off - 0.5 0.5 0.0 0.0 0.0%               CPAP 5 - 56.0 19.5 36.5 0.0 65.2% - - - - - -  - -  -  0.0 94.0 95.2 -  CPAP 7 - 230.5 113.5 117.0 0.0 50.8% 1 - - 6 3.6 6  3.6 -  3.6  0.0 90.0 94.4 0.5  CPAP 8 - 38.0 7.0 14.0 17.0 81.6% 8 - 2 12 42.6 12  42.6 -  42.6  0.9 86.0 94.0 -  CPAP 9 - 4.0 0.0 4.0 0.0 100.0% - - - - - -  - -  -  0.0 91.0 93.8 -  CPAP 10 - 13.5 0.0 13.5 0.0 100.0% - - - - - -  - -  -  0.0 89.0 93.0 -  CPAP 12 - 23.5 0.0 23.5 0.0 100.0% - - - 12 30.6 10  25.5 -  30.6  0.0 89.0 92.6 -  CPAP 13 - 15.0 10.5 4.5 0.0 30.0% - - - 4 53.3 4  53.3 -  53.3  0.0 90.0 94.8 -    Hypnograms                           Technologist Comments  STUDY TYPE= CPAP MASK- RESMED AIR-TOUCH F20 FFM SIZE= MEDIUM PRESSURE= 13CMH20 OXYGEN =N/A  PATIENT PRESENT HERE FOR CPAP TITRATION. HOOK UP WAS DONE PER PROTOCOL. PRESSURE WAS STARTED ON [id number]. PRESSURE WAS INCREASED AS NEEDED. PATIENT HAD DIFFICULTY FALLING ASLEEP. NO SIGNIFICANT PLMs.  PATIENT SLEPT LEFT, RIGHT AND SUPINE. ABNORMAL ECG WERE SEEN . COPIES ATTACHED. OPTIMAL PRESSURE WAS REACHED AT 13CMH20. PATIENT  TOLERATED THE MASK. BATHROOM VISIT WAS DONE 5X. RESMED AIR-TOUCH F20 WAS USED FOR THIS STUDY.                          Reggy Salt Diplomate, Biomedical engineer of Sleep Medicine  ELECTRONICALLY SIGNED ON:  03/15/2024, 11:09 AM [facility] PH: [phone]   FX: [phone] ACCREDITED BY THE [employer]ICAN ACADEMY OF SLEEP MEDICINE

## 2024-03-17 ENCOUNTER — Telehealth: Payer: Self-pay | Admitting: Adult Health

## 2024-03-17 DIAGNOSIS — G4733 Obstructive sleep apnea (adult) (pediatric): Secondary | ICD-10-CM

## 2024-03-17 NOTE — Telephone Encounter (Signed)
 Sleep study shows optimal control on CPAP 10cmH2O. Please send order for CPAP pressure change Wear CPAP At bedtime  all night long.  Follow up in 3 months and As needed

## 2024-03-18 NOTE — Telephone Encounter (Signed)
 Called patient informed of results will place order.NFN

## 2024-03-30 DIAGNOSIS — G4733 Obstructive sleep apnea (adult) (pediatric): Secondary | ICD-10-CM | POA: Diagnosis not present

## 2024-04-03 ENCOUNTER — Ambulatory Visit: Admitting: Cardiology

## 2024-05-11 DIAGNOSIS — G4733 Obstructive sleep apnea (adult) (pediatric): Secondary | ICD-10-CM | POA: Diagnosis not present

## 2024-05-12 ENCOUNTER — Encounter: Admitting: Pulmonary Disease

## 2024-05-22 DIAGNOSIS — R7303 Prediabetes: Secondary | ICD-10-CM | POA: Diagnosis not present

## 2024-05-22 DIAGNOSIS — E782 Mixed hyperlipidemia: Secondary | ICD-10-CM | POA: Diagnosis not present

## 2024-05-22 DIAGNOSIS — M810 Age-related osteoporosis without current pathological fracture: Secondary | ICD-10-CM | POA: Diagnosis not present

## 2024-06-08 ENCOUNTER — Other Ambulatory Visit: Payer: Self-pay | Admitting: Cardiology

## 2024-06-08 DIAGNOSIS — I4819 Other persistent atrial fibrillation: Secondary | ICD-10-CM

## 2024-06-08 NOTE — Telephone Encounter (Signed)
 Prescription refill request for Eliquis  received. Indication: AF Last office visit: 09/10/23  JINNY Ross MD Scr:  0.77 on 05/22/24  Epic Age: 83 Weight: 76.2kg  Based on above findings Eliquis  5mg  twice daily is the appropriate dose.  Refill approved.

## 2024-06-09 ENCOUNTER — Encounter: Admitting: Pulmonary Disease

## 2024-06-12 ENCOUNTER — Ambulatory Visit: Admitting: Cardiology

## 2024-06-24 NOTE — Progress Notes (Signed)
 "  Established Patient Pulmonology Office Visit   Subjective:  Patient ID: Monica Stafford, female    DOB: 15-Sep-1940  MRN: [id number]  CC:  Chief Complaint  Patient presents with   Sleep Apnea    Compliance / dry mouth / some cough    HPI  Monica Stafford is an 84 y/o F with hx of moderate OSA who presents for follow up.  Last seen 12/2023, had suboptimal efficacy on CPAP with elevated residual AHI. NP in office recommended CPAP titration. This was not completed.  Discussed the use of AI scribe software for clinical note transcription with the patient, who gave verbal consent to proceed.  History of Present Illness Monica Stafford is an 84 year old female with sleep apnea who presents for follow-up regarding CPAP use.  She has a history of sleep apnea diagnosed a few years ago and has been using a CPAP machine consistently, sleeping with it for eight hours every night. Initially, she had difficulty finding a suitable mask but now uses one that covers both her nose and mouth. She reports using her CPAP machine every night, with only occasional nights of non-use. She receives periodic supplies for her CPAP, including a new hose and mask, and reports that the current mask works for her. She feels rested upon waking but notes persistent daytime fatigue.  She experiences daytime tiredness and takes naps, especially after working. 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 Her sleep schedule includes going to bed between 11 PM and 12 AM and waking up around 7 AM. She takes about 10-15 minutes to fall asleep and wakes once or twice at night to use the bathroom. She takes half a Xanax  in the evening for 'fidgets' in her legs.  She has a history of coronary artery disease with six blockages, for which she underwent open-heart surgery with four bypasses and a carotid procedure. She experiences shortness of breath when rushing or walking  uphill, but not to the extent experienced before her heart surgery. No history of smoking and has had several breathing tests, with a past diagnosis of obstructive lung disease. She does not use inhalers and manages her symptoms by slowing down and taking deep breaths.  She takes medication for high blood pressure and a diuretic for swelling, noting that her legs swell after working. She has a history of COPD, postural collapsed lung, and pneumonia, for which she was on antibiotics for almost a month. No asthma and does not feel the need for an inhaler.      No data to display          ROS   Current Medications[1]      Objective:  BP (!) 133/58   Pulse (!) 53   Ht 5' 10 (1.778 m)   Wt 171 lb (77.6 kg)   SpO2 97% Comment: ra  BMI 24.54 kg/m  Wt Readings from Last 3 Encounters:  06/25/24 171 lb (77.6 kg)  03/06/24 (!) 577 lb (261.7 kg)  12/26/23 172 lb (78 kg)   BMI Readings from Last 3 Encounters:  06/25/24 24.54 kg/m  03/06/24 82.79 kg/m  12/26/23 26.15 kg/m   SpO2 Readings from Last 3 Encounters:  06/25/24 97%  12/26/23 96%  09/10/23 98%    Physical Exam General: NAD, alert, WD, WN Eyes: PERRL, no scleral icterus ENMT: oropharynx clear, good dentition, no oral lesions, mallampati score III Skin: warm, intact, no rashes  Neck: JVD flat, ROM and lymph node assessment normal CV: RRR, no MRG, nl S1 and S2, no peripheral edema Resp: clear to auscultation bilaterally, no wheezes, rales, or rhonchi, normal effort, no clubbing/cyanosis Neuro: Awake alert oriented to person place time and situation  Diagnostic Review:  Last CBC Lab Results  Component Value Date   WBC 6.6 12/07/2022   HGB 13.5 12/07/2022   HCT 39.8 12/07/2022   MCV 103.9 (H) 12/07/2022   MCH 35.2 (H) 12/07/2022   RDW 13.7 12/07/2022   PLT 127 (L) 12/07/2022   Last metabolic panel Lab Results  Component Value Date   GLUCOSE 109 (H) 12/07/2022   NA 138 12/07/2022   K 3.2 (L) 12/07/2022    CL 102 12/07/2022   CO2 29 12/07/2022   BUN 19 12/07/2022   CREATININE 0.75 12/07/2022   GFRNONAA >60 12/07/2022   CALCIUM  8.6 (L) 12/07/2022   PROT 6.8 03/01/2022   ALBUMIN 3.6 03/01/2022   BILITOT 0.9 03/01/2022   ALKPHOS 81 03/01/2022   AST 44 (H) 03/01/2022   ALT 30 03/01/2022   ANIONGAP 7 12/07/2022    TEST/EVENTS :  HST 11/2022 mod AHI 26/h, low sat 75%  Echo 08/2022 normal LV function, dilated RA, normal RV function, RVSP 45 mm 07/2008 NPSG - mod OSA, AHI 15, mostly REM related ,low sat 79% , BMI 27  PFT 12/2022: mild obstructive defect with mild reduction in diffusion capacity  CPAP titration 02/2024: Comments: CPAP titrated to 13 cwp, but events recurred above 10 cwp, possibly from over-titration.  At 10 cwp, residual Ahi(4%) 0/hr, saturation nadir 89%, mean 93%.   Diagnosis:    Recommendations: Suggest autopap 5-15 or fixed 10 cwp. Patient wore a medium ResMed AirTouch F-20 mask with heated humidification.    Assessment & Plan:   Assessment & Plan Obstructive sleep apnea (adult) (pediatric) Obstructive sleep apnea Well-managed with CPAP therapy. No significant compliance issues (93% ccompliance) on CPAP at 10 cm H2O. Improved mask fitting issues per patient. Residual AHI is 8.8 - Continue CPAP therapy nightly for at least six hours. - Schedule annual follow-up for CPAP management unless issues arise. Chronic obstructive pulmonary disease, unspecified COPD type (HCC) Patient is a non smoker but has been exposed to wood burning stove during childhood and some second hand smoking. She does exhibit some asthma triggers such as strong smells and pollen. I suggested starting an inhaler but patient declined and thought that her symptoms are due to deconditioning. She wants to pursue exercise instead. I encouraged the latter.  No orders of the defined types were placed in this encounter.  I spent 30 minutes reviewing patient's chart including prior consultant notes, imaging, and  PFTs as well as face-to-face with the patient, over half in discussion of the diagnosis and the importance of compliance with the treatment plan.  Return in about 1 year (around 06/25/2025).   Devinne Epstein, MD     [1]  Current Outpatient Medications:    acetaminophen  (TYLENOL ) 500 MG tablet, Take 1,000 mg by mouth 3 (three) times daily as needed for mild pain or moderate pain., Disp: , Rfl:    alendronate  (FOSAMAX ) 70 MG tablet, Take 70 mg by mouth every Monday., Disp: , Rfl:    ALPRAZolam  (XANAX ) 1 MG tablet, Take 0.25-0.5 mg by mouth 3 (three) times daily as needed for anxiety., Disp: , Rfl:    amLODipine  (NORVASC ) 5 MG tablet, Take 1 tablet by mouth once daily, Disp: 90 tablet, Rfl: 2   apixaban  (  ELIQUIS ) 5 MG TABS tablet, Take 1 tablet by mouth twice daily, Disp: 180 tablet, Rfl: 1   atorvastatin  (LIPITOR) 40 MG tablet, Take 40 mg by mouth every evening., Disp: , Rfl:    Calcium  Carbonate-Vitamin D  600-400 MG-UNIT tablet, Take 1-2 tablets by mouth daily. Take 1 tablet in the morning & take 2 tablets in the evening., Disp: , Rfl:    estradiol  (ESTRACE ) 0.1 MG/GM vaginal cream, Place 1 g vaginally every other day as needed (irritation.)., Disp: , Rfl:    fish oil-omega-3 fatty acids 1000 MG capsule, Take 1 g by mouth every evening., Disp: , Rfl:    furosemide  (LASIX ) 20 MG tablet, TAKE 1 TABLET BY MOUTH ONCE DAILY (MAY  TAKE  AN  ADDITIONAL  TAB  AS  NEEDED  FOR  SWELLING,  WEIGHT  GAIN), Disp: 180 tablet, Rfl: 2   lisinopril  (ZESTRIL ) 10 MG tablet, Take 10 mg by mouth 2 (two) times daily., Disp: , Rfl:    metoprolol  tartrate (LOPRESSOR ) 25 MG tablet, Take 1/2 (one-half) tablet by mouth twice daily, Disp: 90 tablet, Rfl: 1   Multiple Vitamin (MULTIVITAMIN WITH MINERALS) TABS, Take 1 tablet by mouth in the morning., Disp: , Rfl:    nitroGLYCERIN  (NITROSTAT ) 0.4 MG SL tablet, Place 1 tablet (0.4 mg total) under the tongue every 5 (five) minutes as needed for chest pain., Disp: 25 tablet, Rfl:  1   oxyCODONE  (ROXICODONE ) 5 MG immediate release tablet, Take 1 tablet (5 mg total) by mouth every 6 (six) hours as needed for severe pain., Disp: 15 tablet, Rfl: 0   Polyethyl Glycol-Propyl Glycol (SYSTANE) 0.4-0.3 % SOLN, Place 1 drop into both eyes 4 (four) times daily as needed (dry/irritated eyes.)., Disp: , Rfl:    potassium chloride  SA (KLOR-CON  M) 20 MEQ tablet, Take 3 tablets by mouth once daily, Disp: 270 tablet, Rfl: 3   raloxifene  (EVISTA ) 60 MG tablet, Take 60 mg by mouth every other day. In the morning, Disp: , Rfl:   "

## 2024-06-25 ENCOUNTER — Encounter: Payer: Self-pay | Admitting: Pulmonary Disease

## 2024-06-25 ENCOUNTER — Ambulatory Visit (INDEPENDENT_AMBULATORY_CARE_PROVIDER_SITE_OTHER): Admitting: Pulmonary Disease

## 2024-06-25 VITALS — BP 133/58 | HR 53 | Ht 70.0 in | Wt 171.0 lb

## 2024-06-25 DIAGNOSIS — G4733 Obstructive sleep apnea (adult) (pediatric): Secondary | ICD-10-CM

## 2024-06-25 DIAGNOSIS — J449 Chronic obstructive pulmonary disease, unspecified: Secondary | ICD-10-CM | POA: Diagnosis not present

## 2024-06-25 NOTE — Patient Instructions (Signed)
" °  VISIT SUMMARY: You came in for a follow-up regarding your sleep apnea and CPAP use. You have been using your CPAP machine consistently and feel rested upon waking, though you still experience some daytime fatigue.  YOUR PLAN: OBSTRUCTIVE SLEEP APNEA: Your sleep apnea is well-managed with CPAP therapy, and there are no significant compliance issues. -Continue using your CPAP machine every night for at least six hours. -Schedule an annual follow-up for CPAP management unless any issues arise.   Contains text generated by [employer]. "

## 2024-06-25 NOTE — Assessment & Plan Note (Signed)
 Obstructive sleep apnea Well-managed with CPAP therapy. No significant compliance issues (93% ccompliance) on CPAP at 10 cm H2O. Improved mask fitting issues per patient. Residual AHI is 8.8 - Continue CPAP therapy nightly for at least six hours. - Schedule annual follow-up for CPAP management unless issues arise.

## 2024-07-06 ENCOUNTER — Encounter: Payer: Self-pay | Admitting: Cardiology

## 2024-07-06 ENCOUNTER — Ambulatory Visit: Attending: Cardiology | Admitting: Cardiology

## 2024-07-06 VITALS — BP 132/60 | HR 59 | Ht 68.0 in | Wt 164.2 lb

## 2024-07-06 DIAGNOSIS — I4819 Other persistent atrial fibrillation: Secondary | ICD-10-CM

## 2024-07-06 DIAGNOSIS — I2510 Atherosclerotic heart disease of native coronary artery without angina pectoris: Secondary | ICD-10-CM

## 2024-07-06 DIAGNOSIS — I6523 Occlusion and stenosis of bilateral carotid arteries: Secondary | ICD-10-CM

## 2024-07-06 DIAGNOSIS — D6869 Other thrombophilia: Secondary | ICD-10-CM

## 2024-07-06 NOTE — Progress Notes (Signed)
 "     Clinical Summary Monica Stafford is a 84 y.o.female seen today for follow up of the following medical problems.      1. CAD - history of CABG in 2002 (LIMA-LAD,RIMA-RCA,SVG-OM1,SVG-D2). LVEF 69% by LVgram in 2002  - 03/2017 admit with chest pain. Troponins negative. Nuclear stress without ischemia, low risk - 03/2017 echo LVEF 60-65%, grade I diastolic dysfunction - no beta blocker due to bradycardia     02/2022 nuclear stress ordered for DOE, abnormal referred for cath.   02/2022 cath  Successful two-vessel PCI of the mid RCA and the SVG to OM.     08/2022 echo: LVEF 55-60%, no WMAs, indet diastolic, severe BAE, mod pulm HTN     - no recent chest pains, no SOB/DOE - compliant with meds EKG today afib, rates 49. No acute ischemic changes  - no recent chest pains - compliant with meds     2. Carotid stenosis - history of left CEA 12/2000 -01/2019 carotid US  RICA 1-39%, <50% placque LICA with patent CEA - followed by vascualr    08/2020 RICA 1-39%, LICA patent CEA  08/2021 1-39% bilateral disease 10/2022 carotid US : RICA no significant disease, LICA patent CEA     3. Hyperlipidemia -01/2023 TC 116 TG 96 HDL 44 LDL 54 - 07/2023 TC 878 TG 893 HDL 45 LDL 56 - 05/2024 TC 115 TG 62 HDL 53 LDL 48     4.HTN -she is compliant with meds - edema on norvasc  10, has tolerated 5   - compliant with meds   5. Afib   - rare palpitations -no bleeding on eliquis .      6. LE edema - started after norvasc  was increased to 10mg  daily back in 10/2015 - norvasc  was decreased to 5mg  daily which improved swelling - mild chronic edema. Previously a fairly benign echo, suspect likely component of venous insufficiency   - some swelling at times. Takes lasix  20mg  daily, at times will take additional prn.  - overall controlled.     7. Syncope - presented 03/2017 with chest pain and syncope - syncope episode occurred after becoming nauseated - labs suggested she was dehydrated. She had also  taken a nitroglycerin  while standing around that time.    - no recent issues      8.OSA - followed by pulmonary   9.COPD - followed by pulmonary     SH:  Works at [employer] 3 days a week.  Son passed away in 05/19/25 from COVID.  She recently got married Past Medical History:  Diagnosis Date   Arthritis    back  all over   CAD (coronary artery disease)    a. s/p CABG in 2002 with LIMA-LAD, RIMA-RCA, SVG-OM1, SVG-D2 b. low-risk NST in 03/2017 c. cath in 02/2022 with DES to mid-RCA and DES to SVG-OM   Hyperlipidemia    Hypertension    PAF (paroxysmal atrial fibrillation) (HCC)    Peripheral vascular disease    Pneumonia     50 years  ago   Sleep apnea      study but dont wear   mask     Allergies[1]   Current Outpatient Medications  Medication Sig Dispense Refill   acetaminophen  (TYLENOL ) 500 MG tablet Take 1,000 mg by mouth 3 (three) times daily as needed for mild pain or moderate pain.     alendronate  (FOSAMAX ) 70 MG tablet Take 70 mg by mouth every Monday.     ALPRAZolam  (XANAX ) 1 MG tablet  Take 0.25-0.5 mg by mouth 3 (three) times daily as needed for anxiety.     amLODipine  (NORVASC ) 5 MG tablet Take 1 tablet by mouth once daily 90 tablet 2   apixaban  (ELIQUIS ) 5 MG TABS tablet Take 1 tablet by mouth twice daily 180 tablet 1   atorvastatin  (LIPITOR) 40 MG tablet Take 40 mg by mouth every evening.     Calcium  Carbonate-Vitamin D  600-400 MG-UNIT tablet Take 1-2 tablets by mouth daily. Take 1 tablet in the morning & take 2 tablets in the evening.     estradiol  (ESTRACE ) 0.1 MG/GM vaginal cream Place 1 g vaginally every other day as needed (irritation.).     fish oil-omega-3 fatty acids 1000 MG capsule Take 1 g by mouth every evening.     furosemide  (LASIX ) 20 MG tablet TAKE 1 TABLET BY MOUTH ONCE DAILY (MAY  TAKE  AN  ADDITIONAL  TAB  AS  NEEDED  FOR  SWELLING,  WEIGHT  GAIN) 180 tablet 2   lisinopril  (ZESTRIL ) 10 MG tablet Take 10 mg by mouth 2 (two) times daily.      metoprolol  tartrate (LOPRESSOR ) 25 MG tablet Take 1/2 (one-half) tablet by mouth twice daily 90 tablet 1   Multiple Vitamin (MULTIVITAMIN WITH MINERALS) TABS Take 1 tablet by mouth in the morning.     nitroGLYCERIN  (NITROSTAT ) 0.4 MG SL tablet Place 1 tablet (0.4 mg total) under the tongue every 5 (five) minutes as needed for chest pain. 25 tablet 1   oxyCODONE  (ROXICODONE ) 5 MG immediate release tablet Take 1 tablet (5 mg total) by mouth every 6 (six) hours as needed for severe pain. 15 tablet 0   Polyethyl Glycol-Propyl Glycol (SYSTANE) 0.4-0.3 % SOLN Place 1 drop into both eyes 4 (four) times daily as needed (dry/irritated eyes.).     potassium chloride  SA (KLOR-CON  M) 20 MEQ tablet Take 3 tablets by mouth once daily 270 tablet 3   raloxifene  (EVISTA ) 60 MG tablet Take 60 mg by mouth every other day. In the morning     No current facility-administered medications for this visit.     Past Surgical History:  Procedure Laterality Date   ABDOMINAL HYSTERECTOMY  1998   [facility]   CARDIAC CATHETERIZATION     CATARACT EXTRACTION W/PHACO  12/10/2011   Procedure: CATARACT EXTRACTION PHACO AND INTRAOCULAR LENS PLACEMENT (IOC);  Surgeon: Cherene Mania, MD;  Location: [facility];  Service: Ophthalmology;  Laterality: Right;  CDE:10.40   CATARACT EXTRACTION W/PHACO  01/07/2012   Procedure: CATARACT EXTRACTION PHACO AND INTRAOCULAR LENS PLACEMENT (IOC);  Surgeon: Cherene Mania, MD;  Location: [facility];  Service: Ophthalmology;  Laterality: Left;  CDE 8.38   CORONARY ARTERY BYPASS GRAFT  12/23/2000   [facility]   CORONARY STENT INTERVENTION N/A 03/15/2022   Procedure: CORONARY STENT INTERVENTION;  Surgeon: Dann Candyce RAMAN, MD;  Location: [facility] INVASIVE CV LAB;  Service: Cardiovascular;  Laterality: N/A;   LAPAROSCOPIC APPENDECTOMY N/A 02/27/2016   Procedure: APPENDECTOMY LAPAROSCOPIC;  Surgeon: Oneil Budge, MD;  Location: [facility];  Service: General;  Laterality: N/A;   LEFT HEART CATH AND  CORS/GRAFTS ANGIOGRAPHY N/A 03/15/2022   Procedure: LEFT HEART CATH AND CORS/GRAFTS ANGIOGRAPHY;  Surgeon: Dann Candyce RAMAN, MD;  Location: [facility] INVASIVE CV LAB;  Service: Cardiovascular;  Laterality: N/A;     Allergies[2]    Family History  Problem Relation Age of Onset   Heart disease Father        before age 10   Heart  attack Father    Parkinson's disease Sister    Colitis Sister    COPD Maternal Uncle        smoked     Social History Ms. Mcdougle reports that she has never smoked. She has been exposed to tobacco smoke. She has never used smokeless tobacco. Ms. Licciardi reports no history of alcohol  use.    Physical Examination Today's Vitals   07/06/24 0918  BP: 132/60  Pulse: (!) 59  SpO2: 99%  Weight: 164 lb 3.2 oz (74.5 kg)  Height: 5' 8 (1.727 m)   Body mass index is 24.97 kg/m.  Gen: resting comfortably, no acute distress HEENT: no scleral icterus, pupils equal round and reactive, no palptable cervical adenopathy,  CV: irreg no mrg, no jvd Resp: Clear to auscultation bilaterally GI: abdomen is soft, non-tender, non-distended, normal bowel sounds, no hepatosplenomegaly MSK: extremities are warm, no edema.  Skin: warm, no rash Neuro:  no focal deficits Psych: appropriate affect   Diagnostic Studies 12/2000 Cath PROCEDURES PERFORMED: 1. Left heart catheterization. 2. Selective coronary arteriography. 3. Selective left ventriculography. 4. Subclavian angiography.   DESCRIPTION OF PROCEDURE:  The procedure was performed from the right femoral artery using 6-French catheters.  She tolerated the procedure well without complications.  She did have elevated blood pressures.  She was given two doses of labetalol  20 mg IV, plus an additional dose of 5 mg of intravenous metoprolol .  There were no complications from the procedure and she was taken to the holding area in satisfactory clinical condition.   HEMODYNAMIC DATA:  Central aortic pressure was  190/83.  LV pressure was 190/20.  There was no gradient on pullback across the aortic valve.   ANGIOGRAPHIC DATA: 1. Left ventriculography was performed in the RAO projection.  Overall    ventricular systolic function was normal.  No segmental abnormalities    or contraction were identified.  There did not appear to be significant    mitral regurgitation.  Ejection fraction was calculated at 69%. 2. Subclavian angiography revealed a patent subclavian artery, as well as    an internal mammary artery.  The internal mammary appeared to be suitable    for grafting. 3. The left main coronary artery was free of critical disease. 4. The left anterior descending artery coursed to the apex where it wrapped    the apical tip.  The LAD had about a 50% area of narrowing beyond the    origin of the first diagonal.  The first diagonal itself had about 50%    narrowing.  Distal to this, there was a segmental area of plaquing leading    into an approximate 90% stenosis just after the large anterolateral Monica Stafford.    This anterolateral Monica Stafford itself was a large caliber vessel that had about    80% proximal narrowing.  Both vessels appeared to be suitable for grafting. 5. The circumflex artery had mild plaquing of approximately 20% to 30% prior    to the first marginal.  The first marginal itself had about 50% ostial and    then a mid 60% stenosis.  This vessel also appeared to be suitable for    grafting. 6. The right coronary artery was a large dominant vessel providing a moderate    sized posterior descending and a very large posterolateral Monica Stafford.  The    right coronary artery had a 20% to 30% narrowing in the proximal vessel.    In the mid vessel was a focal 90% stenosis.  CONCLUSIONS: 1. Preserved left ventricular function. 2. Patent internal mammary artery. 3. 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 4. Moderate stenosis of the circumflex artery. 5. High-grade stenosis  of the mid right coronary artery that is suitable    for intervention.   Cath report  2002 recs:  The patient has three-vessel disease.  The right is suitable for intervention, but the LAD is a complex lesion with a high risk of target vessel revascularization characterized by a long lesion and small caliber vessel.  Based on these risk factors and the multivessel disease, we likely will recommend revascularization surgery.  I will notify her primary care physician and we will get a surgical consultation.DD:  12/20/00 TD:  12/20/00 Job: [id number]      03/2017 Nuclear stress IMPRESSION: 1. Tiny fixed perfusion defect at the apex.   2. Normal left ventricular wall motion.   3. Left ventricular ejection fraction 64%   4. Non invasive risk stratification*: Low risk.   03/2017 echo Study Conclusions   - Left ventricle: The cavity size was normal. Wall thickness was   increased in a pattern of mild LVH. Systolic function was normal.   The estimated ejection fraction was in the range of 60% to 65%.   Chordal SAM noted. Wall motion was normal; there were no regional   wall motion abnormalities. Doppler parameters are consistent with   abnormal left ventricular relaxation (grade 1 diastolic   dysfunction). - Aortic valve: There was no stenosis. - Mitral valve: Mildly calcified annulus. - Right ventricle: The cavity size was normal. Systolic function   was normal. - Tricuspid valve: Peak RV-RA gradient (S): 20 mm Hg. - Pulmonary arteries: PA peak pressure: 23 mm Hg (S). - Inferior vena cava: The vessel was normal in size. The   respirophasic diameter changes were in the normal range (>= 50%),   consistent with normal central venous pressure.   Impressions:   - Normal LV size with mild LV hypertrophy. EF 60-65%. Normal RV   size and systolic function. No significant valvular   abnormalities.    Assessment and Plan    1. CAD -no significant symptoms, can continue  current meds   2. Carotid stenosis - repeat carotid US    3. Hyperlipidemia - at goal, continue current meds   4. HTN -bp is at goal, continue current meds   5. Afib/acquired thrombophilia -no symptoms, continue current meds   6. LE edema - likely secondary to venous insufficiecny -controlled, continue lasix  and compression stockings.    F/u 6 months    Dorn PHEBE Ross, M.D.     [1]  Allergies Allergen Reactions   Sulfonamide Derivatives Itching  [2]  Allergies Allergen Reactions   Sulfonamide Derivatives Itching   "

## 2024-07-06 NOTE — Patient Instructions (Signed)
 Medication Instructions:  Your physician recommends that you continue on your current medications as directed. Please refer to the Current Medication list given to you today.  *If you need a refill on your cardiac medications before your next appointment, please call your pharmacy*  Lab Work: None If you have labs (blood work) drawn today and your tests are completely normal, you will receive your results only by: MyChart Message (if you have MyChart) OR A paper copy in the mail If you have any lab test that is abnormal or we need to change your treatment, we will call you to review the results.  Testing/Procedures: Your physician has requested that you have a carotid duplex. This test is an ultrasound of the carotid arteries in your neck. It looks at blood flow through these arteries that supply the brain with blood. Allow one hour for this exam. There are no restrictions or special instructions.   Follow-Up: At [facility], you and your health needs are our priority.  As part of our continuing mission to provide you with exceptional heart care, our providers are all part of one team.  This team includes your primary Cardiologist (physician) and Advanced Practice Providers or APPs (Physician Assistants and Nurse Practitioners) who all work together to provide you with the care you need, when you need it.  Your next appointment:   6 month(s)  Provider:   You may see Alvan Carrier, MD or one of the following Advanced Practice Providers on your designated Care Team:   Laymon Qua, PA-C  [place], [place] Olivia Pavy, [place]     We recommend signing up for the patient portal called MyChart.  Sign up information is provided on this After Visit Summary.  MyChart is used to connect with patients for Virtual Visits (Telemedicine).  Patients are able to view lab/test results, encounter notes, upcoming appointments, etc.  Non-urgent messages can be sent to your provider as  well.   To learn more about what you can do with MyChart, go to [email or web address].   Other Instructions

## 2024-07-16 ENCOUNTER — Ambulatory Visit (HOSPITAL_COMMUNITY)
Admission: RE | Admit: 2024-07-16 | Discharge: 2024-07-16 | Disposition: A | Discharge status: Home / Self Care | Source: Ambulatory Visit | Attending: Cardiology | Admitting: Cardiology

## 2024-07-16 DIAGNOSIS — I6523 Occlusion and stenosis of bilateral carotid arteries: Secondary | ICD-10-CM | POA: Insufficient documentation

## 2024-07-17 ENCOUNTER — Ambulatory Visit: Payer: Self-pay | Admitting: Cardiology
